# Patient Record
Sex: Female | Born: 1965 | Race: Black or African American | Hispanic: No | Marital: Married | Smoking: Never smoker
Health system: Southern US, Community
[De-identification: ages and names within clinical notes are randomized; demographics above are authoritative.]

## PROBLEM LIST (undated history)

## (undated) DIAGNOSIS — Z8711 Personal history of peptic ulcer disease: Secondary | ICD-10-CM

## (undated) DIAGNOSIS — M1990 Unspecified osteoarthritis, unspecified site: Secondary | ICD-10-CM

## (undated) DIAGNOSIS — K5900 Constipation, unspecified: Secondary | ICD-10-CM

## (undated) DIAGNOSIS — Z91018 Allergy to other foods: Secondary | ICD-10-CM

## (undated) DIAGNOSIS — Z460 Encounter for fitting and adjustment of spectacles and contact lenses: Secondary | ICD-10-CM

## (undated) DIAGNOSIS — Z9109 Other allergy status, other than to drugs and biological substances: Secondary | ICD-10-CM

## (undated) DIAGNOSIS — J45909 Unspecified asthma, uncomplicated: Secondary | ICD-10-CM

## (undated) DIAGNOSIS — I319 Disease of pericardium, unspecified: Secondary | ICD-10-CM

## (undated) DIAGNOSIS — Z8679 Personal history of other diseases of the circulatory system: Secondary | ICD-10-CM

## (undated) DIAGNOSIS — E739 Lactose intolerance, unspecified: Secondary | ICD-10-CM

## (undated) DIAGNOSIS — K829 Disease of gallbladder, unspecified: Secondary | ICD-10-CM

## (undated) DIAGNOSIS — M75100 Unspecified rotator cuff tear or rupture of unspecified shoulder, not specified as traumatic: Secondary | ICD-10-CM

## (undated) DIAGNOSIS — J302 Other seasonal allergic rhinitis: Secondary | ICD-10-CM

## (undated) DIAGNOSIS — M2550 Pain in unspecified joint: Secondary | ICD-10-CM

## (undated) DIAGNOSIS — S83209A Unspecified tear of unspecified meniscus, current injury, unspecified knee, initial encounter: Secondary | ICD-10-CM

## (undated) DIAGNOSIS — Z8719 Personal history of other diseases of the digestive system: Secondary | ICD-10-CM

## (undated) DIAGNOSIS — M1711 Unilateral primary osteoarthritis, right knee: Secondary | ICD-10-CM

## (undated) HISTORY — DX: Personal history of other diseases of the digestive system: Z87.19

## (undated) HISTORY — DX: Unspecified rotator cuff tear or rupture of unspecified shoulder, not specified as traumatic: M75.100

## (undated) HISTORY — PX: OTHER SURGICAL HISTORY: SHX169

## (undated) HISTORY — PX: BREAST BIOPSY: SHX20

## (undated) HISTORY — DX: Disease of gallbladder, unspecified: K82.9

## (undated) HISTORY — DX: Personal history of peptic ulcer disease: Z87.11

## (undated) HISTORY — DX: Lactose intolerance, unspecified: E73.9

## (undated) HISTORY — DX: Constipation, unspecified: K59.00

## (undated) HISTORY — DX: Allergy to other foods: Z91.018

## (undated) HISTORY — PX: REDUCTION MAMMAPLASTY: SUR839

## (undated) HISTORY — DX: Unspecified asthma, uncomplicated: J45.909

## (undated) HISTORY — DX: Unspecified osteoarthritis, unspecified site: M19.90

## (undated) HISTORY — DX: Pain in unspecified joint: M25.50

## (undated) HISTORY — DX: Disease of pericardium, unspecified: I31.9

---

## 1990-02-24 HISTORY — PX: CHOLECYSTECTOMY: SHX55

## 1996-02-25 HISTORY — PX: APPENDECTOMY: SHX54

## 1997-09-30 ENCOUNTER — Emergency Department (HOSPITAL_COMMUNITY): Admission: EM | Admit: 1997-09-30 | Discharge: 1997-09-30 | Payer: Self-pay | Admitting: Emergency Medicine

## 1998-03-14 ENCOUNTER — Encounter: Admission: RE | Admit: 1998-03-14 | Discharge: 1998-06-12 | Payer: Self-pay

## 1998-06-12 ENCOUNTER — Ambulatory Visit (HOSPITAL_COMMUNITY): Admission: RE | Admit: 1998-06-12 | Discharge: 1998-06-12 | Payer: Self-pay | Admitting: Internal Medicine

## 1998-06-12 ENCOUNTER — Encounter: Payer: Self-pay | Admitting: Internal Medicine

## 1998-06-26 ENCOUNTER — Encounter
Admission: RE | Admit: 1998-06-26 | Discharge: 1998-08-10 | Payer: Self-pay | Admitting: Physical Medicine & Rehabilitation

## 1999-03-13 ENCOUNTER — Other Ambulatory Visit: Admission: RE | Admit: 1999-03-13 | Discharge: 1999-03-13 | Payer: Self-pay | Admitting: Obstetrics & Gynecology

## 1999-03-18 ENCOUNTER — Inpatient Hospital Stay (HOSPITAL_COMMUNITY): Admission: RE | Admit: 1999-03-18 | Discharge: 1999-03-20 | Payer: Self-pay | Admitting: Obstetrics & Gynecology

## 1999-03-18 ENCOUNTER — Encounter (INDEPENDENT_AMBULATORY_CARE_PROVIDER_SITE_OTHER): Payer: Self-pay | Admitting: Specialist

## 1999-03-18 HISTORY — PX: VAGINAL HYSTERECTOMY: SUR661

## 2000-04-17 ENCOUNTER — Other Ambulatory Visit: Admission: RE | Admit: 2000-04-17 | Discharge: 2000-04-17 | Payer: Self-pay | Admitting: Internal Medicine

## 2000-05-19 ENCOUNTER — Other Ambulatory Visit: Admission: RE | Admit: 2000-05-19 | Discharge: 2000-05-19 | Payer: Self-pay | Admitting: Obstetrics & Gynecology

## 2000-07-15 ENCOUNTER — Encounter: Payer: Self-pay | Admitting: Occupational Medicine

## 2000-07-15 ENCOUNTER — Encounter: Admission: RE | Admit: 2000-07-15 | Discharge: 2000-07-15 | Payer: Self-pay | Admitting: Occupational Medicine

## 2001-09-20 ENCOUNTER — Encounter: Admission: RE | Admit: 2001-09-20 | Discharge: 2001-09-20 | Payer: Self-pay | Admitting: Internal Medicine

## 2001-09-20 ENCOUNTER — Encounter: Payer: Self-pay | Admitting: Internal Medicine

## 2001-09-24 ENCOUNTER — Encounter (INDEPENDENT_AMBULATORY_CARE_PROVIDER_SITE_OTHER): Payer: Self-pay | Admitting: *Deleted

## 2001-09-24 ENCOUNTER — Encounter: Payer: Self-pay | Admitting: Internal Medicine

## 2001-09-24 ENCOUNTER — Encounter: Admission: RE | Admit: 2001-09-24 | Discharge: 2001-09-24 | Payer: Self-pay | Admitting: Internal Medicine

## 2001-11-04 ENCOUNTER — Emergency Department (HOSPITAL_COMMUNITY): Admission: EM | Admit: 2001-11-04 | Discharge: 2001-11-04 | Payer: Self-pay | Admitting: Emergency Medicine

## 2001-11-05 ENCOUNTER — Other Ambulatory Visit: Admission: RE | Admit: 2001-11-05 | Discharge: 2001-11-05 | Payer: Self-pay | Admitting: Obstetrics & Gynecology

## 2002-10-11 ENCOUNTER — Encounter: Admission: RE | Admit: 2002-10-11 | Discharge: 2002-10-27 | Payer: Self-pay | Admitting: Orthopaedic Surgery

## 2003-01-23 ENCOUNTER — Other Ambulatory Visit: Admission: RE | Admit: 2003-01-23 | Discharge: 2003-01-23 | Payer: Self-pay | Admitting: Obstetrics & Gynecology

## 2003-01-27 ENCOUNTER — Encounter: Admission: RE | Admit: 2003-01-27 | Discharge: 2003-01-27 | Payer: Self-pay | Admitting: Occupational Medicine

## 2003-10-16 ENCOUNTER — Emergency Department (HOSPITAL_COMMUNITY): Admission: EM | Admit: 2003-10-16 | Discharge: 2003-10-16 | Payer: Self-pay | Admitting: Emergency Medicine

## 2004-03-21 ENCOUNTER — Encounter: Admission: RE | Admit: 2004-03-21 | Discharge: 2004-03-21 | Payer: Self-pay | Admitting: Internal Medicine

## 2004-06-14 ENCOUNTER — Emergency Department (HOSPITAL_COMMUNITY): Admission: EM | Admit: 2004-06-14 | Discharge: 2004-06-14 | Payer: Self-pay | Admitting: Emergency Medicine

## 2004-10-08 ENCOUNTER — Ambulatory Visit (HOSPITAL_COMMUNITY): Admission: RE | Admit: 2004-10-08 | Discharge: 2004-10-08 | Payer: Self-pay | Admitting: Family Medicine

## 2006-02-16 ENCOUNTER — Encounter: Admission: RE | Admit: 2006-02-16 | Discharge: 2006-02-16 | Payer: Self-pay | Admitting: Internal Medicine

## 2007-01-28 ENCOUNTER — Encounter: Admission: RE | Admit: 2007-01-28 | Discharge: 2007-01-28 | Payer: Self-pay | Admitting: Internal Medicine

## 2009-04-22 ENCOUNTER — Ambulatory Visit (HOSPITAL_COMMUNITY)
Admission: RE | Admit: 2009-04-22 | Discharge: 2009-04-22 | Payer: Self-pay | Source: Home / Self Care | Admitting: Family Medicine

## 2009-04-26 ENCOUNTER — Observation Stay (HOSPITAL_COMMUNITY): Admission: EM | Admit: 2009-04-26 | Discharge: 2009-04-27 | Payer: Self-pay | Admitting: Emergency Medicine

## 2009-04-26 ENCOUNTER — Encounter: Payer: Self-pay | Admitting: Family Medicine

## 2009-04-26 ENCOUNTER — Ambulatory Visit: Payer: Self-pay | Admitting: Family Medicine

## 2009-04-26 HISTORY — PX: TRANSTHORACIC ECHOCARDIOGRAM: SHX275

## 2010-03-17 ENCOUNTER — Encounter: Payer: Self-pay | Admitting: Orthopedic Surgery

## 2010-05-20 LAB — COMPREHENSIVE METABOLIC PANEL
ALT: 24 U/L (ref 0–35)
Alkaline Phosphatase: 66 U/L (ref 39–117)
BUN: 13 mg/dL (ref 6–23)
CO2: 28 mEq/L (ref 19–32)
Chloride: 103 mEq/L (ref 96–112)
GFR calc Af Amer: >60 mL/min (ref >60)
GFR calc non Af Amer: >60 mL/min (ref >60)
Glucose, Bld: 88 mg/dL (ref 70–99)
Potassium: 4 mEq/L (ref 3.5–5.1)
Sodium: 137 mEq/L (ref 135–145)
Total Bilirubin: 0.6 mg/dL (ref 0.3–1.2)
Total Protein: 8.1 g/dL (ref 6.0–8.3)

## 2010-05-20 LAB — DIFFERENTIAL
Eosinophils Relative: 1 % (ref 0–5)
Lymphs Abs: 2.7 10*3/uL (ref 0.7–4.0)
Monocytes Relative: 5 % (ref 3–12)
Neutro Abs: 10.1 10*3/uL — ABNORMAL HIGH (ref 1.7–7.7)

## 2010-05-20 LAB — POCT CARDIAC MARKERS: CKMB, poc: <1 ng/mL — ABNORMAL LOW (ref 1.0–8.0)

## 2010-05-20 LAB — CK TOTAL AND CKMB (NOT AT ARMC)
CK, MB: 0.8 ng/mL (ref 0.3–4.0)
Total CK: 101 U/L (ref 7–177)

## 2010-05-20 LAB — CARDIAC PANEL(CRET KIN+CKTOT+MB+TROPI)
CK, MB: 0.6 ng/mL (ref 0.3–4.0)
CK, MB: 0.9 ng/mL (ref 0.3–4.0)
Total CK: 89 U/L (ref 7–177)
Troponin I: 0.01 ng/mL (ref 0.00–0.06)

## 2010-05-20 LAB — CBC
HCT: 38 % (ref 36.0–46.0)
MCHC: 34.4 g/dL (ref 30.0–36.0)
WBC: 13.6 10*3/uL — ABNORMAL HIGH (ref 4.0–10.5)

## 2010-05-20 LAB — TROPONIN I: Troponin I: 0.01 ng/mL (ref 0.00–0.06)

## 2010-05-20 LAB — SEDIMENTATION RATE: Sed Rate: 24 mm/hr — ABNORMAL HIGH (ref 0–22)

## 2010-07-12 NOTE — Op Note (Signed)
[facility]  Patient:    Dawn [name]                     MRN: [id number] Proc. Date: 03/18/99 Adm. Date:  [id number] Attending:  Lars Pinks                           Operative Report  PREOPERATIVE DIAGNOSIS:  Menorrhagia, dysmenorrhea, myoma uteri.  POSTOPERATIVE DIAGNOSIS:  Menorrhagia, dysmenorrhea, myoma uteri.  OPERATION:  Transvaginal hysterectomy.  SURGEON:  Richard D. Arlyce Dice, M.D.  ASSISTANT:  Luvenia Redden, M.D.  ANESTHESIA:  General endotracheal.  ESTIMATED BLOOD LOSS:  100 cc.  FINDINGS:  Myoma uteri.  INDICATIONS:  The patient is a 45 year old gravida 2, para 2 who has experienced approximately a one-year history of prolonged and painful periods which failed o respond to oral contraceptives or nonsteroidal anti-inflammatory drugs.  DESCRIPTION OF PROCEDURE:  The patient was taken to the operating room and placed in the supine position and general endotracheal anesthesia was induced.  She was then placed in dorsal lithotomy position.  The vagina and perineum were prepped and draped in sterile fashion.  A weighted speculum was placed in the vagina, the cervix was grasped with a Jacobs tenaculum and 0.5% Marcaine with 1:200,000 dilution of epinephrine was then administered in the paracervical tissues.  The  cervix was circumcised.  The mucosa was dissected free.  The anterior cul-de-sac was entered sharply.  The posterior cul-de-sac was defined but was not entered.  The uterosacral ligaments and the base of the cardinal ligaments were clamped, ut and ligated.  At this point the posterior cul-de-sac was entered easily and the  remainder of the cardinal ligaments were clamped, cut and ligated.  The inferior portion of the broad ligament was clamped, cut and ligated.  At this point the uterus was delivered posteriorly.  The remainder of the broad ligament and the utero-ovarian anastomosis was then clamped and  cut and doubly ligated bilaterally. The posterior cuff was closed with a running interlocking Vicryl 1 suture.  The  cul-de-sac was closed with a chromic suture.  The small areas of bleeding along the broad ligament were closed with figure-of-eight chromic sutures.  The peritoneum was closed with a pursestring suture.  The anterior vaginal cuff was closed with interrupted sutures.  At this point the operative field was dry and the procedure was terminated.  The bladder was catheterized and clear urine was obtained. DD:  03/18/99 TD:  03/19/99 Job: [id number]

## 2011-01-29 ENCOUNTER — Ambulatory Visit: Payer: Self-pay | Admitting: Family Medicine

## 2011-02-21 ENCOUNTER — Ambulatory Visit (INDEPENDENT_AMBULATORY_CARE_PROVIDER_SITE_OTHER): Payer: BC Managed Care – PPO | Admitting: Family Medicine

## 2011-02-21 DIAGNOSIS — N760 Acute vaginitis: Secondary | ICD-10-CM

## 2011-02-28 ENCOUNTER — Encounter (INDEPENDENT_AMBULATORY_CARE_PROVIDER_SITE_OTHER): Payer: BC Managed Care – PPO | Admitting: Family Medicine

## 2011-02-28 DIAGNOSIS — E669 Obesity, unspecified: Secondary | ICD-10-CM

## 2011-02-28 DIAGNOSIS — Z0000 Encounter for general adult medical examination without abnormal findings: Secondary | ICD-10-CM

## 2011-04-25 ENCOUNTER — Other Ambulatory Visit (HOSPITAL_COMMUNITY): Payer: Self-pay | Admitting: Orthopedic Surgery

## 2011-04-25 DIAGNOSIS — M25569 Pain in unspecified knee: Secondary | ICD-10-CM

## 2011-04-28 ENCOUNTER — Ambulatory Visit (HOSPITAL_COMMUNITY)
Admission: RE | Admit: 2011-04-28 | Discharge: 2011-04-28 | Disposition: A | Discharge status: Home / Self Care | Payer: 59 | Source: Ambulatory Visit | Attending: Orthopedic Surgery | Admitting: Orthopedic Surgery

## 2011-04-28 DIAGNOSIS — X58XXXA Exposure to other specified factors, initial encounter: Secondary | ICD-10-CM | POA: Insufficient documentation

## 2011-04-28 DIAGNOSIS — M1710 Unilateral primary osteoarthritis, unspecified knee: Secondary | ICD-10-CM | POA: Insufficient documentation

## 2011-04-28 DIAGNOSIS — M7989 Other specified soft tissue disorders: Secondary | ICD-10-CM | POA: Insufficient documentation

## 2011-04-28 DIAGNOSIS — S83289A Other tear of lateral meniscus, current injury, unspecified knee, initial encounter: Secondary | ICD-10-CM | POA: Insufficient documentation

## 2011-04-28 DIAGNOSIS — M25569 Pain in unspecified knee: Secondary | ICD-10-CM

## 2011-04-30 ENCOUNTER — Other Ambulatory Visit (HOSPITAL_COMMUNITY): Payer: Self-pay

## 2011-06-17 ENCOUNTER — Encounter (HOSPITAL_BASED_OUTPATIENT_CLINIC_OR_DEPARTMENT_OTHER): Payer: Self-pay | Admitting: *Deleted

## 2011-06-17 NOTE — Progress Notes (Signed)
NPO AFTER MN WITH EXCEPTION WATER/ GATORADE UNTIL  0700. ARRIVES AT 1100. NEEDS HG . MAY TAKE TYLENOL IF NEEDED W/ SIP OF WATER.

## 2011-06-20 ENCOUNTER — Encounter (HOSPITAL_BASED_OUTPATIENT_CLINIC_OR_DEPARTMENT_OTHER): Payer: Self-pay | Admitting: Anesthesiology

## 2011-06-20 ENCOUNTER — Ambulatory Visit (HOSPITAL_BASED_OUTPATIENT_CLINIC_OR_DEPARTMENT_OTHER)
Admission: RE | Admit: 2011-06-20 | Discharge: 2011-06-20 | Disposition: A | Discharge status: Home / Self Care | Payer: 59 | Source: Ambulatory Visit | Attending: Orthopedic Surgery | Admitting: Orthopedic Surgery

## 2011-06-20 ENCOUNTER — Encounter (HOSPITAL_BASED_OUTPATIENT_CLINIC_OR_DEPARTMENT_OTHER): Payer: Self-pay

## 2011-06-20 ENCOUNTER — Ambulatory Visit (HOSPITAL_BASED_OUTPATIENT_CLINIC_OR_DEPARTMENT_OTHER): Payer: 59 | Admitting: Anesthesiology

## 2011-06-20 ENCOUNTER — Encounter (HOSPITAL_BASED_OUTPATIENT_CLINIC_OR_DEPARTMENT_OTHER)
Admission: RE | Disposition: A | Discharge status: Home / Self Care | Payer: Self-pay | Source: Ambulatory Visit | Attending: Orthopedic Surgery

## 2011-06-20 DIAGNOSIS — Z79899 Other long term (current) drug therapy: Secondary | ICD-10-CM | POA: Insufficient documentation

## 2011-06-20 DIAGNOSIS — IMO0002 Reserved for concepts with insufficient information to code with codable children: Secondary | ICD-10-CM | POA: Insufficient documentation

## 2011-06-20 DIAGNOSIS — M2240 Chondromalacia patellae, unspecified knee: Secondary | ICD-10-CM | POA: Insufficient documentation

## 2011-06-20 DIAGNOSIS — X58XXXA Exposure to other specified factors, initial encounter: Secondary | ICD-10-CM | POA: Insufficient documentation

## 2011-06-20 DIAGNOSIS — Z8711 Personal history of peptic ulcer disease: Secondary | ICD-10-CM | POA: Insufficient documentation

## 2011-06-20 DIAGNOSIS — M6580 Other synovitis and tenosynovitis, unspecified site: Secondary | ICD-10-CM | POA: Insufficient documentation

## 2011-06-20 HISTORY — DX: Other allergy status, other than to drugs and biological substances: Z91.09

## 2011-06-20 HISTORY — DX: Personal history of peptic ulcer disease: Z87.11

## 2011-06-20 HISTORY — DX: Personal history of other diseases of the circulatory system: Z86.79

## 2011-06-20 HISTORY — PX: KNEE ARTHROSCOPY: SHX127

## 2011-06-20 HISTORY — DX: Personal history of other diseases of the digestive system: Z87.19

## 2011-06-20 HISTORY — DX: Other seasonal allergic rhinitis: J30.2

## 2011-06-20 HISTORY — DX: Unspecified tear of unspecified meniscus, current injury, unspecified knee, initial encounter: S83.209A

## 2011-06-20 LAB — POCT HEMOGLOBIN-HEMACUE: Hemoglobin: 13.6 g/dL (ref 12.0–15.0)

## 2011-06-20 SURGERY — ARTHROSCOPY, KNEE
Anesthesia: General | Site: Knee | Laterality: Left | Wound class: Clean

## 2011-06-20 MED ORDER — SODIUM CHLORIDE 0.9 % IR SOLN
Status: DC | PRN
  Administered 2011-06-20: 9000 mL

## 2011-06-20 MED ORDER — FENTANYL CITRATE 0.05 MG/ML IJ SOLN
25.0000 ug | INTRAMUSCULAR | Status: DC | PRN
  Administered 2011-06-20 (×2): 25 ug via INTRAVENOUS

## 2011-06-20 MED ORDER — ONDANSETRON HCL 4 MG/2ML IJ SOLN
INTRAMUSCULAR | Status: DC | PRN
  Administered 2011-06-20: 4 mg via INTRAVENOUS

## 2011-06-20 MED ORDER — MIDAZOLAM HCL 5 MG/5ML IJ SOLN
INTRAMUSCULAR | Status: DC | PRN
  Administered 2011-06-20: 2 mg via INTRAVENOUS

## 2011-06-20 MED ORDER — BUPIVACAINE-EPINEPHRINE 0.25% -1:200000 IJ SOLN
INTRAMUSCULAR | Status: DC | PRN
  Administered 2011-06-20: 30 mL

## 2011-06-20 MED ORDER — MELOXICAM 7.5 MG PO TABS: 7.5000 mg | ORAL_TABLET | Freq: Two times a day (BID) | ORAL | Status: DC | PRN

## 2011-06-20 MED ORDER — CHLORHEXIDINE GLUCONATE 4 % EX LIQD: 60.0000 mL | Freq: Once | CUTANEOUS | Status: DC

## 2011-06-20 MED ORDER — LACTATED RINGERS IV SOLN: INTRAVENOUS | Status: DC

## 2011-06-20 MED ORDER — PROPOFOL 10 MG/ML IV EMUL
INTRAVENOUS | Status: DC | PRN
  Administered 2011-06-20: 150 mg via INTRAVENOUS

## 2011-06-20 MED ORDER — CEFAZOLIN SODIUM 1-5 GM-% IV SOLN
1.0000 g | INTRAVENOUS | Status: AC
  Administered 2011-06-20: 1 g via INTRAVENOUS

## 2011-06-20 MED ORDER — ASCRIPTIN 325 MG PO TABS: 1.0000 | ORAL_TABLET | Freq: Every day | ORAL | Status: DC

## 2011-06-20 MED ORDER — METHOCARBAMOL 500 MG PO TABS: 500.0000 mg | ORAL_TABLET | Freq: Four times a day (QID) | ORAL | Status: AC | PRN

## 2011-06-20 MED ORDER — FENTANYL CITRATE 0.05 MG/ML IJ SOLN
INTRAMUSCULAR | Status: DC | PRN
  Administered 2011-06-20 (×5): 50 ug via INTRAVENOUS

## 2011-06-20 MED ORDER — KETOROLAC TROMETHAMINE 30 MG/ML IJ SOLN
INTRAMUSCULAR | Status: DC | PRN
  Administered 2011-06-20: 30 mg via INTRAVENOUS

## 2011-06-20 MED ORDER — PROMETHAZINE HCL 25 MG/ML IJ SOLN: 6.2500 mg | INTRAMUSCULAR | Status: DC | PRN

## 2011-06-20 MED ORDER — DEXAMETHASONE SODIUM PHOSPHATE 4 MG/ML IJ SOLN
INTRAMUSCULAR | Status: DC | PRN
  Administered 2011-06-20: 10 mg via INTRAVENOUS

## 2011-06-20 MED ORDER — HYDROCODONE-ACETAMINOPHEN 5-325 MG PO TABS: 1.0000 | ORAL_TABLET | Freq: Four times a day (QID) | ORAL | Status: AC | PRN

## 2011-06-20 MED ORDER — LACTATED RINGERS IV SOLN
INTRAVENOUS | Status: DC
  Administered 2011-06-20 (×2): via INTRAVENOUS

## 2011-06-20 MED ORDER — LIDOCAINE-EPINEPHRINE (PF) 1 %-1:200000 IJ SOLN
INTRAMUSCULAR | Status: DC | PRN
  Administered 2011-06-20: 15 mL

## 2011-06-20 MED ORDER — LIDOCAINE HCL (CARDIAC) 20 MG/ML IV SOLN
INTRAVENOUS | Status: DC | PRN
  Administered 2011-06-20: 50 mg via INTRAVENOUS

## 2011-06-20 SURGICAL SUPPLY — 28 items
BANDAGE ELASTIC 6 VELCRO ST LF (GAUZE/BANDAGES/DRESSINGS) ×2 IMPLANT
BLADE 4.2CUDA (BLADE) IMPLANT
BLADE CUDA SHAVER 3.5 (BLADE) ×2 IMPLANT
CANISTER SUCT LVC 12 LTR MEDI- (MISCELLANEOUS) ×2 IMPLANT
CANISTER SUCTION 2500CC (MISCELLANEOUS) ×2 IMPLANT
CLOTH BEACON ORANGE TIMEOUT ST (SAFETY) ×2 IMPLANT
DRAPE ARTHROSCOPY W/POUCH 114 (DRAPES) ×2 IMPLANT
DRSG EMULSION OIL 3X3 NADH (GAUZE/BANDAGES/DRESSINGS) ×2 IMPLANT
DRSG PAD ABDOMINAL 8X10 ST (GAUZE/BANDAGES/DRESSINGS) ×1 IMPLANT
DURAPREP 26ML APPLICATOR (WOUND CARE) ×2 IMPLANT
ELECT MENISCUS 165MM 90D (ELECTRODE) IMPLANT
ELECT REM PT RETURN 9FT ADLT (ELECTROSURGICAL)
ELECTRODE REM PT RTRN 9FT ADLT (ELECTROSURGICAL) IMPLANT
GLOVE BIO SURGEON STRL SZ7.5 (GLOVE) ×2 IMPLANT
GOWN PREVENTION PLUS LG XLONG (DISPOSABLE) ×3 IMPLANT
IV NS IRRIG 3000ML ARTHROMATIC (IV SOLUTION) ×3 IMPLANT
KNEE WRAP E Z 3 GEL PACK (MISCELLANEOUS) ×2 IMPLANT
PACK ARTHROSCOPY DSU (CUSTOM PROCEDURE TRAY) ×2 IMPLANT
PACK BASIN DAY SURGERY FS (CUSTOM PROCEDURE TRAY) ×2 IMPLANT
PENCIL BUTTON HOLSTER BLD 10FT (ELECTRODE) IMPLANT
SET ARTHROSCOPY TUBING (MISCELLANEOUS) ×2
SET ARTHROSCOPY TUBING LN (MISCELLANEOUS) ×1 IMPLANT
SPONGE GAUZE 4X4 12PLY (GAUZE/BANDAGES/DRESSINGS) ×2 IMPLANT
SUT ETHILON 4 0 PS 2 18 (SUTURE) ×2 IMPLANT
SYR CONTROL 10ML LL (SYRINGE) ×2 IMPLANT
TOWEL OR 17X24 6PK STRL BLUE (TOWEL DISPOSABLE) ×2 IMPLANT
WAND 90 DEG TURBOVAC W/CORD (SURGICAL WAND) ×1 IMPLANT
WATER STERILE IRR 500ML POUR (IV SOLUTION) ×2 IMPLANT

## 2011-06-20 NOTE — Anesthesia Procedure Notes (Signed)
Procedure Name: LMA Insertion Date/Time: 06/20/2011 1:12 PM Performed by: Norva Pavlov Pre-anesthesia Checklist: Patient identified, Emergency Drugs available, Suction available and Patient being monitored Patient Re-evaluated:Patient Re-evaluated prior to inductionOxygen Delivery Method: Circle System Utilized Preoxygenation: Pre-oxygenation with 100% oxygen Intubation Type: IV induction Ventilation: Mask ventilation without difficulty LMA: LMA inserted LMA Size: 4.0 Number of attempts: 1 Airway Equipment and Method: bite block Placement Confirmation: positive ETCO2 Tube secured with: Tape Dental Injury: Teeth and Oropharynx as per pre-operative assessment

## 2011-06-20 NOTE — H&P (Signed)
  CC- TERILYNN BURESH is a 46 y.o. female who presents with left knee pain.  HPI- . Knee Pain: Patient presents with knee pain involving the  left knee. Onset of the symptoms was several months ago. Inciting event: twisting injury while doing normal activities. Current symptoms include giving out, locking and swelling. Pain is aggravated by going up and down stairs, lateral movements and walking.  Patient has had no prior knee problems. Evaluation to date: plain films: abnormal with moild to moderate medial and patellofemoral joint space narrowing and MRI: abnormal with medial mensical tearing and degenerative chagnes worse in themedial and patellofemoral compartments. Treatment to date: avoidance of offending activity, corticosteroid injection which was not very effective and prescription NSAIDS which are not very effective.  Past Medical History  Diagnosis Date  . Acute meniscal tear of knee LEFT  . Seasonal allergies   . Environmental allergies   . History of gastric ulcer AS TEEN  . 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 Vaginal hysterectomy 03-18-1999  . Cholecystectomy 1992  . Appendectomy 1998  . Bilateral carpal tunnel release 1980'S  . Transthoracic echocardiogram 04-26-2009    NORMAL LVSF/ EF 60-65% / LVDF NORMAL / NO PERICARDIAL EFFUSION    Prior to Admission medications   Medication Sig Start Date End Date Taking? Authorizing Provider  fexofenadine-pseudoephedrine (ALLEGRA-D 24) 180-240 MG per 24 hr tablet Take 1 tablet by mouth daily.   Yes Historical Provider, MD  meloxicam (MOBIC) 7.5 MG tablet Take 7.5 mg by mouth daily.   Yes Historical Provider, MD  acetaminophen (TYLENOL) 650 MG CR tablet Take 650 mg by mouth every 8 (eight) hours as needed.    Historical Provider, MD    soft tissue tenderness over the medial joint line worse than laterally, effusion, reduced  range of motion, collateral ligaments intact  Physical Examination: General appearance - alert, well appearing, and in no distress Chest - clear to auscultation, no wheezes, rales or rhonchi, symmetric air entry Heart - normal rate, regular rhythm, normal S1, S2, no murmurs, rubs, clicks or gallops, normal rate and regular rhythm Musculoskeletal - no joint tenderness, deformity or swelling, abnormal exam of left knee as noted from presentation, medial JLT, effusion Extremities - peripheral pulses normal, no pedal edema, no clubbing or cyanosis   Asessment/Plan--- Left knee medial meniscal tear- - Plan left knee arthroscopy with meniscal debridement. Procedure risks and potential comps discussed with patient who elects to proceed. Goals are decreased pain and increased function with a high likelihood of achieving both

## 2011-06-20 NOTE — Anesthesia Postprocedure Evaluation (Signed)
  Anesthesia Post-op Note  Patient: Dawn [name]  Procedure(s) Performed: Procedure(s) (LRB): ARTHROSCOPY KNEE (Left)  Patient Location: PACU  Anesthesia Type: General  Level of Consciousness: awake and alert   Airway and Oxygen Therapy: Patient Spontanous Breathing  Post-op Pain: mild  Post-op Assessment: Post-op Vital signs reviewed, Patient's Cardiovascular Status Stable, Respiratory Function Stable, Patent Airway and No signs of Nausea or vomiting  Post-op Vital Signs: stable  Complications: No apparent anesthesia complications

## 2011-06-20 NOTE — Anesthesia Preprocedure Evaluation (Addendum)
Anesthesia Evaluation  Patient identified by MRN, date of birth, ID band Patient awake    Reviewed: Allergy & Precautions, H&P , NPO status , Patient's Chart, lab work & pertinent test results  Airway Mallampati: II TM Distance: >3 FB Neck ROM: Full    Dental No notable dental hx. (+) Dental Advisory Given and Teeth Intact   Pulmonary neg pulmonary ROS,  breath sounds clear to auscultation  Pulmonary exam normal       Cardiovascular negative cardio ROS  Rhythm:Regular Rate:Normal     Neuro/Psych negative neurological ROS  negative psych ROS   GI/Hepatic negative GI ROS, Neg liver ROS,   Endo/Other  negative endocrine ROS  Renal/GU negative Renal ROS  negative genitourinary   Musculoskeletal negative musculoskeletal ROS (+)   Abdominal   Peds  Hematology negative hematology ROS (+)   Anesthesia Other Findings   Reproductive/Obstetrics negative OB ROS                          Anesthesia Physical Anesthesia Plan  ASA: I  Anesthesia Plan: General   Post-op Pain Management:    Induction: Intravenous  Airway Management Planned: LMA  Additional Equipment:   Intra-op Plan:   Post-operative Plan: Extubation in OR  Informed Consent: I have reviewed the patients History and Physical, chart, labs and discussed the procedure including the risks, benefits and alternatives for the proposed anesthesia with the patient or authorized representative who has indicated his/her understanding and acceptance.   Dental advisory given  Plan Discussed with: CRNA  Anesthesia Plan Comments:         Anesthesia Quick Evaluation

## 2011-06-20 NOTE — Transfer of Care (Signed)
Immediate Anesthesia Transfer of Care Note  Patient: Dawn [name]  Procedure(s) Performed: Procedure(s) (LRB): ARTHROSCOPY KNEE (Left)  Patient Location: PACU  Anesthesia Type: General  Level of Consciousness: awake, alert  and oriented  Airway & Oxygen Therapy: Patient Spontanous Breathing and Patient connected to face mask oxygen  Post-op Assessment: Report given to PACU RN and Post -op Vital signs reviewed and stable  Post vital signs: Reviewed and stable  Complications: No apparent anesthesia complications

## 2011-06-20 NOTE — Discharge Instructions (Addendum)
Maintain surgical wrap for about 2 days to help reduce swelling then may remove and use ACE wrap as needed for swelling.  Keep wounds dry until follow up  Ice for 20-30 min per hour for pain and swelling as needed particularly for the first 48-72 hours. Post Anesthesia Home Care Instructions  Activity: Get plenty of rest for the remainder of the day. A responsible adult should stay with you for 24 hours following the procedure.  For the next 24 hours, DO NOT: -Drive a car -Advertising copywriter -Drink alcoholic beverages -Take any medication unless instructed by your physician -Make any legal decisions or sign important papers.  Meals: Start with liquid foods such as gelatin or soup. Progress to regular foods as tolerated. Avoid greasy, spicy, heavy foods. If nausea and/or vomiting occur, drink only clear liquids until the nausea and/or vomiting subsides. Call your physician if vomiting continues.  Special Instructions/Symptoms: Your throat may feel dry or sore from the anesthesia or the breathing tube placed in your throat during surgery. If this causes discomfort, gargle with warm salt water. The discomfort should disappear within 24 hours.  Discharge Instructions After Orthopedic Procedures:  *You may feel tired and weak following your procedure. It is recommended that you limit physical activity for the next 24 hours and rest at home for the remainder of today and tomorrow. *No strenuous activity should be started without your doctor's permission.  Elevate the extremity that you had surgery on to a level above your heart. This should continue for 48 hours or as instructed by your doctor.  If you had hand, arm or shoulder surgery you should move your fingers frequently unless otherwise instructed by your doctor.  If you had foot, knee or leg surgery you should wiggle your toes frequently unless otherwise instructed by your doctor.  Follow your doctor's exact instructions for activity  at home. Use your home equipment as instructed. (Crutches, hard shoes, slings etc.)  Limit your activity as instructed by your doctor.  Report to your doctor should any of the following occur: 1. Extreme swelling of your fingers or toes. 2. Inability to wiggle your fingers or toes. 3. Coldness, pale or bluish color in your fingers or toes. 4. Loss of sensation, numbness or tingling of your fingers or toes. 5. Unusual smell or odor from under your dressing or cast. 6. Excessive bleeding or drainage from the surgical site. 7. Pain not relieved by medication your doctor has prescribed for you. 8. Cast or dressing too tight (do not get your dressing or cast wet or put anything under          your dressing or cast.)  *Do not change your dressing unless instructed by your doctor or discharge nurse. Then follow exact instructions.  *Follow labeled instructions for any medications that your doctor may have prescribed for you. *Should any questions or complications develop following your procedure, PLEASE CONTACT YOUR DOCTOR.

## 2011-06-20 NOTE — Brief Op Note (Signed)
06/20/2011  [facility] surgical center  2:00 PM  PATIENT:  Dawn [name]  46 y.o. female  PRE-OPERATIVE DIAGNOSIS:  left knee meniscal tear  POST-OPERATIVE DIAGNOSIS:  1. left knee medial meniscal tear 2. Grade II thinning of medial compartment cartilage, 3. Grade III-IV chondromalacia PF compartment, 4. Grade III lateral tibial, 5. synovitis  PROCEDURE:  Procedure(s) (LRB): ARTHROSCOPY KNEE (Left), medial partial meniscectomy, 2. Lateral and PF chondroplasty with abrasion lateral trochlea, 3. Synovectomy, limited, 4. Lateral release  SURGEON:  Surgeon(s) and Role:    * Shelda Pal, MD - Primary  PHYSICIAN ASSISTANT: none  ANESTHESIA:   general  EBL:  Total I/O In: 100 [I.V.:100] Out: -   BLOOD ADMINISTERED:none  DRAINS: none   LOCAL MEDICATIONS USED:  MARCAINE  25cc with epi  and XYLOCAINE 15cc portal sites  SPECIMEN:  No Specimen  DISPOSITION OF SPECIMEN:  N/A  COUNTS:  YES  TOURNIQUET:  * No tourniquets in log *  DICTATION: .Other Dictation: Dictation Number [id number]  PLAN OF CARE: Discharge to home after PACU  PATIENT DISPOSITION:  PACU - hemodynamically stable.   Delay start of Pharmacological VTE agent (>24hrs) due to surgical blood loss or risk of bleeding: not applicable

## 2011-06-21 NOTE — Op Note (Signed)
[name], [name]           ACCOUNT NO.:  [id number]  MEDICAL RECORD NO.:  [id number]  LOCATION:                                 FACILITY:  PHYSICIAN:  Dawn Frankel. Charlann [name], M.D.  DATE OF BIRTH:  29-Jul-1965  DATE OF PROCEDURE:  06/20/2011 DATE OF DISCHARGE:                              OPERATIVE REPORT   PREOPERATIVE DIAGNOSIS:  Left knee medial meniscal tear associated with chondromalacia in the patellofemoral medial compartment.  POSTOPERATIVE DIAGNOSIS/FINDINGS: 1. Complex tear into the posterior horn to midbody region of medial     meniscus. 2. Grade 2 chondral thinning in the medial compartment and     predominantly in the tibial plateau service. 3. Grade 3 chondromalacia lateral to the plateau. 4. Grade 3-4 chondromalacia on the patellofemoral compartment     particularly involving lateral patella and lateral femoral     trochlea. 5. Synovitis reactive.  PROCEDURE: 1. Left knee diagnostic and operative arthroscopy with partial medial     meniscectomy. 2. Patellofemoral and lateral compartment chondroplasty with abrasion     chondroplasty carried out in the patellofemoral compartment along     lateral trochlea. 3. Limited synovectomy. 4. Lateral release.  SURGEON:  Dawn Frankel. Charlann [name], M.D.  ASSISTANT:  Surgical team.  ANESTHESIA:  General plus preoperative portal site injection with local plus a postoperative local of Marcaine with epinephrine.  SPECIMENS:  None.  DRAINS:  None.  COMPLICATIONS:  None.  BLOOD LOSS:  None.  INDICATIONS FOR PROCEDURE:  Dawn [name] is a 46 year old female with left knee problems including medial meniscal tear by MRI.  She failed to respond to conservative measures, medications, activity modification, injection of cortisone.  Given the persistence of her current problem and given these findings predominantly of the medial meniscal issue, she wished to proceed with more definitive measures.  Risks of progression of disease,  persistent discomfort related to patellofemoral arthritis, infection, DVT, recurrence of pathology were all discussed, reviewed. Consent was obtained for benefit of pain relief.  PROCEDURE IN DETAIL:  The patient was brought to the operative theater. Once adequate anesthesia, preop antibiotics, Ancef administered, she was positioned supine with the left leg in a leg holder.  The left lower extremity was then prepped and draped in a sterile fashion.  Time-out was performed identifying the patient, planned procedure and extremity.  The standard inferior medial, superior medial, and inferior lateral portals were utilized.  Diagnostic evaluation of the knee revealed the above findings.  Attention was first directed to the medial compartment initially using the 3.5 Cuda shaver.  A large portion of the tear was removed followed by the use of straight and up-biting baskets in order to debride the superior leaf of the tears noted on the posterior horn extending to the midbody region.  The 3.5 Cuda shaver was reintroduced for further removal of meniscal fragments as well as contouring of the remaining meniscus back to a stable level in the midbody junction.  She was noted to have chondral thinning, but no chondral fragments with significant debris in the medial compartment of the knee.  Laterally, she was noted to have these grade 3 changes of the tibial plateau surface.  There was minimum meniscal pathology.  I introduced a 3.5 Cuda shaver and debrided the lateral tibial plateau back to a stable level, noted at the medial aspect of the lateral compartment to be some fairly thin cartilage area.  No evidence of eburnated bone on the weightbearing surface laterally.  Minor meniscal debridement was carried out, but nothing that would be procedurally oriented.  Anteriorly, a fairly extensive but limited synovectomy of the anterior, medial and lateral aspect of the knee was carried out.  This  identified further advanced degenerative changes than what were expected by MRI, noting grade 3 predominant changes over the lateral aspect of the patella.  The lateral trochlear area, however, was grade 3- 4 with exposed bone, which was debrided back to a stable level and abraded with a 3.5 Cuda shaver with some punctate bleeding present.  In addition of this noting the lateral position of the patella, I went ahead and performed a lateral release inserting an ArthroCare wand in order to perform this laterally to prevent bleeding.  The 3.5 Cuda was reinserted to perform final debridements throughout the knee once I was satisfied there was no remaining meniscal fragments, any cartilage fragments and everything was back to a stable level. Following this re-examination, the instrumentation was removed from the knee.  The portal sites were reapproximated using 4-0 nylon.  I injected the knee at the end of the case with 0.25% Marcaine with epinephrine. The knee was wrapped into a sterile bulky Jones dressing.  She was then brought to the recovery room, extubated in stable condition, tolerating the procedure well.  I reviewed these findings with the family.  I will see her back in the office for routine followup in 10 days.  We will try to set her up with Physical Therapy for strengthening, particularly focus on VMO quad strengthening as well as perhaps patellar taping and bracing for functional activity.     Dawn [name], M.D.    MDO/MEDQ  D:  06/20/2011  T:  06/21/2011  Job:  [id number]

## 2011-06-23 ENCOUNTER — Encounter (HOSPITAL_BASED_OUTPATIENT_CLINIC_OR_DEPARTMENT_OTHER): Payer: Self-pay | Admitting: Orthopedic Surgery

## 2011-06-26 ENCOUNTER — Encounter (HOSPITAL_BASED_OUTPATIENT_CLINIC_OR_DEPARTMENT_OTHER): Payer: Self-pay

## 2011-07-03 ENCOUNTER — Ambulatory Visit: Payer: 59 | Attending: Orthopedic Surgery | Admitting: Physical Therapy

## 2011-07-03 DIAGNOSIS — M25569 Pain in unspecified knee: Secondary | ICD-10-CM | POA: Insufficient documentation

## 2011-07-03 DIAGNOSIS — IMO0001 Reserved for inherently not codable concepts without codable children: Secondary | ICD-10-CM | POA: Insufficient documentation

## 2011-07-03 DIAGNOSIS — M25669 Stiffness of unspecified knee, not elsewhere classified: Secondary | ICD-10-CM | POA: Insufficient documentation

## 2011-07-03 DIAGNOSIS — R269 Unspecified abnormalities of gait and mobility: Secondary | ICD-10-CM | POA: Insufficient documentation

## 2011-07-04 ENCOUNTER — Ambulatory Visit: Payer: 59 | Admitting: Physical Therapy

## 2011-07-07 ENCOUNTER — Ambulatory Visit: Payer: 59

## 2011-07-08 ENCOUNTER — Ambulatory Visit: Payer: 59 | Admitting: Physical Therapy

## 2011-07-09 ENCOUNTER — Ambulatory Visit: Payer: 59

## 2011-07-10 ENCOUNTER — Ambulatory Visit: Payer: 59 | Admitting: Physical Therapy

## 2011-07-14 ENCOUNTER — Encounter: Payer: 59 | Admitting: Physical Therapy

## 2011-07-14 ENCOUNTER — Ambulatory Visit: Payer: 59

## 2011-07-16 ENCOUNTER — Ambulatory Visit: Payer: 59

## 2011-07-16 ENCOUNTER — Encounter: Payer: 59 | Admitting: Physical Therapy

## 2011-07-18 ENCOUNTER — Ambulatory Visit: Payer: 59 | Admitting: Physical Therapy

## 2011-07-22 ENCOUNTER — Ambulatory Visit: Payer: 59 | Admitting: Physical Therapy

## 2011-07-24 ENCOUNTER — Encounter: Payer: 59 | Admitting: Physical Therapy

## 2011-07-28 ENCOUNTER — Ambulatory Visit: Payer: 59 | Attending: Orthopedic Surgery | Admitting: Physical Therapy

## 2011-07-28 DIAGNOSIS — IMO0001 Reserved for inherently not codable concepts without codable children: Secondary | ICD-10-CM | POA: Insufficient documentation

## 2011-07-28 DIAGNOSIS — R269 Unspecified abnormalities of gait and mobility: Secondary | ICD-10-CM | POA: Insufficient documentation

## 2011-07-28 DIAGNOSIS — M25569 Pain in unspecified knee: Secondary | ICD-10-CM | POA: Insufficient documentation

## 2011-07-28 DIAGNOSIS — M25669 Stiffness of unspecified knee, not elsewhere classified: Secondary | ICD-10-CM | POA: Insufficient documentation

## 2011-07-30 ENCOUNTER — Ambulatory Visit: Payer: 59 | Admitting: Physical Therapy

## 2011-07-31 ENCOUNTER — Ambulatory Visit: Payer: 59 | Admitting: Physical Therapy

## 2011-08-04 ENCOUNTER — Ambulatory Visit: Payer: 59 | Admitting: Physical Therapy

## 2011-08-06 ENCOUNTER — Ambulatory Visit: Payer: 59 | Admitting: Physical Therapy

## 2011-08-07 ENCOUNTER — Encounter: Payer: 59 | Admitting: Physical Therapy

## 2011-08-11 ENCOUNTER — Encounter: Payer: 59 | Admitting: Physical Therapy

## 2011-08-13 ENCOUNTER — Encounter: Payer: 59 | Admitting: Physical Therapy

## 2011-08-14 ENCOUNTER — Encounter: Payer: 59 | Admitting: Physical Therapy

## 2011-08-18 ENCOUNTER — Ambulatory Visit: Payer: 59

## 2011-08-20 ENCOUNTER — Ambulatory Visit: Payer: 59 | Admitting: Physical Therapy

## 2011-08-21 ENCOUNTER — Ambulatory Visit: Payer: 59 | Admitting: Physical Therapy

## 2011-11-14 ENCOUNTER — Ambulatory Visit: Payer: BC Managed Care – PPO | Admitting: Family Medicine

## 2011-11-19 ENCOUNTER — Ambulatory Visit (INDEPENDENT_AMBULATORY_CARE_PROVIDER_SITE_OTHER): Payer: 59 | Admitting: Family Medicine

## 2011-11-19 ENCOUNTER — Encounter: Payer: Self-pay | Admitting: Family Medicine

## 2011-11-19 VITALS — BP 125/75 | HR 77 | Temp 97.8°F | Resp 16 | Ht 63.5 in | Wt 196.0 lb

## 2011-11-19 DIAGNOSIS — E669 Obesity, unspecified: Secondary | ICD-10-CM

## 2011-11-19 NOTE — Patient Instructions (Addendum)
[facility]-   [address]    [phone]. [facility]-   [id number]  [phone]  I would suggest you contact these centers to see if they may serve your needs re: weight loss.

## 2011-11-25 ENCOUNTER — Encounter: Payer: Self-pay | Admitting: Family Medicine

## 2011-11-25 DIAGNOSIS — M1711 Unilateral primary osteoarthritis, right knee: Secondary | ICD-10-CM

## 2011-11-25 DIAGNOSIS — J309 Allergic rhinitis, unspecified: Secondary | ICD-10-CM | POA: Insufficient documentation

## 2011-11-25 DIAGNOSIS — E669 Obesity, unspecified: Secondary | ICD-10-CM | POA: Insufficient documentation

## 2011-11-25 DIAGNOSIS — Z8679 Personal history of other diseases of the circulatory system: Secondary | ICD-10-CM | POA: Insufficient documentation

## 2011-11-25 HISTORY — DX: Unilateral primary osteoarthritis, right knee: M17.11

## 2011-11-25 NOTE — Progress Notes (Signed)
S: This 46 y.o. AA female is here to discuss weight loss options; she would like to be prescribed weight loss medication "to kick-start" her weight loss effort. She has lost some weight but feels frustrated by lack of progress. She just recently joined a weight loss program and plans to get started with regular exercise. Current activity is limited by joint problems (knees). Otherwise, she denies CP or tightness, cough or SOB, fatigue, palpitations, HA, dizziness or lightheadedness, weakness or syncope.  ROS: As per HPI.  O: Filed Vitals:   11/19/11 1121  BP: 125/75                                         BMI=34.1  Pulse: 77  Temp: 97.8 F (36.6 C)  Resp: 16   GEN: In NAD: WN,WD. HENT: [place]/AT; EOMI with clear conj/sclerae. COR: RRR; no edema. LUNGS: Normal resp rate and effort. NEURO: A&O x 3; CNs intact; otherwise, nonfocal.  A/P: 1. Obesity (BMI 30.0-34.9)    Advised pt that this provider does not prescribe weight loss medications and referred her to [facility] if she is determined to use these products but wants an M.D.'s supervision. I encouraged her to commit to the weight loss program that she will be pursuing and to commit to an exercise program (consider aquatic exercise given her joint discomfort). She understands and will contact clinic for more information.

## 2011-12-22 ENCOUNTER — Ambulatory Visit (INDEPENDENT_AMBULATORY_CARE_PROVIDER_SITE_OTHER): Payer: 59 | Admitting: Family Medicine

## 2011-12-22 ENCOUNTER — Ambulatory Visit: Payer: 59

## 2011-12-22 VITALS — BP 158/80 | HR 104 | Resp 28 | Ht 64.0 in | Wt 185.0 lb

## 2011-12-22 DIAGNOSIS — J45909 Unspecified asthma, uncomplicated: Secondary | ICD-10-CM

## 2011-12-22 DIAGNOSIS — R062 Wheezing: Secondary | ICD-10-CM

## 2011-12-22 DIAGNOSIS — R0602 Shortness of breath: Secondary | ICD-10-CM

## 2011-12-22 LAB — POCT CBC
Granulocyte percent: 70.8 %G (ref 37–80)
HCT, POC: 44.3 % (ref 37.7–47.9)
Hemoglobin: 13.6 g/dL (ref 12.2–16.2)
Lymph, poc: 2.4 (ref 0.6–3.4)
MCH, POC: 29.4 pg (ref 27–31.2)
MCHC: 30.7 g/dL — AB (ref 31.8–35.4)
MCV: 95.9 fL (ref 80–97)
MID (cbc): 0.6 (ref 0–0.9)
MPV: 9.7 fL (ref 0–99.8)
POC Granulocyte: 7.4 — AB (ref 2–6.9)
POC LYMPH PERCENT: 23.1 %L (ref 10–50)
POC MID %: 6.1 % (ref 0–12)
Platelet Count, POC: 366 10*3/uL (ref 142–424)
RBC: 4.62 M/uL (ref 4.04–5.48)
RDW, POC: 12.9 %
WBC: 10.5 10*3/uL — AB (ref 4.6–10.2)

## 2011-12-22 MED ORDER — ALBUTEROL SULFATE HFA 108 (90 BASE) MCG/ACT IN AERS: 2.0000 | INHALATION_SPRAY | RESPIRATORY_TRACT | Status: DC | PRN

## 2011-12-22 MED ORDER — IPRATROPIUM BROMIDE 0.02 % IN SOLN
0.5000 mg | Freq: Once | RESPIRATORY_TRACT | Status: AC
  Administered 2011-12-22: 0.5 mg via RESPIRATORY_TRACT

## 2011-12-22 MED ORDER — IPRATROPIUM BROMIDE 0.03 % NA SOLN: 2.0000 | Freq: Two times a day (BID) | NASAL | Status: DC

## 2011-12-22 MED ORDER — SODIUM CHLORIDE 0.9 % IV SOLN: 125.0000 mg | Freq: Once | INTRAVENOUS | Status: DC

## 2011-12-22 MED ORDER — ALBUTEROL SULFATE (2.5 MG/3ML) 0.083% IN NEBU
2.5000 mg | INHALATION_SOLUTION | Freq: Once | RESPIRATORY_TRACT | Status: AC
  Administered 2011-12-22: 2.5 mg via RESPIRATORY_TRACT

## 2011-12-22 MED ORDER — IPRATROPIUM-ALBUTEROL 18-103 MCG/ACT IN AERO: 2.0000 | INHALATION_SPRAY | Freq: Four times a day (QID) | RESPIRATORY_TRACT | Status: DC | PRN

## 2011-12-22 MED ORDER — METHYLPREDNISOLONE 4 MG PO KIT: PACK | ORAL | Status: DC

## 2011-12-22 MED ORDER — METHYLPREDNISOLONE SODIUM SUCC 125 MG IJ SOLR
125.0000 mg | Freq: Once | INTRAMUSCULAR | Status: AC
  Administered 2011-12-22: 125 mg via INTRAMUSCULAR

## 2011-12-22 MED ORDER — IPRATROPIUM-ALBUTEROL 20-100 MCG/ACT IN AERS: 1.0000 | INHALATION_SPRAY | Freq: Four times a day (QID) | RESPIRATORY_TRACT | Status: DC

## 2011-12-22 NOTE — Progress Notes (Signed)
 [facility]:  Office Visit  Chief Complaint:  Chief Complaint  Patient presents with  . Shortness of Breath    today/    HPI: Dawn [name] is a 46 y.o. female who complains of acute onset of SOB this AM while at work, she has a h/o asthma but has not been needing albuterol INH so does not carry it. Her asthma is usually chemically induced , this AM she smelled chlorine but that has not been a problem before. She noticed wheezing last night. She denies any CP just tightness in her chest. Has not tried anything for it here. Deneis numbness, tingling, diaphoresis or MI sxs. No URI sxs.  Past Medical History  Diagnosis Date  . Acute meniscal tear of knee LEFT  . Seasonal allergies   . Environmental allergies   . History of gastric ulcer AS TEEN  . 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 Vaginal hysterectomy 03-18-1999  . Cholecystectomy 1992  . Appendectomy 1998  . Bilateral carpal tunnel release 1980'S  . Transthoracic echocardiogram 04-26-2009    NORMAL LVSF/ EF 60-65% / LVDF NORMAL / NO PERICARDIAL EFFUSION  . Knee arthroscopy 06/20/2011    Procedure: ARTHROSCOPY KNEE;  Surgeon: Shelda Pal, MD;  Location: [facility];  Service: Orthopedics;  Laterality: ft;  left knee scope   latex allergy patient blisters and swells   History   Social History  . Marital Status: Married    Spouse Name: N/A    Number of Children: N/A  . Years of Education: N/A   Social History Main Topics  . Smoking status: Never Smoker   . Smokeless tobacco: Never Used  . Alcohol Use: No  . Drug Use: No  . Sexually Active:    Other Topics Concern  . None   Social History Narrative  . None   No family history on file. Allergies  Allergen Reactions  . Latex Swelling    AND BLISTERS  . Sulfa Antibiotics Anaphylaxis   Prior to Admission  medications   Medication Sig Start Date End Date Taking? Authorizing Provider  acetaminophen (TYLENOL) 650 MG CR tablet Take 650 mg by mouth every 8 (eight) hours as needed.   Yes Historical Provider, MD  albuterol (PROVENTIL HFA;VENTOLIN HFA) 108 (90 BASE) MCG/ACT inhaler Inhale 2 puffs into the lungs every 4 (four) hours as needed for wheezing (cough, shortness of breath or wheezing.). 12/22/11  Yes  P , DO  Aspirin Buf,AlHyd-MgHyd-CaCar, (ASCRIPTIN) 325 MG TABS Take 1 tablet by mouth daily. 06/20/11  Yes Shelda Pal, MD  fexofenadine-pseudoephedrine (ALLEGRA-D 24) 180-240 MG per 24 hr tablet Take 1 tablet by mouth daily.   Yes Historical Provider, MD  glucosamine-chondroitin 500-400 MG tablet Take 1 tablet by mouth 3 (three) times daily.   Yes Historical Provider, MD  meloxicam (MOBIC) 7.5 MG tablet Take 1 tablet (7.5 mg total) by mouth 2 (two) times daily as needed for pain. 06/20/11   Shelda Pal, MD     ROS: The patient denies fevers, chills, night sweats, unintentional weight loss, chest pain, palpitationn, nausea, vomiting, abdominal pain, dysuria, hematuria, melena, numbness, weakness, or tingling. + wheeze, + SOb, DOE  All other systems have been reviewed and were otherwise negative with the exception of those mentioned in the HPI and as above.    PHYSICAL EXAM: Filed Vitals:  12/22/11 1209  BP: 158/80  Pulse: 104  Resp: 28   Filed Vitals:   12/22/11 1209  Height: 5\' 4"  (1.626 m)  Weight: 185 lb (83.915 kg)   Body mass index is 31.76 kg/(m^2).  General: Alert,  acute distress HEENT:  Normocephalic, atraumatic, oropharynx patent.  Cardiovascular:  Regular rate and rhythm, no rubs murmurs or gallops.  No Carotid bruits, radial pulse intact. No pedal edema.  Respiratory: Clear to auscultation bilaterally.  No wheezes, rales, or rhonchi.  No cyanosis, + use of accessory musculature, increase WOB, + tight , distant BS GI: No organomegaly, abdomen is soft and  non-tender, positive bowel sounds.  No masses. Skin: No rashes. Neurologic: Facial musculature symmetric. Psychiatric: Patient is appropriate throughout our interaction. Lymphatic: No cervical lymphadenopathy Musculoskeletal: Gait intact.   LABS: Results for orders placed in visit on 12/22/11  POCT CBC      Component Value Range   WBC 10.5 (*) 4.6 - 10.2 K/uL   Lymph, poc 2.4  0.6 - 3.4   POC LYMPH PERCENT 23.1  10 - 50 %L   MID (cbc) 0.6  0 - 0.9   POC MID % 6.1  0 - 12 %M   POC Granulocyte 7.4 (*) 2 - 6.9   Granulocyte percent 70.8  37 - 80 %G   RBC 4.62  4.04 - 5.48 M/uL   Hemoglobin 13.6  12.2 - 16.2 g/dL   HCT, POC 96.0  45.4 - 47.9 %   MCV 95.9  80 - 97 fL   MCH, POC 29.4  27 - 31.2 pg   MCHC 30.7 (*) 31.8 - 35.4 g/dL   RDW, POC 09.8     Platelet Count, POC 366  142 - 424 K/uL   MPV 9.7  0 - 99.8 fL     EKG/XRAY:   Primary read interpreted by Dr. Conley Rolls at [facility]. No acute cardiopulmonary process   ASSESSMENT/PLAN: Encounter Diagnoses  Name Primary?  . Shortness of breath Yes  . Wheeze   . Asthma    Recheck after nebs and Solumedrol injection much improved, HR 71, Spo2 99% Asthma attack ? 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  Most likely related to weather changes.  Will rx Combivent and Medrol dose pack. If have CP, SOB or MI like sxs then go to ER prn F/u prn if sxs do not improve in Am. Currently she feels back to normal    ,  PHUONG, DO 12/22/2011 12:44 PM

## 2011-12-22 NOTE — Patient Instructions (Signed)
Asthma, Acute Bronchospasm  Your exam shows you have asthma, or acute bronchospasm that acts like asthma. Bronchospasm means your air passages become narrowed. These conditions are due to inflammation and airway spasm that cause narrowing of the bronchial tubes in the lungs. This causes you to have wheezing and shortness of breath.  CAUSES   Respiratory infections and allergies most often bring on these attacks. Smoking, air pollution, cold air, emotional upsets, and vigorous exercise can also bring them on.   TREATMENT    Treatment is aimed at making the narrowed airways larger. Mild asthma/bronchospasm is usually controlled with inhaled medicines. Albuterol is a common medicine that you breathe in to open spastic or narrowed airways. Some trade names for albuterol are Ventolin or Proventil. Steroid medicine is also used to reduce the inflammation when an attack is moderate or severe. Antibiotics (medications used to kill germs) are only used if a bacterial infection is present.   If you are pregnant and need to use Albuterol (Ventolin or Proventil), you can expect the baby to move more than usual shortly after the medicine is used.  HOME CARE INSTRUCTIONS    Rest.   Drink plenty of liquids. This helps the mucus to remain thin and easily coughed up. Do not use caffeine or alcohol.   Do not smoke. Avoid being exposed to second-hand smoke.   You play a critical role in keeping yourself in good health. Avoid exposure to things that cause you to wheeze. Avoid exposure to things that cause you to have breathing problems. Keep your medications up-to-date and available. Carefully follow your doctor's treatment plan.   When pollen or pollution is bad, keep windows closed and use an air conditioner go to places with air conditioning. If you are allergic to furry pets or birds, find new homes for them or keep them outside.   Take your medicine exactly as prescribed.   Asthma requires careful medical attention. See  your caregiver for follow-up as advised. If you are more than [redacted] weeks pregnant and you were prescribed any new medications, let your Obstetrician know about the visit and how you are doing. Arrange a recheck.  SEEK IMMEDIATE MEDICAL CARE IF:    You are getting worse.   You have trouble breathing. If severe, call 911.   You develop chest pain or discomfort.   You are throwing up or not drinking fluids.   You are not getting better within 24 hours.   You are coughing up yellow, green, brown, or bloody sputum.   You develop a fever over 102 F (38.9 C).   You have trouble swallowing.  MAKE SURE YOU:    Understand these instructions.   Will watch your condition.   Will get help right away if you are not doing well or get worse.  Document Released: 05/28/2006 Document Revised: 05/05/2011 Document Reviewed: 01/25/2007  [employer] Patient Information 2013 [employer].

## 2011-12-26 ENCOUNTER — Emergency Department (HOSPITAL_BASED_OUTPATIENT_CLINIC_OR_DEPARTMENT_OTHER)
Admission: EM | Admit: 2011-12-26 | Discharge: 2011-12-26 | Disposition: A | Discharge status: Home / Self Care | Payer: 59 | Attending: Emergency Medicine | Admitting: Emergency Medicine

## 2011-12-26 ENCOUNTER — Encounter (HOSPITAL_BASED_OUTPATIENT_CLINIC_OR_DEPARTMENT_OTHER): Payer: Self-pay

## 2011-12-26 DIAGNOSIS — Z8679 Personal history of other diseases of the circulatory system: Secondary | ICD-10-CM | POA: Insufficient documentation

## 2011-12-26 DIAGNOSIS — J309 Allergic rhinitis, unspecified: Secondary | ICD-10-CM | POA: Insufficient documentation

## 2011-12-26 DIAGNOSIS — Z79899 Other long term (current) drug therapy: Secondary | ICD-10-CM | POA: Insufficient documentation

## 2011-12-26 DIAGNOSIS — J45909 Unspecified asthma, uncomplicated: Secondary | ICD-10-CM

## 2011-12-26 DIAGNOSIS — Z8711 Personal history of peptic ulcer disease: Secondary | ICD-10-CM | POA: Insufficient documentation

## 2011-12-26 DIAGNOSIS — J45901 Unspecified asthma with (acute) exacerbation: Secondary | ICD-10-CM | POA: Insufficient documentation

## 2011-12-26 MED ORDER — ALBUTEROL SULFATE HFA 108 (90 BASE) MCG/ACT IN AERS: 1.0000 | INHALATION_SPRAY | Freq: Four times a day (QID) | RESPIRATORY_TRACT | Status: DC | PRN

## 2011-12-26 NOTE — ED Notes (Signed)
i spoke with our outpt pharmacy-states the system was having problems 10/28 with rxs

## 2011-12-26 NOTE — ED Provider Notes (Signed)
History     CSN: [id number]  Arrival date & time 12/26/11  1102   First MD Initiated Contact with Patient 12/26/11 1119      Chief Complaint  Patient presents with  . Shortness of Breath    (Consider location/radiation/quality/duration/timing/severity/associated sxs/prior treatment) Patient is a 46 y.o. female presenting with shortness of breath. The history is provided by the patient.  Shortness of Breath  Associated symptoms include shortness of breath and wheezing. Pertinent negatives include no chest pain and no fever.   patient the with a recent asthma attack does not have a distinct history of asthma but has had reactive airway disease in the past. Patient was seen at [facility] urgent care given nebulizer treatments however the albuterol was never electronically transferred. So she is currently only taking prednisone and using a Combivent inhaler. Patient continues to feel short of breath. When she first went to [facility] urgent care she was a little bit of respiratory distress and had to treat her with albuterol nebulizers that did help her and she did get better. Patient has not experienced similar distress to that degree since then.  Past Medical History  Diagnosis Date  . Acute meniscal tear of knee LEFT  . Seasonal allergies   . Environmental allergies   . History of gastric ulcer AS TEEN  . 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 Vaginal hysterectomy 03-18-1999  . Cholecystectomy 1992  . Appendectomy 1998  . Bilateral carpal tunnel release 1980'S  . Transthoracic echocardiogram 04-26-2009    NORMAL LVSF/ EF 60-65% / LVDF NORMAL / NO PERICARDIAL EFFUSION  . Knee arthroscopy 06/20/2011    Procedure: ARTHROSCOPY KNEE;  Surgeon: Shelda Pal, MD;  Location: [facility];  Service: Orthopedics;  Laterality: Left;  left knee scope   latex allergy patient  blisters and swells    No family history on file.  History  Substance Use Topics  . Smoking status: Never Smoker   . Smokeless tobacco: Never Used  . Alcohol Use: No    OB History    Grav Para Term Preterm Abortions TAB SAB Ect Mult Living                  Review of Systems  Constitutional: Negative for fever.  Eyes: Negative for redness.  Respiratory: Positive for shortness of breath and wheezing.   Cardiovascular: Negative for chest pain.  Gastrointestinal: Negative for abdominal pain.  Genitourinary: Negative for dysuria.  Musculoskeletal: Negative for back pain.  Skin: Negative for rash.  Neurological: Negative for headaches.  Hematological: Does not bruise/bleed easily.    Allergies  Latex and Sulfa antibiotics  Home Medications   Current Outpatient Rx  Name Route Sig Dispense Refill  . ACETAMINOPHEN ER 650 MG PO TBCR Oral Take 650 mg by mouth every 8 (eight) hours as needed.    . ALBUTEROL SULFATE HFA 108 (90 BASE) MCG/ACT IN AERS Inhalation Inhale 2 puffs into the lungs every 4 (four) hours as needed for wheezing (cough, shortness of breath or wheezing.). 1 Inhaler 1  . ALBUTEROL SULFATE HFA 108 (90 BASE) MCG/ACT IN AERS Inhalation Inhale 1-2 puffs into the lungs every 6 (six) hours as needed for wheezing. 1 Inhaler 2  . ASCRIPTIN 325 MG PO TABS Oral Take 1 tablet by mouth daily. 30 each 0    For 30 days  .  FEXOFENADINE-PSEUDOEPHED ER 180-240 MG PO TB24 Oral Take 1 tablet by mouth daily.    Marland Kitchen GLUCOSAMINE-CHONDROITIN 500-400 MG PO TABS Oral Take 1 tablet by mouth 3 (three) times daily.    . IPRATROPIUM-ALBUTEROL 20-100 MCG/ACT IN AERS Inhalation Inhale 1 puff into the lungs every 6 (six) hours. 1 Inhaler 1  . MELOXICAM 7.5 MG PO TABS Oral Take 1 tablet (7.5 mg total) by mouth 2 (two) times daily as needed for pain. 90 tablet 1  . METHYLPREDNISOLONE 4 MG PO KIT  follow package directions 21 tablet 0    BP 135/117  Pulse 72  Temp 98 F (36.7 C) (Oral)  Resp  18  SpO2 100%  Physical Exam  Nursing note and vitals reviewed. Constitutional: She is oriented to person, place, and time. She appears well-developed and well-nourished. No distress.  HENT:  Head: Normocephalic and atraumatic.  Mouth/Throat: Oropharynx is clear and moist.  Eyes: Conjunctivae normal and EOM are normal. Pupils are equal, round, and reactive to light.  Neck: Normal range of motion.  Cardiovascular: Normal rate, regular rhythm and normal heart sounds.   Pulmonary/Chest: Effort normal and breath sounds normal. No respiratory distress. She has no wheezes. She has no rales.       Bilateral rhonchi.  Musculoskeletal: Normal range of motion.  Neurological: She is alert and oriented to person, place, and time. No cranial nerve deficit. She exhibits normal muscle tone. Coordination normal.    ED Course  Procedures (including critical care time)  Labs Reviewed - No data to display No results found.   1. Reactive airway disease       MDM   Patient seen at [facility] urgent care there was a mixup in her albuterol prescription was not transferred to the pharmacy she is taking prednisone and is on a tapered dose pack. She is also using the Combivent inhaler. For which she most likely needs his albuterol inhaler. No wheezing here oxygen saturations are 100%. Symptoms are consistent with reactive airway disease.        Shelda Jakes, MD 12/26/11 1135

## 2011-12-26 NOTE — ED Notes (Signed)
When reviewed meds, pt states she was not given albuterol inhaler as prescribed in EPIC-pt does have medrol dose pack and combivent inhaler

## 2011-12-26 NOTE — ED Notes (Signed)
Pt reports she had "asthma attack" 5 days ago-was seen at [facility]-given neb tx-started on inhaler and prednisone-today pt was at work-started coughing then feeling sob-NAD at present

## 2012-01-02 ENCOUNTER — Ambulatory Visit (INDEPENDENT_AMBULATORY_CARE_PROVIDER_SITE_OTHER): Payer: 59 | Admitting: Family Medicine

## 2012-01-02 ENCOUNTER — Encounter: Payer: Self-pay | Admitting: Family Medicine

## 2012-01-02 VITALS — BP 128/74 | HR 85 | Temp 98.9°F | Resp 16 | Ht 64.0 in | Wt 184.0 lb

## 2012-01-02 DIAGNOSIS — J309 Allergic rhinitis, unspecified: Secondary | ICD-10-CM

## 2012-01-02 DIAGNOSIS — J9801 Acute bronchospasm: Secondary | ICD-10-CM

## 2012-01-02 MED ORDER — FLUTICASONE PROPIONATE 50 MCG/ACT NA SUSP: 2.0000 | Freq: Every day | NASAL | Status: DC

## 2012-01-02 NOTE — Patient Instructions (Signed)
Allergic Rhinitis Allergic rhinitis is when the mucous membranes in the nose respond to allergens. Allergens are particles in the air that cause your body to have an allergic reaction. This causes you to release allergic antibodies. Through a chain of events, these eventually cause you to release histamine into the blood stream (hence the use of antihistamines). Although meant to be protective to the body, it is this release that causes your discomfort, such as frequent sneezing, congestion and an itchy runny nose.  CAUSES  The pollen allergens may come from grasses, trees, and weeds. This is seasonal allergic rhinitis, or "hay fever." Other allergens cause year-round allergic rhinitis (perennial allergic rhinitis) such as house dust mite allergen, pet dander and mold spores.  SYMPTOMS   Nasal stuffiness (congestion).  Runny, itchy nose with sneezing and tearing of the eyes.  There is often an itching of the mouth, eyes and ears. It cannot be cured, but it can be controlled with medications. DIAGNOSIS  If you are unable to determine the offending allergen, skin or blood testing may find it. TREATMENT   Avoid the allergen.  Medications and allergy shots (immunotherapy) can help.  Hay fever may often be treated with antihistamines in pill or nasal spray forms. Antihistamines block the effects of histamine. There are over-the-counter medicines that may help with nasal congestion and swelling around the eyes. Check with your caregiver before taking or giving this medicine. If the treatment above does not work, there are many new medications your caregiver can prescribe. Stronger medications may be used if initial measures are ineffective. Desensitizing injections can be used if medications and avoidance fails. Desensitization is when a patient is given ongoing shots until the body becomes less sensitive to the allergen. Make sure you follow up with your caregiver if problems continue. SEEK MEDICAL  CARE IF:   You develop fever (more than 100.5 F (38.1 C).  You develop a cough that does not stop easily (persistent).  You have shortness of breath.  You start wheezing.  Symptoms interfere with normal daily activities. Document Released: 11/05/2000 Document Revised: 05/05/2011 Document Reviewed: 05/17/2008 [facility] Patient Information 2013 [place], [place].    Cough, Adult  A cough is a reflex that helps clear your throat and airways. It can help heal the body or may be a reaction to an irritated airway. A cough may only last 2 or 3 weeks (acute) or may last more than 8 weeks (chronic).  CAUSES Acute cough:  Viral or bacterial infections. Chronic cough:  Infections.  Allergies.  Asthma.  Post-nasal drip.  Smoking.  Heartburn or acid reflux.  Some medicines.  Chronic lung problems (COPD).  Cancer. SYMPTOMS   Cough.  Fever.  Chest pain.  Increased breathing rate.  High-pitched whistling sound when breathing (wheezing).  Colored mucus that you cough up (sputum). TREATMENT   A bacterial cough may be treated with antibiotic medicine.  A viral cough must run its course and will not respond to antibiotics.  Your caregiver may recommend other treatments if you have a chronic cough. HOME CARE INSTRUCTIONS   Only take over-the-counter or prescription medicines for pain, discomfort, or fever as directed by your caregiver. Use cough suppressants only as directed by your caregiver.  Use a cold steam vaporizer or humidifier in your bedroom or home to help loosen secretions.  Sleep in a semi-upright position if your cough is worse at night.  Rest as needed.  Stop smoking if you smoke. SEEK IMMEDIATE MEDICAL CARE IF:  You have pus in your sputum.  Your cough starts to worsen.  You cannot control your cough with suppressants and are losing sleep.  You begin coughing up blood.  You have difficulty breathing.  You develop pain which is getting  worse or is uncontrolled with medicine.  You have a fever. MAKE SURE YOU:   Understand these instructions.  Will watch your condition.  Will get help right away if you are not doing well or get worse. Document Released: 08/09/2010 Document Revised: 05/05/2011 Document Reviewed: 08/09/2010 [facility] Patient Information 2013 [place], [place].   Continue to use the saline nasal product every morning and use the steroid nasal spray ay bedtime. Take your OTC allergy medication every day.

## 2012-01-05 ENCOUNTER — Encounter: Payer: Self-pay | Admitting: Family Medicine

## 2012-01-05 NOTE — Progress Notes (Signed)
S:  Pt here for follow-up from 12/22/11 visit to [facility] for acute bronchospasms. Pt better for a few days then had recurrence of SOB and cough; thinks symptoms provoked by office cleaning and dust exposure. [facility]. She comes in to w/ chest tightness and cough. She denies nocturnal symptoms; seems to have most problems in afternoon. AT home, she has an air purifier and is using Albuterol MDI every 3-4 hours.    ROS: Negative for fever/ chills, n/v/d, wheezing, HA or sinus pain, dizziness or syncope. Positive for rhinorrhea and PND.  O:  Filed Vitals:   01/02/12 0913  BP: 128/74  Pulse: 85  Temp: 98.9 F (37.2 C)  Resp: 16   GEN: In NAD; WN,WD. HEENT: [place]/AT; EOMI w/ clear conj/scl. EACs and TMs normal. Post ph with erythema and mild cobblestoning. NECK: supple w/o LAN or TMG. LUNGS: CTA w/o rhonchi or wheezes. COR: RRR. NEURO: A&O x 3; CNs intact.   A/P: 1. Allergic rhinitis               Continue OTC Fexofenadine product; RX: Fluticasone  NS - Use hs  2. Cough due to bronchospasm    Continue to minimize exposure to riggers; Use Albuterol  MDI prn.

## 2012-02-06 ENCOUNTER — Ambulatory Visit: Payer: 59 | Admitting: Family Medicine

## 2012-02-10 ENCOUNTER — Other Ambulatory Visit (HOSPITAL_COMMUNITY): Payer: Self-pay | Admitting: Orthopedic Surgery

## 2012-02-10 DIAGNOSIS — M25569 Pain in unspecified knee: Secondary | ICD-10-CM

## 2012-02-10 DIAGNOSIS — M25561 Pain in right knee: Secondary | ICD-10-CM

## 2012-02-10 DIAGNOSIS — M25469 Effusion, unspecified knee: Secondary | ICD-10-CM

## 2012-02-10 DIAGNOSIS — M2390 Unspecified internal derangement of unspecified knee: Secondary | ICD-10-CM

## 2012-02-12 ENCOUNTER — Ambulatory Visit (HOSPITAL_COMMUNITY)
Admission: RE | Admit: 2012-02-12 | Discharge: 2012-02-12 | Disposition: A | Discharge status: Home / Self Care | Payer: 59 | Source: Ambulatory Visit | Attending: Orthopedic Surgery | Admitting: Orthopedic Surgery

## 2012-02-12 DIAGNOSIS — M2390 Unspecified internal derangement of unspecified knee: Secondary | ICD-10-CM

## 2012-02-12 DIAGNOSIS — M25569 Pain in unspecified knee: Secondary | ICD-10-CM | POA: Insufficient documentation

## 2012-02-12 DIAGNOSIS — M25561 Pain in right knee: Secondary | ICD-10-CM

## 2012-02-12 DIAGNOSIS — M25469 Effusion, unspecified knee: Secondary | ICD-10-CM

## 2012-02-12 DIAGNOSIS — M6740 Ganglion, unspecified site: Secondary | ICD-10-CM | POA: Insufficient documentation

## 2012-02-12 DIAGNOSIS — M2240 Chondromalacia patellae, unspecified knee: Secondary | ICD-10-CM | POA: Insufficient documentation

## 2012-02-12 DIAGNOSIS — M23302 Other meniscus derangements, unspecified lateral meniscus, unspecified knee: Secondary | ICD-10-CM | POA: Insufficient documentation

## 2012-02-13 ENCOUNTER — Other Ambulatory Visit (HOSPITAL_COMMUNITY): Payer: 59

## 2012-03-09 ENCOUNTER — Emergency Department (HOSPITAL_COMMUNITY): Payer: 59

## 2012-03-09 ENCOUNTER — Emergency Department (HOSPITAL_COMMUNITY)
Admission: EM | Admit: 2012-03-09 | Discharge: 2012-03-09 | Disposition: A | Discharge status: Home / Self Care | Payer: 59 | Attending: Emergency Medicine | Admitting: Emergency Medicine

## 2012-03-09 ENCOUNTER — Encounter (HOSPITAL_COMMUNITY): Payer: Self-pay | Admitting: *Deleted

## 2012-03-09 DIAGNOSIS — H539 Unspecified visual disturbance: Secondary | ICD-10-CM | POA: Insufficient documentation

## 2012-03-09 DIAGNOSIS — G43909 Migraine, unspecified, not intractable, without status migrainosus: Secondary | ICD-10-CM | POA: Insufficient documentation

## 2012-03-09 DIAGNOSIS — Z8719 Personal history of other diseases of the digestive system: Secondary | ICD-10-CM | POA: Insufficient documentation

## 2012-03-09 DIAGNOSIS — Z79899 Other long term (current) drug therapy: Secondary | ICD-10-CM | POA: Insufficient documentation

## 2012-03-09 DIAGNOSIS — Z8739 Personal history of other diseases of the musculoskeletal system and connective tissue: Secondary | ICD-10-CM | POA: Insufficient documentation

## 2012-03-09 LAB — BASIC METABOLIC PANEL
BUN: 20 mg/dL (ref 6–23)
Chloride: 102 mEq/L (ref 96–112)
Creatinine, Ser: 0.67 mg/dL (ref 0.50–1.10)
GFR calc Af Amer: >90 mL/min (ref >90)

## 2012-03-09 LAB — CBC WITH DIFFERENTIAL/PLATELET
Basophils Relative: 0 % (ref 0–1)
HCT: 39.7 % (ref 36.0–46.0)
Hemoglobin: 13.5 g/dL (ref 12.0–15.0)
MCH: 31.3 pg (ref 26.0–34.0)
MCHC: 34 g/dL (ref 30.0–36.0)
MCV: 91.9 fL (ref 78.0–100.0)
Monocytes Absolute: 0.6 10*3/uL (ref 0.1–1.0)
Monocytes Relative: 5 % (ref 3–12)
Neutro Abs: 8.6 10*3/uL — ABNORMAL HIGH (ref 1.7–7.7)

## 2012-03-09 MED ORDER — DIPHENHYDRAMINE HCL 50 MG/ML IJ SOLN
25.0000 mg | Freq: Once | INTRAMUSCULAR | Status: AC
  Administered 2012-03-09: 25 mg via INTRAVENOUS
  Filled 2012-03-09: qty 1

## 2012-03-09 MED ORDER — METOCLOPRAMIDE HCL 5 MG/ML IJ SOLN
10.0000 mg | Freq: Once | INTRAMUSCULAR | Status: AC
  Administered 2012-03-09: 10 mg via INTRAVENOUS
  Filled 2012-03-09: qty 2

## 2012-03-09 MED ORDER — KETOROLAC TROMETHAMINE 30 MG/ML IJ SOLN
30.0000 mg | Freq: Once | INTRAMUSCULAR | Status: AC
  Administered 2012-03-09: 30 mg via INTRAVENOUS
  Filled 2012-03-09: qty 1

## 2012-03-09 MED ORDER — SODIUM CHLORIDE 0.9 % IV SOLN
INTRAVENOUS | Status: DC
  Administered 2012-03-09: 19:00:00 via INTRAVENOUS

## 2012-03-09 NOTE — ED Notes (Signed)
Pt states about 3:45 pm today got a sudden onset of a headache, states R side of headache is worse than L side, when asked about blurred vision states "I don't have my contacts in so I don't know". Pt states she does have sensitivity to light and sound, nausea, denies vomiting, denies hx of cardiac problems, denies hx of htn. Pt states she does feel weak.

## 2012-03-09 NOTE — ED Provider Notes (Signed)
History     CSN: [id number]  Arrival date & time 03/09/12  1708   First MD Initiated Contact with Patient 03/09/12 1846      No chief complaint on file.   (Consider location/radiation/quality/duration/timing/severity/associated sxs/prior treatment) HPI Comments: Patient comes to the ER for evaluation of headache. Patient reports that she had onset of right sided headache at around 3:45 PM today. Pain is across the frontal part of her head, but is much worse around the right eye. She reports a sharp, stabbing and throbbing pain. Pain is worsened by exposure to bright lights. She has nausea but no vomiting. She says she had migraines years ago, but has not had a headache in quite some time. She has not had any fever, neck pain or stiffness.   Past Medical History  Diagnosis Date  . Acute meniscal tear of knee LEFT  . Seasonal allergies   . Environmental allergies   . History of gastric ulcer AS TEEN  . 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 Vaginal hysterectomy 03-18-1999  . Cholecystectomy 1992  . Appendectomy 1998  . Bilateral carpal tunnel release 1980'S  . Transthoracic echocardiogram 04-26-2009    NORMAL LVSF/ EF 60-65% / LVDF NORMAL / NO PERICARDIAL EFFUSION  . Knee arthroscopy 06/20/2011    Procedure: ARTHROSCOPY KNEE;  Surgeon: Shelda Pal, MD;  Location: [facility];  Service: Orthopedics;  Laterality: Left;  left knee scope   latex allergy patient blisters and swells    History reviewed. No pertinent family history.  History  Substance Use Topics  . Smoking status: Never Smoker   . Smokeless tobacco: Never Used  . Alcohol Use: No    OB History    Grav Para Term Preterm Abortions TAB SAB Ect Mult Living                  Review of Systems  Constitutional: Negative for fever.  HENT: Negative for neck pain and neck stiffness.   Eyes:  Positive for photophobia.  Neurological: Positive for headaches. Negative for dizziness, seizures, syncope and numbness.  All other systems reviewed and are negative.    Allergies  Latex and Sulfa antibiotics  Home Medications   Current Outpatient Rx  Name  Route  Sig  Dispense  Refill  . ALBUTEROL SULFATE HFA 108 (90 BASE) MCG/ACT IN AERS   Inhalation   Inhale 1-2 puffs into the lungs every 6 (six) hours as needed for wheezing.   1 Inhaler   2   . DIPHENHYDRAMINE HCL 25 MG PO TABS   Oral   Take 25 mg by mouth every 6 (six) hours as needed. For headache.         . IPRATROPIUM-ALBUTEROL 20-100 MCG/ACT IN AERS   Inhalation   Inhale 1 puff into the lungs every 6 (six) hours as needed. For shortness of breath.           BP 142/88  Pulse 70  Temp 98.4 F (36.9 C) (Oral)  Resp 18  SpO2 100%  Physical Exam  Constitutional: She is oriented to person, place, and time. She appears well-developed and well-nourished. No distress.  HENT:  Head: Normocephalic and atraumatic.  Right Ear: Hearing normal.  Nose: Nose normal.  Mouth/Throat: Oropharynx is clear and moist and mucous membranes are normal.  Eyes: Conjunctivae normal and EOM are normal. Pupils are  equal, round, and reactive to light.  Neck: Normal range of motion. Neck supple.  Cardiovascular: Normal rate, regular rhythm, S1 normal and S2 normal.  Exam reveals no gallop and no friction rub.   No murmur heard. Pulmonary/Chest: Effort normal and breath sounds normal. No respiratory distress. She exhibits no tenderness.  Abdominal: Soft. Normal appearance and bowel sounds are normal. There is no hepatosplenomegaly. There is no tenderness. There is no rebound, no guarding, no tenderness at McBurney's point and negative Murphy's sign. No hernia.  Musculoskeletal: Normal range of motion.  Neurological: She is alert and oriented to person, place, and time. She has normal strength. No cranial nerve deficit or sensory deficit.  Coordination normal. GCS eye subscore is 4. GCS verbal subscore is 5. GCS motor subscore is 6.  Skin: Skin is warm, dry and intact. No rash noted. No cyanosis.  Psychiatric: She has a normal mood and affect. Her speech is normal and behavior is normal. Thought content normal.    ED Course  Procedures (including critical care time)  Labs Reviewed - No data to display Ct Head Wo Contrast  03/09/2012  *RADIOLOGY REPORT*  Clinical Data: Headache  CT HEAD WITHOUT CONTRAST  Technique:  Contiguous axial images were obtained from the base of the skull through the vertex without contrast.  Comparison: None.  Findings: 3 mm   focal parenchymal calcification in the posterior right frontal lobe. There is no evidence of acute intracranial hemorrhage, brain edema, mass lesion, acute infarction,   mass effect, or midline shift. Acute infarct may be inapparent on noncontrast CT.  No other intra- axial abnormalities are seen, and the ventricles and sulci are within normal limits in size and symmetry.   No abnormal extra- axial fluid collections or masses are identified.  No significant calvarial abnormality.  IMPRESSION: 1. Negative for bleed or other acute intracranial process.   Original Report Authenticated By: D. Andria Rhein, MD      Diagnosis: Migraine headache    MDM  Presents with headache which was acute in onset today. Pain did seem consistent with a migraine headache as she had a throbbing pain behind and in her right eye. Symptoms worsened by bright light. She does have a history of migraines but not for many years. A CAT scan was performed and was normal. Because this is an unusual headache for the patient, did a conversation with her and her family about possible lumbar puncture to rule out subarachnoid hemorrhage. She understands the concern of necessity for this, but as she improved Reglan and Benadryl and was almost pain-free now, she is not anxious to proceed with this procedure. I do agree that  clinically the most likely diagnosis is migraine headache, but it did reiterate to them that I could not completely rule out subarachnoid hemorrhage. She'll return to the ER she has worsening symptoms.        Gilda Crease, MD 03/09/12 2206

## 2012-03-11 ENCOUNTER — Ambulatory Visit (INDEPENDENT_AMBULATORY_CARE_PROVIDER_SITE_OTHER): Payer: 59 | Admitting: Family Medicine

## 2012-03-11 VITALS — BP 115/78 | HR 78 | Temp 98.6°F | Resp 18 | Wt 173.0 lb

## 2012-03-11 DIAGNOSIS — G43909 Migraine, unspecified, not intractable, without status migrainosus: Secondary | ICD-10-CM

## 2012-03-11 DIAGNOSIS — R51 Headache: Secondary | ICD-10-CM

## 2012-03-11 DIAGNOSIS — R110 Nausea: Secondary | ICD-10-CM

## 2012-03-11 MED ORDER — KETOROLAC TROMETHAMINE 60 MG/2ML IM SOLN
60.0000 mg | Freq: Once | INTRAMUSCULAR | Status: AC
  Administered 2012-03-11: 60 mg via INTRAMUSCULAR

## 2012-03-11 MED ORDER — TRAMADOL HCL 50 MG PO TABS: 50.0000 mg | ORAL_TABLET | Freq: Four times a day (QID) | ORAL | Status: DC | PRN

## 2012-03-11 MED ORDER — TIZANIDINE HCL 2 MG PO TABS: 2.0000 mg | ORAL_TABLET | Freq: Four times a day (QID) | ORAL | Status: DC | PRN

## 2012-03-11 NOTE — Progress Notes (Signed)
Subjective: To see the patient developed a severe headache, frontal and centered on the right temple area. It was quite severe. A coworker saw her getting in her car at work a lot and wouldn't let her drive. She went to [facility] long emergency room and was evaluated. CT scan of the head was normal. They thought she was having a migraine he gave her a shot of Toradol and let her go home. She did better at night and yesterday morning. This evening she started getting a headache again and is pretty severe again this morning. She's had nausea but no vomiting. She does not feel he needs any antiemetic at this time. She has not really taken anything much for the headaches.  She does have a remote history of migraines, last one about 10 years ago.  Objective: Has significant photophobia. Her TMs are normal. Eyes PERRLA. Pupils little bit constricted. Fundi were little difficult to see, but I got a good view of the left fundus which was normal. Her throat was clear. Neck supple without nodes. Chest clear. Heart regular without murmurs. Cranial nerves 2-12 grossly intact. Motor function normal. Tandem walk was a little bit clumsy. Finger to nose was adequate a little slow. Romberg was probably negative, though DOS there is not quite as good his some. Patient's husband brought her to the office today.  Assessment: Migraine headache Headache pain Nausea  Plan: Give an injection of pain medication, as well as tramadol and been at home. Return if in all worse. Stay off work today and probably tomorrow. Get plenty of rest.

## 2012-03-11 NOTE — Addendum Note (Signed)
Addended by: Johnnette Litter on: 03/11/2012 01:04 PM   Modules accepted: Orders

## 2012-03-11 NOTE — Patient Instructions (Addendum)
If headaches gets abruptly worse go to emergency room, or return here if it continues to persist.  Take the tramadol and tizanidine today, then starting tomorrow just use it on an as needed basis.

## 2012-03-16 ENCOUNTER — Ambulatory Visit (INDEPENDENT_AMBULATORY_CARE_PROVIDER_SITE_OTHER): Payer: 59 | Admitting: Family Medicine

## 2012-03-16 VITALS — BP 133/79 | HR 86 | Temp 98.0°F | Resp 18 | Ht 64.0 in | Wt 178.0 lb

## 2012-03-16 DIAGNOSIS — G43909 Migraine, unspecified, not intractable, without status migrainosus: Secondary | ICD-10-CM

## 2012-03-16 MED ORDER — HYDROCODONE-ACETAMINOPHEN 5-500 MG PO TABS: 1.0000 | ORAL_TABLET | ORAL | Status: DC | PRN

## 2012-03-16 MED ORDER — KETOROLAC TROMETHAMINE 60 MG/2ML IM SOLN
60.0000 mg | Freq: Once | INTRAMUSCULAR | Status: AC
  Administered 2012-03-16: 60 mg via INTRAMUSCULAR

## 2012-03-16 MED ORDER — RIZATRIPTAN BENZOATE 5 MG PO TABS: ORAL_TABLET | ORAL | Status: DC

## 2012-03-16 NOTE — Patient Instructions (Addendum)
Take Maxalt one if needed for migraine. May repeat in 2 hours if needed. Do not exceed 3 in 24 hours.  If pain continues severely, try one of the hydrocodone pills  If headaches continue to persist over the next couple of day we will consider ordering further workup and referral.  If you keep on having headaches, we will try the Topamax for long-term prevention. Typically it takes building up the dose of a little bit before it helps the migraines, and in my experience is not very helpful for the acute phase of migraines.

## 2012-03-16 NOTE — Progress Notes (Signed)
Subjective: Patient was seen 5 days ago with a migraine. She was better Sunday morning and was able to go to church, but then went home and lay down the rest of the day. She felt much better yesterday and got up and went to work. However last night the headache came back with a vengeance. The pain is in the right side of her hand in the temple area. The tramadol makes her nauseated.  Objective: Pleasant lady in obvious distress. She is very photophobic. TMs normal. Eyes PERRLA. Fundi benign. Throat clear. Neck supple without nodes thyromegaly. No carotid bruits. Chest clear. Heart regular without murmurs.  Her brother who is a physician suggested we consider trying some Topamax on her.  Assessment: Migraine  Plan: Hydrocodone 5 mg/APAP #20 1Q4H when necessary headache. Meds ordered this encounter  Medications  . rizatriptan (MAXALT) 5 MG tablet    Sig: Take one for migraine. May repeat in 2 hours if necessary. Do not exceed 3 in 24 hours    Dispense:  10 tablet    Refill:  0  . HYDROcodone-acetaminophen (VICODIN) 5-500 MG per tablet    Sig: Take 1 tablet by mouth every 4 (four) hours as needed for pain.    Dispense:  20 tablet    Refill:  0  . ketorolac (TORADOL) injection 60 mg    Sig:    See discharge instructions

## 2012-03-18 ENCOUNTER — Telehealth: Payer: Self-pay

## 2012-03-18 DIAGNOSIS — G43909 Migraine, unspecified, not intractable, without status migrainosus: Secondary | ICD-10-CM

## 2012-03-18 NOTE — Telephone Encounter (Signed)
PT WOULD LIKE REFERRAL TO NEUROLOGIST FOR MIGRAINES, STATES DR HOPPER PUT IN NOTES IF MIGRAINES GOT WORSE WE COULD REFER HER OUT     PLEASE ADVISE  PT PH [phone]

## 2012-03-18 NOTE — Telephone Encounter (Signed)
Can we refer, in your notes it says its ok but do you want pt to wait a couple more days? Its only been two days or proceed with referral.

## 2012-03-23 ENCOUNTER — Ambulatory Visit: Payer: 59 | Admitting: Family Medicine

## 2012-03-23 NOTE — Telephone Encounter (Signed)
Procedure making the referral if she still wishes for it.

## 2012-03-24 NOTE — Telephone Encounter (Signed)
Notified pt that referral has been ordered and she will be contacted when appt is set up. Pt agreed.

## 2012-03-26 ENCOUNTER — Other Ambulatory Visit (HOSPITAL_COMMUNITY): Payer: Self-pay | Admitting: Diagnostic Neuroimaging

## 2012-03-26 DIAGNOSIS — R51 Headache: Secondary | ICD-10-CM

## 2012-03-29 ENCOUNTER — Ambulatory Visit (HOSPITAL_COMMUNITY)
Admission: RE | Admit: 2012-03-29 | Discharge: 2012-03-29 | Disposition: A | Discharge status: Home / Self Care | Payer: 59 | Source: Ambulatory Visit | Attending: Diagnostic Neuroimaging | Admitting: Diagnostic Neuroimaging

## 2012-03-29 ENCOUNTER — Ambulatory Visit (HOSPITAL_COMMUNITY): Admission: RE | Admit: 2012-03-29 | Payer: 59 | Source: Ambulatory Visit

## 2012-03-29 DIAGNOSIS — R110 Nausea: Secondary | ICD-10-CM | POA: Insufficient documentation

## 2012-03-29 DIAGNOSIS — R51 Headache: Secondary | ICD-10-CM

## 2012-03-29 DIAGNOSIS — G43909 Migraine, unspecified, not intractable, without status migrainosus: Secondary | ICD-10-CM | POA: Insufficient documentation

## 2012-03-30 ENCOUNTER — Ambulatory Visit (HOSPITAL_COMMUNITY): Payer: 59

## 2012-03-30 ENCOUNTER — Ambulatory Visit (HOSPITAL_COMMUNITY)
Admission: RE | Admit: 2012-03-30 | Discharge: 2012-03-30 | Disposition: A | Discharge status: Home / Self Care | Payer: 59 | Source: Ambulatory Visit | Attending: Diagnostic Neuroimaging | Admitting: Diagnostic Neuroimaging

## 2012-04-10 ENCOUNTER — Other Ambulatory Visit: Payer: Self-pay

## 2012-04-19 ENCOUNTER — Other Ambulatory Visit: Payer: Self-pay | Admitting: Family Medicine

## 2012-07-30 ENCOUNTER — Telehealth: Payer: Self-pay

## 2012-07-30 NOTE — Telephone Encounter (Signed)
Ok per [employer] to change directions on Combivent. Spoke with pharmacist at [facility] and will be changed.

## 2012-07-30 NOTE — Telephone Encounter (Signed)
[name] is calling to get patient's inhaler prescription changed. The original prescription has been discontinued. Instead of 2 puffs every 6 hours the new requires 1 puff every 6 hours. Please call pharmacy [phone]

## 2012-08-24 ENCOUNTER — Ambulatory Visit (INDEPENDENT_AMBULATORY_CARE_PROVIDER_SITE_OTHER): Payer: 59 | Admitting: Family Medicine

## 2012-08-24 VITALS — BP 119/73 | HR 83 | Temp 98.2°F | Resp 16 | Ht 64.0 in | Wt 186.6 lb

## 2012-08-24 DIAGNOSIS — R059 Cough, unspecified: Secondary | ICD-10-CM

## 2012-08-24 DIAGNOSIS — J309 Allergic rhinitis, unspecified: Secondary | ICD-10-CM

## 2012-08-24 DIAGNOSIS — J302 Other seasonal allergic rhinitis: Secondary | ICD-10-CM

## 2012-08-24 DIAGNOSIS — J019 Acute sinusitis, unspecified: Secondary | ICD-10-CM

## 2012-08-24 DIAGNOSIS — R05 Cough: Secondary | ICD-10-CM

## 2012-08-24 MED ORDER — AZITHROMYCIN 250 MG PO TABS: ORAL_TABLET | ORAL | Status: DC

## 2012-08-24 MED ORDER — METHYLPREDNISOLONE 4 MG PO KIT: PACK | ORAL | Status: DC

## 2012-08-24 MED ORDER — FLUTICASONE PROPIONATE 50 MCG/ACT NA SUSP: 2.0000 | Freq: Every day | NASAL | Status: DC

## 2012-08-24 MED ORDER — FLUCONAZOLE 150 MG PO TABS: 150.0000 mg | ORAL_TABLET | Freq: Once | ORAL | Status: DC

## 2012-08-24 NOTE — Progress Notes (Signed)
[facility]:  Office Visit  Chief Complaint:  Chief Complaint  Patient presents with  . Allergic Rhinitis     x 2 weeks  . Otalgia    cough, congestion  . Headache    HPI: [name] is a 47 y.o. female who complains of  2 week history of allergies and feels like she has fluid inher ear and now she has stabbing pain and ringing in her left ear. Coughing up yellow mucus. Worse in the AM and midmorning. She has temporal HA and ear pain and goes down her neck. No wheezing, has had to use inhaler. She has used it 1x day every couple of days. Coughing make sher SOB worse. No fevers, + chills. Has allegra D and also mucinex D and simply saline with minimal relief. 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 Acute meniscal tear of knee LEFT  . Seasonal allergies   . Environmental allergies   . History of gastric ulcer AS TEEN  . 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 Arthritis   . Asthma    Past Surgical History  Procedure Laterality Date  . Vaginal hysterectomy  03-18-1999  . Cholecystectomy  1992  . Appendectomy  1998  . Bilateral carpal tunnel release  1980'S  . Transthoracic echocardiogram  04-26-2009    NORMAL LVSF/ EF 60-65% / LVDF NORMAL / NO PERICARDIAL EFFUSION  . Knee arthroscopy  06/20/2011    Procedure: ARTHROSCOPY KNEE;  Surgeon: Shelda Pal, MD;  Location: [facility];  Service: Orthopedics;  Laterality: Left;  left knee scope   latex allergy patient blisters and swells   History   Social History  . Marital Status: Married    Spouse Name: N/A    Number of Children: N/A  . Years of Education: N/A   Social History Main Topics  . Smoking status: Never Smoker   . Smokeless tobacco: Never Used  . Alcohol Use: No  . Drug Use: No  . Sexually Active: Yes    Birth  Control/ Protection: Surgical   Other Topics Concern  . None   Social History Narrative  . None   Family History  Problem Relation Age of Onset  . Cancer Brother   . Asthma Son    Allergies  Allergen Reactions  . Latex Swelling    AND BLISTERS  . Sulfa Antibiotics Anaphylaxis   Prior to Admission medications   Medication Sig Start Date End Date Taking? Authorizing Provider  albuterol-ipratropium (COMBIVENT) 18-103 MCG/ACT inhaler Inhale 2 puffs into the lungs every 6 (six) hours as needed for wheezing.   Yes Historical Provider, MD  dextromethorphan-guaiFENesin (MUCINEX DM) 30-600 MG per 12 hr tablet Take 1 tablet by mouth every 12 (twelve) hours.   Yes Historical Provider, MD  diphenhydrAMINE (BENADRYL) 50 MG tablet Take 50 mg by mouth at bedtime as needed.   Yes Historical Provider, MD  fexofenadine-pseudoephedrine (ALLEGRA-D 24) 180-240 MG per 24 hr tablet Take 1 tablet by mouth daily.   Yes Historical Provider, MD  HYDROcodone-acetaminophen (VICODIN) 5-500 MG per tablet Take 1 tablet by mouth every 4 (four) hours as needed for pain. 03/16/12   Peyton Najjar, MD  rizatriptan (MAXALT) 5 MG tablet TAKE 1 TABLET BY MOUTH FOR MIGRAINE. MAY REPEAT IN 2 HOURS IF NECESSARY. DO  NOT EXCEED 3 IN 24 HOURS. 04/19/12   Nelva Nay, PA-C  tiZANidine (ZANAFLEX) 2 MG tablet Take 1 tablet (2 mg total) by mouth every 6 (six) hours as needed. 03/11/12   Peyton Najjar, MD  traMADol (ULTRAM) 50 MG tablet Take 1 tablet (50 mg total) by mouth every 6 (six) hours as needed for pain. 03/11/12   Peyton Najjar, MD     ROS: The patient denies fevers, chills, night sweats, unintentional weight loss, chest pain, palpitations, wheezing, dyspnea on exertion, nausea, vomiting, abdominal pain, dysuria, hematuria, melena, numbness, weakness, or tingling.   All other systems have been reviewed and were otherwise negative with the exception of those mentioned in the HPI and as above.    PHYSICAL EXAM: Filed  Vitals:   08/24/12 1532  BP: 119/73  Pulse: 83  Temp: 98.2 F (36.8 C)  Resp: 16  Spo2  100% Filed Vitals:   08/24/12 1532  Height: 5\' 4"  (1.626 m)  Weight: 186 lb 9.6 oz (84.641 kg)   Body mass index is 32.01 kg/(m^2).  General: Alert, no acute distress HEENT:  Normocephalic, atraumatic, oropharynx patent. + boggy nares, + right sided sinus tenderness, no exudates. EOMI Cardiovascular:  Regular rate and rhythm, no rubs murmurs or gallops.  No Carotid bruits, radial pulse intact. No pedal edema.  Respiratory: Clear to auscultation bilaterally.  No wheezes, rales, or rhonchi.  No cyanosis, no use of accessory musculature GI: No organomegaly, abdomen is soft and non-tender, positive bowel sounds.  No masses. Skin: No rashes. Neurologic: Facial musculature symmetric. Psychiatric: Patient is appropriate throughout our interaction. Lymphatic: No cervical lymphadenopathy Musculoskeletal: Gait intact.   LABS: Results for orders placed during the hospital encounter of 03/09/12  CBC WITH DIFFERENTIAL      Result Value Range   WBC 11.5 (*) 4.0 - 10.5 K/uL   RBC 4.32  3.87 - 5.11 MIL/uL   Hemoglobin 13.5  12.0 - 15.0 g/dL   HCT 16.1  09.6 - 04.5 %   MCV 91.9  78.0 - 100.0 fL   MCH 31.3  26.0 - 34.0 pg   MCHC 34.0  30.0 - 36.0 g/dL   RDW 40.9  81.1 - 91.4 %   Platelets 259  150 - 400 K/uL   Neutrophils Relative % 75  43 - 77 %   Neutro Abs 8.6 (*) 1.7 - 7.7 K/uL   Lymphocytes Relative 19  12 - 46 %   Lymphs Abs 2.2  0.7 - 4.0 K/uL   Monocytes Relative 5  3 - 12 %   Monocytes Absolute 0.6  0.1 - 1.0 K/uL   Eosinophils Relative 1  0 - 5 %   Eosinophils Absolute 0.1  0.0 - 0.7 K/uL   Basophils Relative 0  0 - 1 %   Basophils Absolute 0.0  0.0 - 0.1 K/uL  BASIC METABOLIC PANEL      Result Value Range   Sodium 139  135 - 145 mEq/L   Potassium 3.7  3.5 - 5.1 mEq/L   Chloride 102  96 - 112 mEq/L   CO2 28  19 - 32 mEq/L   Glucose, Bld 89  70 - 99 mg/dL   BUN 20  6 - 23 mg/dL    Creatinine, Ser 7.82  0.50 - 1.10 mg/dL   Calcium 9.8  8.4 - 95.6 mg/dL   GFR calc non Af Amer >90  >90 mL/min   GFR calc Af Amer >90  >90 mL/min  EKG/XRAY:   Primary read interpreted by Dr. Conley Rolls at [facility].   ASSESSMENT/PLAN: Encounter Diagnoses  Name Primary?  . Seasonal allergies Yes  . Acute sinusitis   . Allergic rhinitis   . Cough    Rx Flonase Rx Azithromycin Rx Medrol Dose Pak C/w otc antihistamine, mucinex d, albuterol prn F/u prn    Remi Lopata PHUONG, DO 08/24/2012 4:17 PM

## 2012-08-24 NOTE — Patient Instructions (Signed)

## 2012-10-14 ENCOUNTER — Other Ambulatory Visit (HOSPITAL_COMMUNITY): Payer: Self-pay | Admitting: Sports Medicine

## 2012-10-14 ENCOUNTER — Ambulatory Visit (HOSPITAL_COMMUNITY)
Admission: RE | Admit: 2012-10-14 | Discharge: 2012-10-14 | Disposition: A | Discharge status: Home / Self Care | Payer: 59 | Source: Ambulatory Visit | Attending: Sports Medicine | Admitting: Sports Medicine

## 2012-10-14 DIAGNOSIS — M898X9 Other specified disorders of bone, unspecified site: Secondary | ICD-10-CM | POA: Insufficient documentation

## 2012-10-14 DIAGNOSIS — IMO0002 Reserved for concepts with insufficient information to code with codable children: Secondary | ICD-10-CM | POA: Insufficient documentation

## 2012-10-14 DIAGNOSIS — M75100 Unspecified rotator cuff tear or rupture of unspecified shoulder, not specified as traumatic: Secondary | ICD-10-CM | POA: Insufficient documentation

## 2012-10-14 DIAGNOSIS — M25511 Pain in right shoulder: Secondary | ICD-10-CM

## 2012-10-20 ENCOUNTER — Other Ambulatory Visit (HOSPITAL_COMMUNITY): Payer: 59

## 2012-10-27 ENCOUNTER — Ambulatory Visit: Payer: 59 | Attending: Sports Medicine | Admitting: Physical Therapy

## 2012-10-27 ENCOUNTER — Ambulatory Visit: Payer: 59 | Admitting: Physical Therapy

## 2012-10-27 DIAGNOSIS — M25619 Stiffness of unspecified shoulder, not elsewhere classified: Secondary | ICD-10-CM | POA: Insufficient documentation

## 2012-10-27 DIAGNOSIS — M25519 Pain in unspecified shoulder: Secondary | ICD-10-CM | POA: Insufficient documentation

## 2012-10-27 DIAGNOSIS — IMO0001 Reserved for inherently not codable concepts without codable children: Secondary | ICD-10-CM | POA: Insufficient documentation

## 2012-11-01 ENCOUNTER — Ambulatory Visit: Payer: 59 | Admitting: Rehabilitation

## 2012-11-03 ENCOUNTER — Ambulatory Visit: Payer: 59 | Admitting: Physical Therapy

## 2012-11-10 ENCOUNTER — Ambulatory Visit: Payer: 59 | Admitting: Rehabilitation

## 2012-11-11 ENCOUNTER — Ambulatory Visit: Payer: 59 | Admitting: Rehabilitation

## 2012-11-17 ENCOUNTER — Ambulatory Visit: Payer: 59 | Admitting: Rehabilitation

## 2012-11-25 ENCOUNTER — Ambulatory Visit: Payer: 59 | Attending: Sports Medicine | Admitting: Rehabilitation

## 2012-11-25 DIAGNOSIS — M25619 Stiffness of unspecified shoulder, not elsewhere classified: Secondary | ICD-10-CM | POA: Insufficient documentation

## 2012-11-25 DIAGNOSIS — M25519 Pain in unspecified shoulder: Secondary | ICD-10-CM | POA: Insufficient documentation

## 2012-11-25 DIAGNOSIS — IMO0001 Reserved for inherently not codable concepts without codable children: Secondary | ICD-10-CM | POA: Insufficient documentation

## 2012-11-26 ENCOUNTER — Ambulatory Visit: Payer: 59 | Admitting: Physical Therapy

## 2012-11-29 ENCOUNTER — Other Ambulatory Visit: Payer: Self-pay | Admitting: Plastic Surgery

## 2012-12-10 ENCOUNTER — Encounter (HOSPITAL_BASED_OUTPATIENT_CLINIC_OR_DEPARTMENT_OTHER): Payer: Self-pay | Admitting: *Deleted

## 2012-12-10 NOTE — Progress Notes (Signed)
No labs needed-bring all meds and inhalers-overnight bag

## 2012-12-14 ENCOUNTER — Ambulatory Visit (HOSPITAL_BASED_OUTPATIENT_CLINIC_OR_DEPARTMENT_OTHER)
Admission: RE | Admit: 2012-12-14 | Payer: PRIVATE HEALTH INSURANCE | Source: Ambulatory Visit | Admitting: Plastic Surgery

## 2012-12-14 ENCOUNTER — Encounter (HOSPITAL_BASED_OUTPATIENT_CLINIC_OR_DEPARTMENT_OTHER): Payer: Self-pay | Admitting: Anesthesiology

## 2012-12-14 HISTORY — DX: Encounter for fitting and adjustment of spectacles and contact lenses: Z46.0

## 2012-12-14 SURGERY — MAMMOPLASTY, REDUCTION
Anesthesia: General | Site: Breast | Laterality: Bilateral

## 2012-12-14 MED ORDER — MORPHINE SULFATE 10 MG/ML IJ SOLN
INTRAMUSCULAR | Status: AC
  Filled 2012-12-14: qty 1

## 2012-12-14 MED ORDER — FENTANYL CITRATE 0.05 MG/ML IJ SOLN
INTRAMUSCULAR | Status: AC
  Filled 2012-12-14: qty 2

## 2012-12-14 MED ORDER — PROPOFOL 10 MG/ML IV BOLUS
INTRAVENOUS | Status: AC
  Filled 2012-12-14: qty 20

## 2012-12-14 MED ORDER — PROPOFOL 10 MG/ML IV EMUL
INTRAVENOUS | Status: AC
  Filled 2012-12-14: qty 50

## 2012-12-14 MED ORDER — BACITRACIN ZINC 500 UNIT/GM EX OINT
TOPICAL_OINTMENT | CUTANEOUS | Status: AC
  Filled 2012-12-14: qty 28.35

## 2012-12-14 MED ORDER — MIDAZOLAM HCL 2 MG/2ML IJ SOLN
INTRAMUSCULAR | Status: AC
  Filled 2012-12-14: qty 2

## 2012-12-14 MED ORDER — SUCCINYLCHOLINE CHLORIDE 20 MG/ML IJ SOLN
INTRAMUSCULAR | Status: AC
  Filled 2012-12-14: qty 1

## 2012-12-14 MED ORDER — FENTANYL CITRATE 0.05 MG/ML IJ SOLN
INTRAMUSCULAR | Status: AC
  Filled 2012-12-14: qty 6

## 2012-12-15 ENCOUNTER — Encounter (HOSPITAL_BASED_OUTPATIENT_CLINIC_OR_DEPARTMENT_OTHER): Payer: Self-pay | Admitting: *Deleted

## 2012-12-21 ENCOUNTER — Ambulatory Visit (HOSPITAL_BASED_OUTPATIENT_CLINIC_OR_DEPARTMENT_OTHER)
Admission: RE | Admit: 2012-12-21 | Discharge: 2012-12-22 | Disposition: A | Discharge status: Home / Self Care | Payer: 59 | Source: Ambulatory Visit | Attending: Plastic Surgery | Admitting: Plastic Surgery

## 2012-12-21 ENCOUNTER — Encounter (HOSPITAL_BASED_OUTPATIENT_CLINIC_OR_DEPARTMENT_OTHER): Payer: 59 | Admitting: Anesthesiology

## 2012-12-21 ENCOUNTER — Encounter (HOSPITAL_BASED_OUTPATIENT_CLINIC_OR_DEPARTMENT_OTHER)
Admission: RE | Disposition: A | Discharge status: Home / Self Care | Payer: Self-pay | Source: Ambulatory Visit | Attending: Plastic Surgery

## 2012-12-21 ENCOUNTER — Encounter (HOSPITAL_BASED_OUTPATIENT_CLINIC_OR_DEPARTMENT_OTHER): Payer: Self-pay | Admitting: Anesthesiology

## 2012-12-21 ENCOUNTER — Ambulatory Visit (HOSPITAL_BASED_OUTPATIENT_CLINIC_OR_DEPARTMENT_OTHER): Payer: 59 | Admitting: Anesthesiology

## 2012-12-21 DIAGNOSIS — M549 Dorsalgia, unspecified: Secondary | ICD-10-CM | POA: Insufficient documentation

## 2012-12-21 DIAGNOSIS — N62 Hypertrophy of breast: Secondary | ICD-10-CM | POA: Insufficient documentation

## 2012-12-21 DIAGNOSIS — Z91040 Latex allergy status: Secondary | ICD-10-CM | POA: Insufficient documentation

## 2012-12-21 DIAGNOSIS — Z79899 Other long term (current) drug therapy: Secondary | ICD-10-CM | POA: Insufficient documentation

## 2012-12-21 DIAGNOSIS — M542 Cervicalgia: Secondary | ICD-10-CM | POA: Insufficient documentation

## 2012-12-21 DIAGNOSIS — N6481 Ptosis of breast: Secondary | ICD-10-CM | POA: Insufficient documentation

## 2012-12-21 DIAGNOSIS — M25519 Pain in unspecified shoulder: Secondary | ICD-10-CM | POA: Insufficient documentation

## 2012-12-21 DIAGNOSIS — J45909 Unspecified asthma, uncomplicated: Secondary | ICD-10-CM | POA: Insufficient documentation

## 2012-12-21 HISTORY — PX: BREAST REDUCTION SURGERY: SHX8

## 2012-12-21 LAB — POCT HEMOGLOBIN-HEMACUE: Hemoglobin: 12.8 g/dL (ref 12.0–15.0)

## 2012-12-21 SURGERY — MAMMOPLASTY, REDUCTION
Anesthesia: General | Site: Breast | Laterality: Bilateral | Wound class: Clean

## 2012-12-21 MED ORDER — HYDROMORPHONE HCL 2 MG PO TABS
ORAL_TABLET | ORAL | Status: AC
  Filled 2012-12-21: qty 2

## 2012-12-21 MED ORDER — DEXAMETHASONE SODIUM PHOSPHATE 4 MG/ML IJ SOLN
INTRAMUSCULAR | Status: DC | PRN
  Administered 2012-12-21: 10 mg via INTRAVENOUS

## 2012-12-21 MED ORDER — ONDANSETRON HCL 4 MG/2ML IJ SOLN: 4.0000 mg | Freq: Once | INTRAMUSCULAR | Status: AC | PRN

## 2012-12-21 MED ORDER — NEOSTIGMINE METHYLSULFATE 1 MG/ML IJ SOLN
INTRAMUSCULAR | Status: DC | PRN
  Administered 2012-12-21: 5 mg via INTRAVENOUS

## 2012-12-21 MED ORDER — LIDOCAINE HCL (CARDIAC) 20 MG/ML IV SOLN
INTRAVENOUS | Status: DC | PRN
  Administered 2012-12-21: 100 mg via INTRAVENOUS

## 2012-12-21 MED ORDER — PROMETHAZINE HCL 25 MG/ML IJ SOLN
INTRAMUSCULAR | Status: AC
  Filled 2012-12-21: qty 1

## 2012-12-21 MED ORDER — FENTANYL CITRATE 0.05 MG/ML IJ SOLN: 50.0000 ug | INTRAMUSCULAR | Status: DC | PRN

## 2012-12-21 MED ORDER — DOCUSATE SODIUM 100 MG PO CAPS
100.0000 mg | ORAL_CAPSULE | Freq: Every day | ORAL | Status: DC
  Administered 2012-12-21: 100 mg via ORAL

## 2012-12-21 MED ORDER — BACITRACIN ZINC 500 UNIT/GM EX OINT
TOPICAL_OINTMENT | CUTANEOUS | Status: AC
  Filled 2012-12-21: qty 28.35

## 2012-12-21 MED ORDER — HYDROMORPHONE HCL PF 1 MG/ML IJ SOLN
INTRAMUSCULAR | Status: AC
  Filled 2012-12-21: qty 1

## 2012-12-21 MED ORDER — HEPARIN SODIUM (PORCINE) 5000 UNIT/ML IJ SOLN
5000.0000 [IU] | Freq: Once | INTRAMUSCULAR | Status: AC
  Administered 2012-12-21: 5000 [IU] via SUBCUTANEOUS

## 2012-12-21 MED ORDER — OXYCODONE HCL 5 MG/5ML PO SOLN: 5.0000 mg | Freq: Once | ORAL | Status: AC | PRN

## 2012-12-21 MED ORDER — MIDAZOLAM HCL 5 MG/5ML IJ SOLN
INTRAMUSCULAR | Status: DC | PRN
  Administered 2012-12-21: 2 mg via INTRAVENOUS

## 2012-12-21 MED ORDER — MIDAZOLAM HCL 2 MG/2ML IJ SOLN: 1.0000 mg | INTRAMUSCULAR | Status: DC | PRN

## 2012-12-21 MED ORDER — DOCUSATE SODIUM 100 MG PO CAPS
ORAL_CAPSULE | ORAL | Status: AC
  Filled 2012-12-21: qty 1

## 2012-12-21 MED ORDER — FENTANYL CITRATE 0.05 MG/ML IJ SOLN
INTRAMUSCULAR | Status: DC | PRN
  Administered 2012-12-21 (×2): 100 ug via INTRAVENOUS

## 2012-12-21 MED ORDER — LACTATED RINGERS IV SOLN
INTRAVENOUS | Status: DC
  Administered 2012-12-21 (×3): via INTRAVENOUS

## 2012-12-21 MED ORDER — CEFAZOLIN SODIUM-DEXTROSE 2-3 GM-% IV SOLR
2.0000 g | INTRAVENOUS | Status: AC
  Administered 2012-12-21: 2 g via INTRAVENOUS

## 2012-12-21 MED ORDER — PROPOFOL 10 MG/ML IV BOLUS
INTRAVENOUS | Status: AC
  Filled 2012-12-21: qty 40

## 2012-12-21 MED ORDER — MORPHINE SULFATE 10 MG/ML IJ SOLN
INTRAMUSCULAR | Status: DC | PRN
  Administered 2012-12-21 (×2): 5 mg via INTRAVENOUS

## 2012-12-21 MED ORDER — ROCURONIUM BROMIDE 100 MG/10ML IV SOLN
INTRAVENOUS | Status: DC | PRN
  Administered 2012-12-21: 50 mg via INTRAVENOUS

## 2012-12-21 MED ORDER — METHOCARBAMOL 500 MG PO TABS
ORAL_TABLET | ORAL | Status: AC
  Filled 2012-12-21: qty 1

## 2012-12-21 MED ORDER — HEPARIN SODIUM (PORCINE) 5000 UNIT/ML IJ SOLN
INTRAMUSCULAR | Status: AC
  Filled 2012-12-21: qty 1

## 2012-12-21 MED ORDER — METHOCARBAMOL 500 MG PO TABS
500.0000 mg | ORAL_TABLET | Freq: Four times a day (QID) | ORAL | Status: DC
  Administered 2012-12-21 – 2012-12-22 (×3): 500 mg via ORAL

## 2012-12-21 MED ORDER — 0.9 % SODIUM CHLORIDE (POUR BTL) OPTIME
TOPICAL | Status: DC | PRN
  Administered 2012-12-21: 16000 mL

## 2012-12-21 MED ORDER — PROPOFOL 10 MG/ML IV BOLUS
INTRAVENOUS | Status: DC | PRN
  Administered 2012-12-21: 200 mg via INTRAVENOUS

## 2012-12-21 MED ORDER — MIDAZOLAM HCL 2 MG/2ML IJ SOLN
INTRAMUSCULAR | Status: AC
  Filled 2012-12-21: qty 2

## 2012-12-21 MED ORDER — OXYCODONE HCL 5 MG PO TABS
5.0000 mg | ORAL_TABLET | Freq: Once | ORAL | Status: AC | PRN
Start: 2012-12-21 — End: 2012-12-21

## 2012-12-21 MED ORDER — ACETAMINOPHEN 325 MG PO TABS: 650.0000 mg | ORAL_TABLET | Freq: Four times a day (QID) | ORAL | Status: DC | PRN

## 2012-12-21 MED ORDER — FLUCONAZOLE 150 MG PO TABS
150.0000 mg | ORAL_TABLET | Freq: Once | ORAL | Status: AC
  Administered 2012-12-21: 150 mg via ORAL
  Filled 2012-12-21: qty 1

## 2012-12-21 MED ORDER — ALBUTEROL SULFATE HFA 108 (90 BASE) MCG/ACT IN AERS: 2.0000 | INHALATION_SPRAY | Freq: Four times a day (QID) | RESPIRATORY_TRACT | Status: DC | PRN

## 2012-12-21 MED ORDER — IPRATROPIUM-ALBUTEROL 18-103 MCG/ACT IN AERO: 2.0000 | INHALATION_SPRAY | Freq: Four times a day (QID) | RESPIRATORY_TRACT | Status: DC | PRN

## 2012-12-21 MED ORDER — DEXTROSE-NACL 5-0.45 % IV SOLN
INTRAVENOUS | Status: DC
  Administered 2012-12-21: 16:00:00 via INTRAVENOUS

## 2012-12-21 MED ORDER — MORPHINE SULFATE 10 MG/ML IJ SOLN
INTRAMUSCULAR | Status: AC
  Filled 2012-12-21: qty 1

## 2012-12-21 MED ORDER — HYDROMORPHONE HCL 2 MG PO TABS
2.0000 mg | ORAL_TABLET | ORAL | Status: DC | PRN
  Administered 2012-12-21 (×2): 4 mg via ORAL
  Administered 2012-12-22: 2 mg via ORAL

## 2012-12-21 MED ORDER — GLYCOPYRROLATE 0.2 MG/ML IJ SOLN
INTRAMUSCULAR | Status: DC | PRN
  Administered 2012-12-21: 0.4 mg via INTRAVENOUS

## 2012-12-21 MED ORDER — FLUTICASONE PROPIONATE 50 MCG/ACT NA SUSP
2.0000 | Freq: Every day | NASAL | Status: DC
  Administered 2012-12-21: 2 via NASAL
  Filled 2012-12-21: qty 16

## 2012-12-21 MED ORDER — FENTANYL CITRATE 0.05 MG/ML IJ SOLN
INTRAMUSCULAR | Status: AC
  Filled 2012-12-21: qty 8

## 2012-12-21 MED ORDER — HYDROMORPHONE HCL PF 1 MG/ML IJ SOLN
0.2500 mg | INTRAMUSCULAR | Status: DC | PRN
  Administered 2012-12-21 (×4): 0.5 mg via INTRAVENOUS

## 2012-12-21 MED ORDER — PROMETHAZINE HCL 25 MG/ML IJ SOLN
6.2500 mg | INTRAMUSCULAR | Status: DC | PRN
  Administered 2012-12-21: 6.25 mg via INTRAVENOUS

## 2012-12-21 SURGICAL SUPPLY — 63 items
ADH SKN CLS APL DERMABOND .7 (GAUZE/BANDAGES/DRESSINGS) ×2
BAG URINE DRAINAGE (UROLOGICAL SUPPLIES) ×1 IMPLANT
BALL CTTN LRG ABS STRL LF (GAUZE/BANDAGES/DRESSINGS)
BANDAGE ELASTIC 6 VELCRO ST LF (GAUZE/BANDAGES/DRESSINGS) ×4 IMPLANT
BLADE HEX COATED 2.75 (ELECTRODE) ×2 IMPLANT
BLADE SURG 10 STRL SS (BLADE) ×4 IMPLANT
BLADE SURG 15 STRL LF DISP TIS (BLADE) ×1 IMPLANT
BLADE SURG 15 STRL SS (BLADE) ×2
CANISTER SUCT 1200ML W/VALVE (MISCELLANEOUS) ×2 IMPLANT
CATH FOLEY LATEX FREE 16FR (CATHETERS) ×2
CATH FOLEY LF 16FR (CATHETERS) IMPLANT
CHLORAPREP W/TINT 26ML (MISCELLANEOUS) ×3 IMPLANT
COTTONBALL LRG STERILE PKG (GAUZE/BANDAGES/DRESSINGS) IMPLANT
COVER MAYO STAND STRL (DRAPES) ×2 IMPLANT
COVER TABLE BACK 60X90 (DRAPES) ×2 IMPLANT
DERMABOND ADVANCED (GAUZE/BANDAGES/DRESSINGS) ×2
DERMABOND ADVANCED .7 DNX12 (GAUZE/BANDAGES/DRESSINGS) ×2 IMPLANT
DRAIN CHANNEL 19F RND (DRAIN) IMPLANT
DRAPE LAPAROSCOPIC ABDOMINAL (DRAPES) ×2 IMPLANT
DRAPE UTILITY XL STRL (DRAPES) ×2 IMPLANT
DRSG PAD ABDOMINAL 8X10 ST (GAUZE/BANDAGES/DRESSINGS) ×8 IMPLANT
ELECT REM PT RETURN 9FT ADLT (ELECTROSURGICAL) ×2
ELECTRODE REM PT RTRN 9FT ADLT (ELECTROSURGICAL) ×1 IMPLANT
EVACUATOR SILICONE 100CC (DRAIN) IMPLANT
FILTER 7/8 IN (FILTER) ×2 IMPLANT
GAUZE SPONGE 4X4 12PLY STRL LF (GAUZE/BANDAGES/DRESSINGS) IMPLANT
GLOVE BIO SURGEON STRL SZ7.5 (GLOVE) ×2 IMPLANT
GLOVE BIO SURGEON STRL SZ8 (GLOVE) IMPLANT
GLOVE BIOGEL PI IND STRL 7.0 (GLOVE) IMPLANT
GLOVE BIOGEL PI IND STRL 8 (GLOVE) ×1 IMPLANT
GLOVE BIOGEL PI INDICATOR 7.0 (GLOVE) ×1
GLOVE BIOGEL PI INDICATOR 8 (GLOVE) ×1
GLOVE EXAM NITRILE MD LF STRL (GLOVE) ×1 IMPLANT
GLOVE SURG SS PI 6.5 STRL IVOR (GLOVE) ×2 IMPLANT
GLOVE SURG SS PI 7.0 STRL IVOR (GLOVE) ×2 IMPLANT
GLOVE SURG SS PI 7.5 STRL IVOR (GLOVE) ×1 IMPLANT
GOWN PREVENTION PLUS XLARGE (GOWN DISPOSABLE) ×4 IMPLANT
GOWN PREVENTION PLUS XXLARGE (GOWN DISPOSABLE) ×2 IMPLANT
NS IRRIG 1000ML POUR BTL (IV SOLUTION) ×3 IMPLANT
PACK BASIN DAY SURGERY FS (CUSTOM PROCEDURE TRAY) ×2 IMPLANT
PAD EYE OVAL STERILE LF (GAUZE/BANDAGES/DRESSINGS) IMPLANT
PIN SAFETY STERILE (MISCELLANEOUS) IMPLANT
SLEEVE SCD COMPRESS KNEE MED (MISCELLANEOUS) ×1 IMPLANT
SPECIMEN JAR X LARGE (MISCELLANEOUS) ×4 IMPLANT
SPONGE GAUZE 4X4 12PLY (GAUZE/BANDAGES/DRESSINGS) ×4 IMPLANT
SPONGE LAP 18X18 X RAY DECT (DISPOSABLE) ×7 IMPLANT
SUT MNCRL AB 3-0 PS2 18 (SUTURE) ×17 IMPLANT
SUT MNCRL AB 4-0 PS2 18 (SUTURE) ×8 IMPLANT
SUT MON AB 2-0 CT1 36 (SUTURE) ×1 IMPLANT
SUT PROLENE 4 0 PS 2 18 (SUTURE) IMPLANT
SUT PROLENE 5 0 PS 2 (SUTURE) IMPLANT
SUT SILK 4 0 PS 2 (SUTURE) IMPLANT
SUT VIC AB 2-0 PS2 27 (SUTURE) ×1 IMPLANT
SUT VICRYL 3-0 CR8 SH (SUTURE) IMPLANT
SYR 5ML LL (SYRINGE) ×1 IMPLANT
SYR BULB IRRIGATION 50ML (SYRINGE) ×4 IMPLANT
TOWEL OR 17X24 6PK STRL BLUE (TOWEL DISPOSABLE) ×5 IMPLANT
TOWEL OR NON WOVEN STRL DISP B (DISPOSABLE) ×1 IMPLANT
TRAY FOLEY CATH 14FR (SET/KITS/TRAYS/PACK) IMPLANT
TUBE CONNECTING 20X1/4 (TUBING) ×2 IMPLANT
UNDERPAD 30X30 INCONTINENT (UNDERPADS AND DIAPERS) ×4 IMPLANT
VAC PENCILS W/TUBING CLEAR (MISCELLANEOUS) ×2 IMPLANT
YANKAUER SUCT BULB TIP NO VENT (SUCTIONS) ×2 IMPLANT

## 2012-12-21 NOTE — Transfer of Care (Signed)
Immediate Anesthesia Transfer of Care Note  Patient: [name]  Procedure(s) Performed: Procedure(s): BILATERAL MAMMARY REDUCTION  (BREAST) (Bilateral)  Patient Location: PACU  Anesthesia Type:General  Level of Consciousness: awake and alert   Airway & Oxygen Therapy: Patient Spontanous Breathing and Patient connected to face mask oxygen  Post-op Assessment: Report given to PACU RN and Post -op Vital signs reviewed and stable  Post vital signs: Reviewed and stable  Complications: No apparent anesthesia complications

## 2012-12-21 NOTE — Anesthesia Postprocedure Evaluation (Signed)
  Anesthesia Post-op Note  Patient: [name]  Procedure(s) Performed: Procedure(s): BILATERAL MAMMARY REDUCTION  (BREAST) (Bilateral)  Patient Location: PACU  Anesthesia Type:General  Level of Consciousness: awake, alert  and oriented  Airway and Oxygen Therapy: Patient Spontanous Breathing and Patient connected to face mask oxygen  Post-op Pain: moderate  Post-op Assessment: Post-op Vital signs reviewed  Post-op Vital Signs: Reviewed  Complications: No apparent anesthesia complications

## 2012-12-21 NOTE — H&P (Signed)
PE: Heart RRR without murmur. S1S2 WNL        Lungs clear         Breasts hypertrophic and ptotic without mass        No axillary adenopathy  I have re-examined and re-evaluated the patient and there are no changes. Please see office notes in paper chart.  Planned procedure: Breast reduction bilateral

## 2012-12-21 NOTE — Anesthesia Procedure Notes (Signed)
Procedure Name: Intubation Date/Time: 12/21/2012 10:50 AM Performed by: Caren Macadam Pre-anesthesia Checklist: Patient identified, Emergency Drugs available, Suction available and Patient being monitored Patient Re-evaluated:Patient Re-evaluated prior to inductionOxygen Delivery Method: Circle System Utilized Preoxygenation: Pre-oxygenation with 100% oxygen Intubation Type: IV induction Ventilation: Mask ventilation without difficulty Laryngoscope Size: 2 and Miller Grade View: Grade I Tube type: Oral Number of attempts: 1 Airway Equipment and Method: stylet and oral airway Placement Confirmation: ETT inserted through vocal cords under direct vision,  positive ETCO2 and breath sounds checked- equal and bilateral Secured at: 22 cm Tube secured with: Tape Dental Injury: Teeth and Oropharynx as per pre-operative assessment

## 2012-12-21 NOTE — Brief Op Note (Signed)
12/21/2012  2:21 PM  PATIENT:  [name]  47 y.o. female  PRE-OPERATIVE DIAGNOSIS:  BREAST HYPERTROPHY  POST-OPERATIVE DIAGNOSIS:  BREAST HYPERTROPHY  PROCEDURE:  Procedure(s): BILATERAL MAMMARY REDUCTION  (BREAST) (Bilateral)  SURGEON:  Surgeon(s) and Role:    * Etter Sjogren, MD - Primary  PHYSICIAN ASSISTANT:   ASSISTANTS: none   ANESTHESIA:   general  EBL:  Total I/O In: 2300 [I.V.:2300] Out: 500 [Urine:300; Blood:200]  BLOOD ADMINISTERED:none  DRAINS: none   LOCAL MEDICATIONS USED:  NONE  SPECIMEN:  Source of Specimen:  Bilateral breast  DISPOSITION OF SPECIMEN:  PATHOLOGY  COUNTS:  YES  TOURNIQUET:  * No tourniquets in log *  DICTATION: .Other Dictation: Dictation Number [id number]  PLAN OF CARE: Admit for overnight observation  PATIENT DISPOSITION:  PACU - hemodynamically stable.   Delay start of Pharmacological VTE agent (>24hrs) due to surgical blood loss or risk of bleeding: no (she received subcu heparin pre-op)

## 2012-12-21 NOTE — Anesthesia Preprocedure Evaluation (Signed)
Anesthesia Evaluation  Patient identified by MRN, date of birth, ID band Patient awake    Reviewed: Allergy & Precautions, H&P , NPO status , Patient's Chart, lab work & pertinent test results  Airway Mallampati: I TM Distance: >3 FB Neck ROM: Full    Dental  (+) Teeth Intact and Dental Advisory Given   Pulmonary  breath sounds clear to auscultation        Cardiovascular Rhythm:Regular Rate:Normal     Neuro/Psych    GI/Hepatic   Endo/Other    Renal/GU      Musculoskeletal   Abdominal   Peds  Hematology   Anesthesia Other Findings   Reproductive/Obstetrics                           Anesthesia Physical Anesthesia Plan  ASA: I  Anesthesia Plan: General   Post-op Pain Management:    Induction: Intravenous  Airway Management Planned: Oral ETT  Additional Equipment:   Intra-op Plan:   Post-operative Plan: Extubation in OR  Informed Consent: I have reviewed the patients History and Physical, chart, labs and discussed the procedure including the risks, benefits and alternatives for the proposed anesthesia with the patient or authorized representative who has indicated his/her understanding and acceptance.   Dental advisory given  Plan Discussed with: CRNA, Anesthesiologist and Surgeon  Anesthesia Plan Comments:         Anesthesia Quick Evaluation  

## 2012-12-22 MED ORDER — HYDROMORPHONE HCL 2 MG PO TABS
ORAL_TABLET | ORAL | Status: AC
  Filled 2012-12-22: qty 1

## 2012-12-22 MED ORDER — METHOCARBAMOL 500 MG PO TABS
ORAL_TABLET | ORAL | Status: AC
  Filled 2012-12-22: qty 1

## 2012-12-22 MED ORDER — DSS 100 MG PO CAPS: 100.0000 mg | ORAL_CAPSULE | Freq: Every day | ORAL | Status: DC

## 2012-12-22 MED ORDER — FLUCONAZOLE 150 MG PO TABS: 150.0000 mg | ORAL_TABLET | Freq: Once | ORAL | Status: DC

## 2012-12-22 NOTE — Op Note (Signed)
Dawn, [name] NO.:  [id number]  MEDICAL RECORD NO.:  [id number]  LOCATION:                               FACILITY:  [facility]  PHYSICIAN:  Etter Sjogren, M.D.     DATE OF BIRTH:  [birth year]  DATE OF PROCEDURE:  12/21/2012 DATE OF DISCHARGE:  12/22/2012                              OPERATIVE REPORT   PREOPERATIVE DIAGNOSIS:  Bilateral macromastia.  POSTOPERATIVE DIAGNOSIS:  Bilateral macromastia.  PROCEDURE PERFORMED:  Bilateral breast reduction.  SURGEON:  Etter Sjogren, M.D.  ANESTHESIA:  General.  ESTIMATED BLOOD LOSS:  250 mL.  CLINICAL NOTE:  A 47 year old woman complains of very large breasts with upper back pain, neck pain, shoulder pain, and bra strap shoulder grooving.  She desired breast reduction.  She desired a reduction approximately half of her breast tissue.  This was estimated to be somewhere between 600 and 750 g with a little bit more from the larger right side.  She did agree that the right side was worse than the left. The nature of procedure and risks plus complications were discussed with her in detail.  These risks include, but not limited to, bleeding, infection, healing problems, scarring, loss of sensation, fluid accumulations, anesthesia-related complications, loss of sensation, loss of sensation in the nipple, loss of tissue, loss of nipple, loss of fatty tissue, loss of skin asymmetry, chronic pain, failure to relieve symptoms, distorted body image, and disappointment.  She understood all this and wished to proceed.  DESCRIPTION OF PROCEDURE:  The patient was marked in the holding area in a full standing position, and taken to the operating room and placed supine.  After successful induction of general anesthesia, she was prepped with ChloraPrep.  After waiting a full 3 minutes for drying, she was draped with sterile drapes; 9 cm wide inferior nipple-areolar pedicles were designed, and the 45 mm marker marked in the  nipple- areolar complex.  Incisions were made, inferior nipple-areolar pedicle was de-epithelialized.  The inferior nipple-areolar pedicle was then isolated from the surrounding tissues using electrocautery, beveling in an outward direction, medial and lateral in order to ensure broader attachment at the level of the chest wall then was present at the level of the skin.  At the conclusion of this dissection, there was bright red bleeding around the nipple-areolar complexes consistent with viability. Resections were performed medial, central, and lateral.  A total of 622 g was resected from the smaller left side and 718 g from the larger right side.  This seemed to give her fairly good symmetry in terms of volume.  Thorough irrigation with saline and meticulous hemostasis was achieved using electrocautery.  Excellent hemostasis having been ensured that closure with a 2-0 Vicryl interrupted inverted deep dermal suture at the inverted T position followed by 2-0 Monocryl and 3-0 Monocryl interrupted inverted deep dermal sutures and running 3-0 Monocryl subcuticular suture.  A measurement was then taken 5 cm up the inframammary crease in the 45 mm marker marked the new site for the nipple-areolar complex.  This tissue was resected, and meticulous hemostasis again with electrocautery.  Nipple-areolar complex was brought through this opening and were again inspected and found to have good color and  bright red bleeding around the periphery of both complexes consistent with viability.  Nipple-areolar insetting 3-0 Monocryl inverted deep dermal sutures, running 4-0 Monocryl subcuticular suture.  Dermabond was then applied, dry sterile dressing, circumferential Ace wrap.  She was transferred to the recovery room stable having tolerated the procedure well.  DISPOSITION:  She will be observed in the RCC overnight.     Etter Sjogren, M.D.     DB/MEDQ  D:  12/21/2012  T:  12/22/2012  Job:   [id number]

## 2012-12-22 NOTE — Op Note (Deleted)
NAME:  Dawn [name], Dawn [name]           ACCOUNT NO.:  [id number]  MEDICAL RECORD NO.:  [id number]  LOCATION:                               FACILITY:  [facility]  PHYSICIAN:  Jessicaann Overbaugh, M.D.     DATE OF BIRTH:  03/05/1965  DATE OF PROCEDURE:  12/21/2012 DATE OF DISCHARGE:  12/22/2012                              OPERATIVE REPORT   PREOPERATIVE DIAGNOSIS:  Bilateral macromastia.  POSTOPERATIVE DIAGNOSIS:  Bilateral macromastia.  PROCEDURE PERFORMED:  Bilateral breast reduction.  SURGEON:  Niyam Bisping, M.D.  ANESTHESIA:  General.  ESTIMATED BLOOD LOSS:  250 mL.  CLINICAL NOTE:  A 47-year-old woman complains of very large breasts with upper back pain, neck pain, shoulder pain, and bra strap shoulder grooving.  She desired breast reduction.  She desired a reduction approximately half of her breast tissue.  This was estimated to be somewhere between 600 and 750 g with a little bit more from the larger right side.  She did agree that the right side was worse than the left. The nature of procedure and risks plus complications were discussed with her in detail.  These risks include, but not limited to, bleeding, infection, healing problems, scarring, loss of sensation, fluid accumulations, anesthesia-related complications, loss of sensation, loss of sensation in the nipple, loss of tissue, loss of nipple, loss of fatty tissue, loss of skin asymmetry, chronic pain, failure to relieve symptoms, distorted body image, and disappointment.  She understood all this and wished to proceed.  DESCRIPTION OF PROCEDURE:  The patient was marked in the holding area in a full standing position, and taken to the operating room and placed supine.  After successful induction of general anesthesia, she was prepped with ChloraPrep.  After waiting a full 3 minutes for drying, she was draped with sterile drapes; 9 cm wide inferior nipple-areolar pedicles were designed, and the 45 mm marker marked in the  nipple- areolar complex.  Incisions were made, inferior nipple-areolar pedicle was de-epithelialized.  The inferior nipple-areolar pedicle was then isolated from the surrounding tissues using electrocautery, beveling in an outward direction, medial and lateral in order to ensure broader attachment at the level of the chest wall then was present at the level of the skin.  At the conclusion of this dissection, there was bright red bleeding around the nipple-areolar complexes consistent with viability. Resections were performed medial, central, and lateral.  A total of 622 g was resected from the smaller left side and 718 g from the larger right side.  This seemed to give her fairly good symmetry in terms of volume.  Thorough irrigation with saline and meticulous hemostasis was achieved using electrocautery.  Excellent hemostasis having been ensured that closure with a 2-0 Vicryl interrupted inverted deep dermal suture at the inverted T position followed by 2-0 Monocryl and 3-0 Monocryl interrupted inverted deep dermal sutures and running 3-0 Monocryl subcuticular suture.  A measurement was then taken 5 cm up the inframammary crease in the 45 mm marker marked the new site for the nipple-areolar complex.  This tissue was resected, and meticulous hemostasis again with electrocautery.  Nipple-areolar complex was brought through this opening and were again inspected and found to have good color and   bright red bleeding around the periphery of both complexes consistent with viability.  Nipple-areolar insetting 3-0 Monocryl inverted deep dermal sutures, running 4-0 Monocryl subcuticular suture.  Dermabond was then applied, dry sterile dressing, circumferential Ace wrap.  She was transferred to the recovery room stable having tolerated the procedure well.  DISPOSITION:  She will be observed in the RCC overnight.     Divya Munshi, M.D.     DB/MEDQ  D:  12/21/2012  T:  12/22/2012  Job:   [id number] 

## 2012-12-22 NOTE — Discharge Summary (Signed)
Physician Discharge Summary  Patient ID: Dawn [name] MRN: [id number] DOB/AGE: 08-02-1965 47 y.o.  Admit date: 12/21/2012 Discharge date: 12/22/2012  Admission Diagnoses:Macromastia  Discharge Diagnoses: Same Active Problems:   * No active hospital problems. *   Discharged Condition: good  Hospital Course: On the day of admission the patient was taken to surgery and had bilateral breast reduction. The patient tolerated the procedures well. Postoperatively, the nipple complexes maintained excellent color and capillary refill. The patient was ambulatory and tolerating diet on the first postoperative day. She is ready for discharge.   Treatments: antibiotics: Ancef, anticoagulation: heparin (pre-op) and surgery: bilateral breast reduction  Discharge Exam: Blood pressure 127/70, pulse 66, temperature 97.9 F (36.6 C), temperature source Oral, resp. rate 18, height 5\' 4"  (1.626 m), weight 187 lb 6 oz (84.993 kg), SpO2 100.00%.  Operative sites: Both breasts are soft. Nipple complexes sensate, viable. No evidence of bleeding or infection.  Disposition: 01-Home or Self Care     Medication List    STOP taking these medications       diphenhydrAMINE 50 MG tablet  Commonly known as:  BENADRYL     fexofenadine-pseudoephedrine 180-240 MG per 24 hr tablet  Commonly known as:  ALLEGRA-D 24      TAKE these medications       albuterol 108 (90 BASE) MCG/ACT inhaler  Commonly known as:  PROVENTIL HFA;VENTOLIN HFA  Inhale 2 puffs into the lungs every 6 (six) hours as needed for wheezing.     albuterol-ipratropium 18-103 MCG/ACT inhaler  Commonly known as:  COMBIVENT  Inhale 2 puffs into the lungs every 6 (six) hours as needed for wheezing.     b complex vitamins tablet  Take 1 tablet by mouth daily.     cholecalciferol 1000 UNITS tablet  Commonly known as:  VITAMIN D  Take 1,000 Units by mouth daily.     dextromethorphan-guaiFENesin 30-600 MG per 12 hr tablet  Commonly  known as:  MUCINEX DM  Take 1 tablet by mouth every 12 (twelve) hours.     DSS 100 MG Caps  Take 100 mg by mouth daily.     fluconazole 150 MG tablet  Commonly known as:  DIFLUCAN  Take 1 tablet (150 mg total) by mouth once. May repeat x 1     fluconazole 150 MG tablet  Commonly known as:  DIFLUCAN  Take 1 tablet (150 mg total) by mouth once.     fluticasone 50 MCG/ACT nasal spray  Commonly known as:  FLONASE  Place 2 sprays into the nose daily.     multivitamin with minerals tablet  Take 1 tablet by mouth daily.     vitamin C 500 MG tablet  Commonly known as:  ASCORBIC ACID  Take 500 mg by mouth daily.         SignedOdis Luster, Kye Hedden M 12/22/2012, 7:36 AM

## 2012-12-23 ENCOUNTER — Encounter (HOSPITAL_BASED_OUTPATIENT_CLINIC_OR_DEPARTMENT_OTHER): Payer: Self-pay | Admitting: Plastic Surgery

## 2012-12-30 ENCOUNTER — Other Ambulatory Visit: Payer: Self-pay

## 2013-02-21 ENCOUNTER — Ambulatory Visit: Payer: 59 | Admitting: Family Medicine

## 2013-02-21 ENCOUNTER — Encounter: Payer: Self-pay | Admitting: Family Medicine

## 2013-02-21 NOTE — Patient Instructions (Signed)
-   Eat at least 3 meals and 1-2 snacks per day.  Aim for no more than 5 hours between eating.  - This will mean more advance planning, for lunch especially.  - Obtain twice as many veg's as protein or carbohydrate foods for both lunch and dinner.  - Think about the protein foods to have on hand:  Eggs, meats, fish, dairy, beans, poultry, nut butters, soy products.  - PLAN YOUR WEEK'S MEALS, AND WHAT YOU NEED TO BUY.  - Complete weekly goals sheet provided today.   - ALSO:  Continue your current exercise routine, logging your activity on [employer].

## 2013-02-21 NOTE — Progress Notes (Signed)
Medical Nutrition Therapy:  Appt start time: 1430 end time:  1530.  Assessment:  Primary concerns today: Weight management.   Dawn [name] recognizes her need to take better care of herself and to lose weight, and is interested in learning to eat better.  She also knows she is helped by accountability.  Plans to log her exercise in the [employer]   Usual eating pattern includes 2-3 meals and ~2 snacks per day. Usual physical activity includes 20 min 2 x wk stationary bike, ~10 min of walking and stairs 5 X wk.  Frequent foods include 8-oz smoothie (kale or other green leafy, apple or orange, pineapple, blueberries or strawberries, chia-based pro powder, psyllum husks, 1/2 tsp coconut oil, almond milk or coconut water), walnuts or almonds, 95 oz water, 6" subway sub, baked chips 1-2 X wk.  Avoided foods include dairy foods (lactose intol) and some vegs (beets).    24-hr recall: (Up at 9:30 AM) B (10 AM)-   2 scrambled eggs (coconut oil), 1 small Dawn [name] sausage link, 1 slc toast, 1/2 c grits, water Snk ( AM)-   water L (2 PM)-  3/4 c Dawn [name], 1/2 c dressing, 1 c collards Snk ( PM)-  water D ( PM)-  [employer] chsburger, small ff's, 2 c half-sweet tea Snk ( PM)-  1 bag Pop Secret popcorn Yesterday was atypical b/c she almost always has a home-made smoothie, and she rarely eats fast food.  She sometimes brings lunch, i.e., Dawn [name] sandw or another smoothie.   Progress Towards Goal(s):  In progress.   Nutritional Diagnosis:  [place]-1.5 Excessive energy intake As related to expenditure.  As evidenced by BMI >33.    Intervention:  Nutrition education.  Monitoring/Evaluation:  Dietary intake, exercise, and body weight in 6 week(s).

## 2013-03-28 ENCOUNTER — Ambulatory Visit: Payer: 59 | Admitting: Family Medicine

## 2013-03-31 ENCOUNTER — Encounter: Payer: 59 | Attending: Internal Medicine | Admitting: Dietician

## 2013-03-31 ENCOUNTER — Encounter: Payer: Self-pay | Admitting: Dietician

## 2013-03-31 VITALS — Ht 64.0 in | Wt 187.4 lb

## 2013-03-31 DIAGNOSIS — Z6830 Body mass index (BMI) 30.0-30.9, adult: Secondary | ICD-10-CM | POA: Insufficient documentation

## 2013-03-31 DIAGNOSIS — Z713 Dietary counseling and surveillance: Secondary | ICD-10-CM | POA: Insufficient documentation

## 2013-03-31 DIAGNOSIS — E669 Obesity, unspecified: Secondary | ICD-10-CM | POA: Insufficient documentation

## 2013-03-31 DIAGNOSIS — M1710 Unilateral primary osteoarthritis, unspecified knee: Secondary | ICD-10-CM | POA: Insufficient documentation

## 2013-03-31 NOTE — Progress Notes (Signed)
Medical Nutrition Therapy:  Appt start time: 1630 end time:  1730.  Assessment:  Primary concerns today: Weight loss concerns. Pt comes in today after having already made a number of positive changes to her diet and exercise habits for the past 3 weeks.   Preferred Learning Style:  No preference indicated   Learning Readiness:   Change in progress   MEDICATIONS:  See list.    DIETARY INTAKE:  Usual eating pattern includes 3 meals and 2 snacks per day. Everyday foods include smoothie.  Avoided foods include certain breads, most sweets.    24-hr recall:  B ( AM): blended smoothie of (kale, spinach, ginger, variety of fruits, protein powder, flax seed with water or almond milk). 24-32 oz water Snk ( AM): mixed nuts or fruit or string cheese L ( PM): salad with grilled chix or no meat (spinach, cucumber, onions, bell peppers, tomatoes, olive oil, pepper, oregano). water Snk ( PM): same as AM D ( PM): turnip and collard greens, fish (sometimes fried). Sometimes corn bread or sweet potato.  Snk ( PM): sometimes a dessert item. Mostly cut out that.  Beverages: water, smoothie, once per week half sweet tea, hot herbal or green tea  Usual physical activity: for past 3 weeks, at gym 3 days per week (1 personal water aerobics class, 1 group water aerobics class, 1 do own routine and swimming). Owns a stationary bike, but only once per week at most 30 minutes. Also owns resistance bands and will do small amounts of that. Has added more daily life activity like stairs, walking in the mall.   Progress Towards Goal(s):  In progress.   Nutritional Diagnosis:  [place]-3.3 Overweight/obesity As related to history of sedentary lifestyle and high kcal choices.  As evidenced by BMI>30.    Intervention:  Nutrition counseling provided. Primary focus was on consistency of diet. RD discussed a variety of lean protein options to continue to choose from, the Plate Method for controlling carbohydrate intake. Pt  had some concerns with meeting protein needs, so RD explained the minimum protein needs for a pt her age and how to achieve it (which she already is doing). Increase in exercise to daily or near daily was main point of emphasis for continued improvement. She has a degenerative knee issue, so swimming is preferred, but RD also discussed yoga, walking, and cycling as possibilities.   Teaching Method Utilized: Visual Auditory  Handouts given during visit include:  Best Pro, Fat, and CHO rich foods for general health  Barriers to learning/adherence to lifestyle change: none noted.  Demonstrated degree of understanding via:  Teach Back   Monitoring/Evaluation:  Dietary intake, exercise, portion control, and body weight prn.

## 2013-05-31 ENCOUNTER — Encounter: Payer: Self-pay | Admitting: Family Medicine

## 2013-05-31 ENCOUNTER — Ambulatory Visit (INDEPENDENT_AMBULATORY_CARE_PROVIDER_SITE_OTHER): Payer: 59 | Admitting: Family Medicine

## 2013-05-31 VITALS — BP 117/75 | HR 80 | Ht 64.0 in | Wt 177.0 lb

## 2013-05-31 DIAGNOSIS — M25569 Pain in unspecified knee: Secondary | ICD-10-CM

## 2013-05-31 DIAGNOSIS — M25562 Pain in left knee: Secondary | ICD-10-CM

## 2013-05-31 NOTE — Patient Instructions (Signed)
Your pain is consistent with arthritis flare, less likely a new meniscus tear. Both treated similarly initially. Take tylenol 500mg  1-2 tabs three times a day for pain. Aleve 1-2 tabs twice a day with food Glucosamine sulfate 750mg  twice a day is a supplement that may help. Capsaicin topically up to four times a day may also help with pain. Cortisone injections are an option - you were given one today. If cortisone injections do not help, there are different types of shots that may help but they take longer to take effect. It's important that you continue to stay active. Straight leg raises, knee extensions 3 sets of 10 once a day (add ankle weight if these become too easy). Consider physical therapy to strengthen muscles around the joint that hurts to take pressure off of the joint itself. Shoe inserts with good arch support may be helpful. Walker or cane if needed. Heat or ice 15 minutes at a time 3-4 times a day as needed to help with pain. Water aerobics and cycling with low resistance are the best two types of exercise for arthritis. Follow up with me in 1 month or as needed.

## 2013-06-02 ENCOUNTER — Encounter: Payer: Self-pay | Admitting: Family Medicine

## 2013-06-02 DIAGNOSIS — M25562 Pain in left knee: Secondary | ICD-10-CM | POA: Insufficient documentation

## 2013-06-02 NOTE — Assessment & Plan Note (Signed)
consistent with flare of DJD, less likely new meniscus tear given she had this knee scoped 2 years ago and has not had a new injury.  Start with tylenol, nsaids, glucosamine, capsaicin.  Injection given today.  Quad strengthening exercises.  Heat or ice.  Continue with water aerobics.  F/u in 1 month or prn.  After informed written consent, patient was lying supine on exam table. Left knee was prepped with alcohol swab and utilizing superolateral approach, patient's left knee was injected intraarticularly with 3:1 marcaine: depomedrol. Patient tolerated the procedure well without immediate complications.

## 2013-06-02 NOTE — Progress Notes (Signed)
Patient ID: Dawn [name], female   DOB: [birth year], 48 y.o.   MRN: [id number]  PCP: Katy Apo, MD  Subjective:   HPI: Patient is a 48 y.o. female here for left knee pain.  Patient denies known new injury. She had this knee scoped in 2013 - uncertain if repaired or only debrided. Past month has had medial pulling pain and swelling. Has tried tylenol arthritis, ibuprofen, icing, a topical ointment. Knee pops and feels locked at times. Does water aerobics and walks for exercise. Takes glucosamine also.  Past Medical History  Diagnosis Date  . Acute meniscal tear of knee LEFT  . Seasonal allergies   . Environmental allergies   . History of gastric ulcer AS TEEN  . 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 Arthritis   . Asthma   . Contact lens/glasses fitting     wears contacts or glasses    Current Outpatient Prescriptions on File Prior to Visit  Medication Sig Dispense Refill  . albuterol (PROVENTIL HFA;VENTOLIN HFA) 108 (90 BASE) MCG/ACT inhaler Inhale 2 puffs into the lungs every 6 (six) hours as needed for wheezing.      Marland Kitchen b complex vitamins tablet Take 1 tablet by mouth daily.      . cholecalciferol (VITAMIN D) 1000 UNITS tablet Take 1,000 Units by mouth daily.      . Multiple Vitamins-Minerals (MULTIVITAMIN WITH MINERALS) tablet Take 1 tablet by mouth daily.      . peppermint oil liquid 1 capsule by Does not apply route as needed.      . vitamin C (ASCORBIC ACID) 500 MG tablet Take 500 mg by mouth daily.       No current facility-administered medications on file prior to visit.    Past Surgical History  Procedure Laterality Date  . Vaginal hysterectomy  03-18-1999  . Cholecystectomy  1992  . Appendectomy  1998  . Bilateral carpal tunnel release  1980'S  . Transthoracic echocardiogram  04-26-2009    NORMAL LVSF/ EF 60-65% / LVDF NORMAL / NO PERICARDIAL EFFUSION  . Knee arthroscopy  06/20/2011    Procedure: ARTHROSCOPY KNEE;  Surgeon: Shelda Pal, MD;  Location: [facility];  Service: Orthopedics;  Laterality: Left;  left knee scope   latex allergy patient blisters and swells  . Breast reduction surgery Bilateral 12/21/2012    Procedure: BILATERAL MAMMARY REDUCTION  (BREAST);  Surgeon: Etter Sjogren, MD;  Location: [facility];  Service: Plastics;  Laterality: Bilateral;    Allergies  Allergen Reactions  . Latex Swelling    AND BLISTERS  . Sulfa Antibiotics Anaphylaxis    History   Social History  . Marital Status: Married    Spouse Name: N/A    Number of Children: N/A  . Years of Education: N/A   Occupational History  . Not on file.   Social History Main Topics  . Smoking status: Never Smoker   . Smokeless tobacco: Never Used  . Alcohol Use: No  . Drug Use: No  . Sexual Activity: Yes    Birth Control/ Protection: Surgical   Other Topics Concern  . Not on file   Social History Narrative  . No narrative on file    Family History  Problem Relation Age of Onset  . Cancer Brother   . Asthma Son     BP 117/75  Pulse 80  Ht 5\' 4"  (1.626 m)  Wt 177 lb (80.287 kg)  BMI 30.37 kg/m2  Review of Systems: See HPI above.    Objective:  Physical Exam:  Gen: NAD  Left knee: Mild effusion.  No bruising, other deformity. Mild medial joint line TTP.  No other TTP about knee. FROM. Negative ant/post drawers. Negative valgus/varus testing. Negative lachmanns. Negative mcmurrays, apleys, patellar apprehension. NV intact distally.    Assessment & Plan:  1. Left knee pain - consistent with flare of DJD, less likely new meniscus tear given she had this knee scoped 2 years ago and has not had a new injury.  Start with tylenol, nsaids, glucosamine, capsaicin.  Injection given today.  Quad strengthening exercises.  Heat or ice.  Continue with water aerobics.  F/u in 1 month or prn.  After informed written consent, patient was lying  supine on exam table. Left knee was prepped with alcohol swab and utilizing superolateral approach, patient's left knee was injected intraarticularly with 3:1 marcaine: depomedrol. Patient tolerated the procedure well without immediate complications.

## 2013-08-19 ENCOUNTER — Encounter: Payer: Self-pay | Admitting: Family Medicine

## 2013-08-19 ENCOUNTER — Ambulatory Visit (INDEPENDENT_AMBULATORY_CARE_PROVIDER_SITE_OTHER): Payer: 59 | Admitting: Family Medicine

## 2013-08-19 VITALS — BP 108/74 | HR 72 | Ht 64.0 in | Wt 182.0 lb

## 2013-08-19 DIAGNOSIS — M25569 Pain in unspecified knee: Secondary | ICD-10-CM

## 2013-08-19 DIAGNOSIS — M25561 Pain in right knee: Secondary | ICD-10-CM

## 2013-08-19 MED ORDER — METHYLPREDNISOLONE ACETATE 40 MG/ML IJ SUSP
40.0000 mg | Freq: Once | INTRAMUSCULAR | Status: AC
  Administered 2013-08-19: 40 mg via INTRA_ARTICULAR

## 2013-08-19 NOTE — Patient Instructions (Signed)
Your exam and history are concerning for a medial meniscus tear. Take tylenol 500mg  1-2 tabs three times a day for pain. Aleve 1-2 tabs twice a day with food Salonpas or capsaicin topically up to 4 times a day for pain. Cortisone injections are an option - you were given one today. It's important that you continue to stay active. Straight leg raises, knee extensions 3 sets of 10 once a day (add ankle weight if these become too easy). Avoid deep squats, lunges, leg press, twisting activities. Start at least 2-3 visits of physical therapy to strengthen muscles around the joint that hurts to take pressure off of the joint itself. Shoe inserts with good arch support may be helpful. Walker or cane if needed. Heat or ice 15 minutes at a time 3-4 times a day as needed to help with pain. Follow up with me in 1 month or as needed. Call if you're struggling after a week or two - would consider an MRI if that is the case.

## 2013-08-23 ENCOUNTER — Encounter: Payer: Self-pay | Admitting: Family Medicine

## 2013-08-23 DIAGNOSIS — M25561 Pain in right knee: Secondary | ICD-10-CM | POA: Insufficient documentation

## 2013-08-23 NOTE — Progress Notes (Signed)
Patient ID: Barron SchmidConstance M Goh, female   DOB: 05/13/1965, 48 y.o.   MRN: [id number]  PCP: Katy ApoPOLITE,RONALD D, MD  Subjective:   HPI: Patient is a 48 y.o. female here for right knee pain.  Patient [facility] she was stepping down on a stair when she felt a jolt medial right knee. Felt uncomfortable, associated with swelling. Had more trouble driving especially past 2 weeks. Pain is still medial. Has tried icing, salon pas. Radiates down her leg. Also tried aleve and ibuprofen. No prior issues with this knee.  Past Medical History  Diagnosis Date  . Acute meniscal tear of knee LEFT  . Seasonal allergies   . Environmental allergies   . History of gastric ulcer AS TEEN  . 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 Arthritis   . Asthma   . Contact lens/glasses fitting     wears contacts or glasses    Current Outpatient Prescriptions on File Prior to Visit  Medication Sig Dispense Refill  . albuterol (PROVENTIL HFA;VENTOLIN HFA) 108 (90 BASE) MCG/ACT inhaler Inhale 2 puffs into the lungs every 6 (six) hours as needed for wheezing.      Marland Kitchen. b complex vitamins tablet Take 1 tablet by mouth daily.      . cholecalciferol (VITAMIN D) 1000 UNITS tablet Take 1,000 Units by mouth daily.      . Multiple Vitamins-Minerals (MULTIVITAMIN WITH MINERALS) tablet Take 1 tablet by mouth daily.      . peppermint oil liquid 1 capsule by Does not apply route as needed.      . vitamin C (ASCORBIC ACID) 500 MG tablet Take 500 mg by mouth daily.       No current facility-administered medications on file prior to visit.    Past Surgical History  Procedure Laterality Date  . Vaginal hysterectomy  03-18-1999  . Cholecystectomy  1992  . Appendectomy  1998  . Bilateral carpal tunnel release  1980'S  . Transthoracic echocardiogram  04-26-2009    NORMAL LVSF/ EF 60-65% / LVDF NORMAL / NO  PERICARDIAL EFFUSION  . Knee arthroscopy  06/20/2011    Procedure: ARTHROSCOPY KNEE;  Surgeon: Shelda PalMatthew D Olin, MD;  [facility];  Service: Orthopedics;  Laterality: Left;  left knee scope   latex allergy patient blisters and swells  . Breast reduction surgery Bilateral 12/21/2012    Procedure: BILATERAL MAMMARY REDUCTION  (BREAST);  Surgeon: Etter Sjogrenavid Bowers, MD;  Location: [facility];  Service: Plastics;  Laterality: Bilateral;    Allergies  Allergen Reactions  . Latex Swelling    AND BLISTERS  . Sulfa Antibiotics Anaphylaxis    History   Social History  . Marital Status: Married    Spouse Name: N/A    Number of Children: N/A  . Years of Education: N/A   Occupational History  . Not on file.   Social History Main Topics  . Smoking status: Never Smoker   . Smokeless tobacco: Never Used  . Alcohol Use: No  . Drug Use: No  . Sexual Activity: Yes    Birth Control/ Protection: Surgical   Other Topics Concern  . Not on file   Social History Narrative  . No narrative on file    Family History  Problem Relation Age of Onset  . Cancer Brother   . Asthma Son  BP 108/74  Pulse 72  Ht 5\' 4"  (1.626 m)  Wt 182 lb (82.555 kg)  BMI 31.22 kg/m2  Review of Systems: See HPI above.    Objective:  Physical Exam:  Gen: NAD  Right knee: Mild effusion.  No bruising, other deformity. TTP medial joint line.  No other tenderness. FROM with pain on full flexion. Negative ant/post drawers. Negative valgus/varus testing. Negative lachmanns. Positive mcmurrays, apleys.  Negative patellar apprehension. NV intact distally.    Assessment & Plan:  1. Right knee pain - concerning for medial meniscal tear.  Will try to treat conservatively - tylenol, nsaids, cortisone injection (given today), quad strengthening, PT.  Heat or ice as needed.  F/u in 1 month - can call sooner if not improving and would consider MRI.  After informed written  consent, patient was lying supine on exam table. Right knee was prepped with alcohol swab and utilizing superolateral approach with ultrasound guidance, patient's right knee was injected intraarticularly with 3:1 marcaine: depomedrol. Patient tolerated the procedure well without immediate complications.

## 2013-08-23 NOTE — Assessment & Plan Note (Signed)
concerning for medial meniscal tear.  Will try to treat conservatively - tylenol, nsaids, cortisone injection (given today), quad strengthening, PT.  Heat or ice as needed.  F/u in 1 month - can call sooner if not improving and would consider MRI.  After informed written consent, patient was lying supine on exam table. Right knee was prepped with alcohol swab and utilizing superolateral approach with ultrasound guidance, patient's right knee was injected intraarticularly with 3:1 marcaine: depomedrol. Patient tolerated the procedure well without immediate complications.

## 2013-09-05 ENCOUNTER — Ambulatory Visit: Payer: PRIVATE HEALTH INSURANCE | Attending: Family Medicine

## 2013-09-05 DIAGNOSIS — IMO0001 Reserved for inherently not codable concepts without codable children: Secondary | ICD-10-CM | POA: Diagnosis present

## 2013-09-05 DIAGNOSIS — M25569 Pain in unspecified knee: Secondary | ICD-10-CM | POA: Diagnosis not present

## 2013-09-16 ENCOUNTER — Ambulatory Visit: Payer: PRIVATE HEALTH INSURANCE | Admitting: Physical Therapy

## 2013-09-16 DIAGNOSIS — IMO0001 Reserved for inherently not codable concepts without codable children: Secondary | ICD-10-CM | POA: Diagnosis not present

## 2013-09-27 ENCOUNTER — Ambulatory Visit: Payer: PRIVATE HEALTH INSURANCE | Attending: Family Medicine | Admitting: Physical Therapy

## 2013-09-27 DIAGNOSIS — M25569 Pain in unspecified knee: Secondary | ICD-10-CM | POA: Insufficient documentation

## 2013-09-27 DIAGNOSIS — IMO0001 Reserved for inherently not codable concepts without codable children: Secondary | ICD-10-CM | POA: Insufficient documentation

## 2013-09-29 ENCOUNTER — Ambulatory Visit: Payer: PRIVATE HEALTH INSURANCE

## 2013-09-29 DIAGNOSIS — IMO0001 Reserved for inherently not codable concepts without codable children: Secondary | ICD-10-CM | POA: Diagnosis not present

## 2013-10-04 ENCOUNTER — Ambulatory Visit: Payer: PRIVATE HEALTH INSURANCE | Admitting: Physical Therapy

## 2013-10-06 ENCOUNTER — Ambulatory Visit: Payer: PRIVATE HEALTH INSURANCE | Admitting: Physical Therapy

## 2013-10-06 DIAGNOSIS — IMO0001 Reserved for inherently not codable concepts without codable children: Secondary | ICD-10-CM | POA: Diagnosis not present

## 2013-10-12 ENCOUNTER — Ambulatory Visit: Payer: PRIVATE HEALTH INSURANCE | Admitting: Physical Therapy

## 2013-10-12 DIAGNOSIS — IMO0001 Reserved for inherently not codable concepts without codable children: Secondary | ICD-10-CM | POA: Diagnosis not present

## 2013-10-18 ENCOUNTER — Ambulatory Visit: Payer: 59 | Admitting: Family Medicine

## 2013-10-19 ENCOUNTER — Other Ambulatory Visit: Payer: Self-pay | Admitting: Occupational Medicine

## 2013-10-19 ENCOUNTER — Ambulatory Visit: Payer: Self-pay

## 2013-10-19 DIAGNOSIS — M25561 Pain in right knee: Secondary | ICD-10-CM

## 2013-10-20 ENCOUNTER — Ambulatory Visit: Payer: PRIVATE HEALTH INSURANCE | Admitting: Physical Therapy

## 2013-10-21 ENCOUNTER — Encounter: Payer: 59 | Admitting: Physical Therapy

## 2013-11-08 ENCOUNTER — Ambulatory Visit (INDEPENDENT_AMBULATORY_CARE_PROVIDER_SITE_OTHER): Payer: 59 | Admitting: Family Medicine

## 2013-11-08 ENCOUNTER — Encounter: Payer: Self-pay | Admitting: Family Medicine

## 2013-11-08 VITALS — BP 128/81 | HR 75 | Ht 64.0 in

## 2013-11-08 DIAGNOSIS — M25569 Pain in unspecified knee: Secondary | ICD-10-CM

## 2013-11-08 DIAGNOSIS — M25561 Pain in right knee: Secondary | ICD-10-CM

## 2013-11-08 MED ORDER — METHYLPREDNISOLONE ACETATE 40 MG/ML IJ SUSP
40.0000 mg | Freq: Once | INTRAMUSCULAR | Status: AC
  Administered 2013-11-08: 40 mg via INTRA_ARTICULAR

## 2013-11-08 NOTE — Patient Instructions (Addendum)
This is likely due to knee contusion, possible flare of arthritis or meniscus tear. All are treated conservatively at first. Your knee was aspirated and injected today. Icing 15 minutes at a time 3-4 times a day. Ibuprofen  three times a day with food for pain and inflammation. Elevate above the level of your heart as needed for swelling. Knee brace or sleeve for compression and support. Straight leg raises, knee extensions 3 sets of 10 once a day. Follow up with me in 4 weeks for reevaluation.

## 2013-11-09 ENCOUNTER — Encounter: Payer: Self-pay | Admitting: Family Medicine

## 2013-11-09 NOTE — Progress Notes (Signed)
Patient ID: Dawn [name], female   DOB: 02/09/1966, 49 y.o.   MRN: [id number]  PCP: Katy Apo, MD  Subjective:   HPI: Patient is a 48 y.o. female here for right knee pain.  6/26: Patient reports about a month ago at the [facility] event at the [facility] she was stepping down on a stair when she felt a jolt medial right knee. Felt uncomfortable, associated with swelling. Had more trouble driving especially past 2 weeks. Pain is still medial. Has tried icing, salon pas. Radiates down her leg. Also tried aleve and ibuprofen. No prior issues with this knee.  9/15: Patient reports she had been doing well following injection last visit. Then on 8/17 she accidentally hit medial right knee on edge of a desk. Worsened over a week. Associated with swelling. Tried nsaids, tramadol. Difficulty bending. Radiographs negative. No catching, locking, giving out.  Past Medical History  Diagnosis Date  . Acute meniscal tear of knee LEFT  . Seasonal allergies   . Environmental allergies   . History of gastric ulcer AS TEEN  . 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 Arthritis   . Asthma   . Contact lens/glasses fitting     wears contacts or glasses    Current Outpatient Prescriptions on File Prior to Visit  Medication Sig Dispense Refill  . albuterol (PROVENTIL HFA;VENTOLIN HFA) 108 (90 BASE) MCG/ACT inhaler Inhale 2 puffs into the lungs every 6 (six) hours as needed for wheezing.      Marland Kitchen b complex vitamins tablet Take 1 tablet by mouth daily.      . cholecalciferol (VITAMIN D) 1000 UNITS tablet Take 1,000 Units by mouth daily.      . Multiple Vitamins-Minerals (MULTIVITAMIN WITH MINERALS) tablet Take 1 tablet by mouth daily.      . peppermint oil liquid 1 capsule by Does not apply route as needed.      . vitamin C (ASCORBIC ACID) 500 MG tablet Take 500 mg by mouth daily.       No current facility-administered  medications on file prior to visit.    Past Surgical History  Procedure Laterality Date  . Vaginal hysterectomy  03-18-1999  . Cholecystectomy  1992  . Appendectomy  1998  . Bilateral carpal tunnel release  1980'S  . Transthoracic echocardiogram  04-26-2009    NORMAL LVSF/ EF 60-65% / LVDF NORMAL / NO PERICARDIAL EFFUSION  . Knee arthroscopy  06/20/2011    Procedure: ARTHROSCOPY KNEE;  Surgeon: Shelda Pal, MD;  Location: [facility];  Service: Orthopedics;  Laterality: Left;  left knee scope   latex allergy patient blisters and swells  . Breast reduction surgery Bilateral 12/21/2012    Procedure: BILATERAL MAMMARY REDUCTION  (BREAST);  Surgeon: Etter Sjogren, MD;  Location: [facility];  Service: Plastics;  Laterality: Bilateral;    Allergies  Allergen Reactions  . Latex Swelling    AND BLISTERS  . Sulfa Antibiotics Anaphylaxis    History   Social History  . Marital Status: Married    Spouse Name: N/A    Number of Children: N/A  . Years of Education: N/A   Occupational History  . Not on file.   Social History Main Topics  . Smoking status: Never Smoker   . Smokeless tobacco: Never Used  . Alcohol Use: No  . Drug Use: No  . Sexual Activity: Yes    Birth  Control/ Protection: Surgical   Other Topics Concern  . Not on file   Social History Narrative  . No narrative on file    Family History  Problem Relation Age of Onset  . Cancer Brother   . Asthma Son     BP 128/81  Pulse 75  Ht  (1.626 m)  Review of Systems: See HPI above.    Objective:  Physical Exam:  Gen: NAD  Right knee: Mod effusion.  No bruising, other deformity. TTP medial joint line.  No other tenderness. FROM with pain on full flexion. Negative ant/post drawers. Negative valgus/varus testing. Negative lachmanns. Positive mcmurrays, apleys.  Negative patellar apprehension. NV intact distally.    Assessment & Plan:  1. Right knee pain - concerning  for contusion or flare of prior medial meniscal tear.  Continue with tylenol, nsaids, icing, quad strengthening.  Knee also aspirated/injected today.  F/u in 4 weeks.  After informed written consent, patient was lying supine on exam table. Right knee was prepped with alcohol swab and utilizing superolateral approach with ultrasound guidance, 20mL clear yellow fluid aspirated from right knee then knee injected intraarticularly with 3:1 marcaine: depomedrol. Patient tolerated the procedure well without immediate complications.

## 2013-11-09 NOTE — Assessment & Plan Note (Signed)
concerning for contusion or flare of prior medial meniscal tear.  Continue with tylenol, nsaids, icing, quad strengthening.  Knee also aspirated/injected today.  F/u in 4 weeks.  After informed written consent, patient was lying supine on exam table. Right knee was prepped with alcohol swab and utilizing superolateral approach with ultrasound guidance, 20mL clear yellow fluid aspirated from right knee then knee injected intraarticularly with 3:1 marcaine: depomedrol. Patient tolerated the procedure well without immediate complications.

## 2013-11-22 ENCOUNTER — Other Ambulatory Visit: Payer: Self-pay | Admitting: Occupational Medicine

## 2013-11-22 DIAGNOSIS — M25561 Pain in right knee: Secondary | ICD-10-CM

## 2013-11-23 ENCOUNTER — Encounter: Payer: Self-pay | Admitting: *Deleted

## 2013-11-28 ENCOUNTER — Ambulatory Visit (HOSPITAL_COMMUNITY)
Admission: RE | Admit: 2013-11-28 | Discharge: 2013-11-28 | Disposition: A | Discharge status: Home / Self Care | Payer: PRIVATE HEALTH INSURANCE | Source: Ambulatory Visit | Attending: Occupational Medicine | Admitting: Occupational Medicine

## 2013-11-28 DIAGNOSIS — M25561 Pain in right knee: Secondary | ICD-10-CM | POA: Diagnosis present

## 2013-11-28 DIAGNOSIS — M7989 Other specified soft tissue disorders: Secondary | ICD-10-CM | POA: Diagnosis not present

## 2013-12-09 ENCOUNTER — Other Ambulatory Visit: Payer: Self-pay

## 2013-12-13 ENCOUNTER — Ambulatory Visit: Payer: 59 | Admitting: Family Medicine

## 2014-02-21 ENCOUNTER — Encounter (HOSPITAL_BASED_OUTPATIENT_CLINIC_OR_DEPARTMENT_OTHER)
Admission: RE | Admit: 2014-02-21 | Discharge: 2014-02-21 | Disposition: A | Discharge status: Home / Self Care | Payer: 59 | Source: Ambulatory Visit | Attending: Orthopedic Surgery | Admitting: Orthopedic Surgery

## 2014-02-21 ENCOUNTER — Encounter (HOSPITAL_BASED_OUTPATIENT_CLINIC_OR_DEPARTMENT_OTHER): Payer: Self-pay | Admitting: *Deleted

## 2014-02-21 DIAGNOSIS — Z91040 Latex allergy status: Secondary | ICD-10-CM | POA: Diagnosis not present

## 2014-02-21 DIAGNOSIS — J45909 Unspecified asthma, uncomplicated: Secondary | ICD-10-CM | POA: Diagnosis not present

## 2014-02-21 DIAGNOSIS — Z90710 Acquired absence of both cervix and uterus: Secondary | ICD-10-CM | POA: Diagnosis not present

## 2014-02-21 DIAGNOSIS — Z882 Allergy status to sulfonamides status: Secondary | ICD-10-CM | POA: Diagnosis not present

## 2014-02-21 DIAGNOSIS — M1711 Unilateral primary osteoarthritis, right knee: Secondary | ICD-10-CM | POA: Diagnosis not present

## 2014-02-21 LAB — SURGICAL PCR SCREEN
MRSA, PCR: NEGATIVE
STAPHYLOCOCCUS AUREUS: NEGATIVE

## 2014-03-03 ENCOUNTER — Ambulatory Visit (HOSPITAL_BASED_OUTPATIENT_CLINIC_OR_DEPARTMENT_OTHER): Payer: PRIVATE HEALTH INSURANCE | Admitting: Anesthesiology

## 2014-03-03 ENCOUNTER — Ambulatory Visit (HOSPITAL_COMMUNITY): Payer: PRIVATE HEALTH INSURANCE

## 2014-03-03 ENCOUNTER — Ambulatory Visit (HOSPITAL_BASED_OUTPATIENT_CLINIC_OR_DEPARTMENT_OTHER)
Admission: RE | Admit: 2014-03-03 | Discharge: 2014-03-04 | Disposition: A | Discharge status: Home / Self Care | Payer: PRIVATE HEALTH INSURANCE | Source: Ambulatory Visit | Attending: Orthopedic Surgery | Admitting: Orthopedic Surgery

## 2014-03-03 ENCOUNTER — Encounter (HOSPITAL_BASED_OUTPATIENT_CLINIC_OR_DEPARTMENT_OTHER): Payer: Self-pay | Admitting: Anesthesiology

## 2014-03-03 ENCOUNTER — Encounter (HOSPITAL_BASED_OUTPATIENT_CLINIC_OR_DEPARTMENT_OTHER)
Admission: RE | Disposition: A | Discharge status: Home / Self Care | Payer: Self-pay | Source: Ambulatory Visit | Attending: Orthopedic Surgery

## 2014-03-03 DIAGNOSIS — Z91040 Latex allergy status: Secondary | ICD-10-CM | POA: Insufficient documentation

## 2014-03-03 DIAGNOSIS — M1711 Unilateral primary osteoarthritis, right knee: Secondary | ICD-10-CM | POA: Insufficient documentation

## 2014-03-03 DIAGNOSIS — Z90710 Acquired absence of both cervix and uterus: Secondary | ICD-10-CM | POA: Insufficient documentation

## 2014-03-03 DIAGNOSIS — Z96651 Presence of right artificial knee joint: Secondary | ICD-10-CM

## 2014-03-03 DIAGNOSIS — J45909 Unspecified asthma, uncomplicated: Secondary | ICD-10-CM | POA: Insufficient documentation

## 2014-03-03 DIAGNOSIS — Z882 Allergy status to sulfonamides status: Secondary | ICD-10-CM | POA: Insufficient documentation

## 2014-03-03 HISTORY — DX: Unilateral primary osteoarthritis, right knee: M17.11

## 2014-03-03 HISTORY — PX: PARTIAL KNEE ARTHROPLASTY: SHX2174

## 2014-03-03 LAB — POCT HEMOGLOBIN-HEMACUE: HEMOGLOBIN: 12.8 g/dL (ref 12.0–15.0)

## 2014-03-03 SURGERY — ARTHROPLASTY, KNEE, UNICOMPARTMENTAL
Anesthesia: General | Site: Knee | Laterality: Right

## 2014-03-03 MED ORDER — OXYCODONE HCL 5 MG PO TABS
5.0000 mg | ORAL_TABLET | ORAL | Status: DC | PRN
  Administered 2014-03-04: 10 mg via ORAL
  Filled 2014-03-03: qty 2

## 2014-03-03 MED ORDER — OXYCODONE HCL 5 MG/5ML PO SOLN: 5.0000 mg | Freq: Once | ORAL | Status: AC | PRN

## 2014-03-03 MED ORDER — ALBUTEROL SULFATE HFA 108 (90 BASE) MCG/ACT IN AERS: 2.0000 | INHALATION_SPRAY | Freq: Four times a day (QID) | RESPIRATORY_TRACT | Status: DC | PRN

## 2014-03-03 MED ORDER — SENNA 8.6 MG PO TABS: 1.0000 | ORAL_TABLET | Freq: Two times a day (BID) | ORAL | Status: DC

## 2014-03-03 MED ORDER — METHOCARBAMOL 1000 MG/10ML IJ SOLN: 500.0000 mg | Freq: Four times a day (QID) | INTRAVENOUS | Status: DC | PRN

## 2014-03-03 MED ORDER — PROPOFOL 10 MG/ML IV BOLUS
INTRAVENOUS | Status: DC | PRN
  Administered 2014-03-03: 280 mg via INTRAVENOUS

## 2014-03-03 MED ORDER — MIDAZOLAM HCL 2 MG/2ML IJ SOLN
INTRAMUSCULAR | Status: AC
  Filled 2014-03-03: qty 2

## 2014-03-03 MED ORDER — LACTATED RINGERS IV SOLN
INTRAVENOUS | Status: DC
  Administered 2014-03-03: 13:00:00 via INTRAVENOUS

## 2014-03-03 MED ORDER — VITAMIN C 500 MG PO TABS: 500.0000 mg | ORAL_TABLET | Freq: Every day | ORAL | Status: DC

## 2014-03-03 MED ORDER — LIDOCAINE HCL (CARDIAC) 20 MG/ML IV SOLN
INTRAVENOUS | Status: DC | PRN
  Administered 2014-03-03: 50 mg via INTRAVENOUS

## 2014-03-03 MED ORDER — RIVAROXABAN 10 MG PO TABS: 10.0000 mg | ORAL_TABLET | Freq: Every day | ORAL | Status: DC

## 2014-03-03 MED ORDER — CEFAZOLIN SODIUM-DEXTROSE 2-3 GM-% IV SOLR
INTRAVENOUS | Status: AC
  Filled 2014-03-03: qty 50

## 2014-03-03 MED ORDER — ONDANSETRON HCL 4 MG PO TABS: 4.0000 mg | ORAL_TABLET | Freq: Four times a day (QID) | ORAL | Status: DC | PRN

## 2014-03-03 MED ORDER — OXYCODONE HCL 5 MG PO TABS: 5.0000 mg | ORAL_TABLET | Freq: Once | ORAL | Status: AC | PRN

## 2014-03-03 MED ORDER — FENTANYL CITRATE 0.05 MG/ML IJ SOLN
50.0000 ug | INTRAMUSCULAR | Status: DC | PRN
  Administered 2014-03-03: 100 ug via INTRAVENOUS

## 2014-03-03 MED ORDER — BACLOFEN 10 MG PO TABS
10.0000 mg | ORAL_TABLET | Freq: Three times a day (TID) | ORAL | Status: DC
Start: 2014-03-03 — End: 2015-03-21

## 2014-03-03 MED ORDER — MAGNESIUM CITRATE PO SOLN: 1.0000 | Freq: Once | ORAL | Status: AC | PRN

## 2014-03-03 MED ORDER — BISACODYL 10 MG RE SUPP: 10.0000 mg | Freq: Every day | RECTAL | Status: DC | PRN

## 2014-03-03 MED ORDER — VITAMIN D3 25 MCG (1000 UNIT) PO TABS: 1000.0000 [IU] | ORAL_TABLET | Freq: Every day | ORAL | Status: DC

## 2014-03-03 MED ORDER — OXYCODONE-ACETAMINOPHEN 5-325 MG PO TABS
1.0000 | ORAL_TABLET | ORAL | Status: DC | PRN
  Administered 2014-03-03 (×2): 1 via ORAL
  Administered 2014-03-04: 2 via ORAL
  Filled 2014-03-03 (×2): qty 2

## 2014-03-03 MED ORDER — METOCLOPRAMIDE HCL 5 MG PO TABS: 5.0000 mg | ORAL_TABLET | Freq: Three times a day (TID) | ORAL | Status: DC | PRN

## 2014-03-03 MED ORDER — CEFAZOLIN SODIUM 1-5 GM-% IV SOLN
1.0000 g | Freq: Four times a day (QID) | INTRAVENOUS | Status: DC
  Administered 2014-03-03 – 2014-03-04 (×2): 1 g via INTRAVENOUS

## 2014-03-03 MED ORDER — SENNA-DOCUSATE SODIUM 8.6-50 MG PO TABS: 2.0000 | ORAL_TABLET | Freq: Every day | ORAL | Status: DC

## 2014-03-03 MED ORDER — ZOLPIDEM TARTRATE 5 MG PO TABS: 5.0000 mg | ORAL_TABLET | Freq: Every evening | ORAL | Status: DC | PRN

## 2014-03-03 MED ORDER — ONDANSETRON HCL 4 MG/2ML IJ SOLN
4.0000 mg | Freq: Four times a day (QID) | INTRAMUSCULAR | Status: DC | PRN
  Administered 2014-03-03: 4 mg via INTRAVENOUS

## 2014-03-03 MED ORDER — HYDROMORPHONE HCL 1 MG/ML IJ SOLN
0.2500 mg | INTRAMUSCULAR | Status: DC | PRN
  Administered 2014-03-03 – 2014-03-04 (×7): 0.5 mg via INTRAVENOUS
  Filled 2014-03-03 (×2): qty 1

## 2014-03-03 MED ORDER — FENTANYL CITRATE 0.05 MG/ML IJ SOLN
INTRAMUSCULAR | Status: AC
  Filled 2014-03-03: qty 2

## 2014-03-03 MED ORDER — HYDROMORPHONE HCL 1 MG/ML IJ SOLN
INTRAMUSCULAR | Status: AC
  Filled 2014-03-03: qty 1

## 2014-03-03 MED ORDER — CEFAZOLIN SODIUM 1-5 GM-% IV SOLN
INTRAVENOUS | Status: AC
  Filled 2014-03-03: qty 100

## 2014-03-03 MED ORDER — MIDAZOLAM HCL 2 MG/2ML IJ SOLN
1.0000 mg | INTRAMUSCULAR | Status: DC | PRN
  Administered 2014-03-03: 2 mg via INTRAVENOUS

## 2014-03-03 MED ORDER — OXYCODONE-ACETAMINOPHEN 10-325 MG PO TABS: 1.0000 | ORAL_TABLET | Freq: Four times a day (QID) | ORAL | Status: DC | PRN

## 2014-03-03 MED ORDER — ONDANSETRON HCL 4 MG/2ML IJ SOLN
INTRAMUSCULAR | Status: DC | PRN
  Administered 2014-03-03: 4 mg via INTRAVENOUS

## 2014-03-03 MED ORDER — SODIUM CHLORIDE 0.9 % IV SOLN
INTRAVENOUS | Status: DC
  Administered 2014-03-03: 19:00:00 via INTRAVENOUS

## 2014-03-03 MED ORDER — FENTANYL CITRATE 0.05 MG/ML IJ SOLN
INTRAMUSCULAR | Status: DC | PRN
  Administered 2014-03-03: 100 ug via INTRAVENOUS

## 2014-03-03 MED ORDER — DEXAMETHASONE SODIUM PHOSPHATE 4 MG/ML IJ SOLN
INTRAMUSCULAR | Status: DC | PRN
  Administered 2014-03-03: 10 mg via INTRAVENOUS

## 2014-03-03 MED ORDER — POLYETHYLENE GLYCOL 3350 17 G PO PACK: 17.0000 g | PACK | Freq: Every day | ORAL | Status: DC | PRN

## 2014-03-03 MED ORDER — MORPHINE SULFATE 10 MG/ML IJ SOLN
INTRAMUSCULAR | Status: AC
  Filled 2014-03-03: qty 1

## 2014-03-03 MED ORDER — ONDANSETRON HCL 4 MG PO TABS: 4.0000 mg | ORAL_TABLET | Freq: Three times a day (TID) | ORAL | Status: DC | PRN

## 2014-03-03 MED ORDER — METHOCARBAMOL 500 MG PO TABS
500.0000 mg | ORAL_TABLET | Freq: Four times a day (QID) | ORAL | Status: DC | PRN
  Administered 2014-03-03 – 2014-03-04 (×2): 500 mg via ORAL
  Filled 2014-03-03 (×2): qty 1

## 2014-03-03 MED ORDER — CEFAZOLIN SODIUM-DEXTROSE 2-3 GM-% IV SOLR
2.0000 g | INTRAVENOUS | Status: AC
  Administered 2014-03-03: 2 g via INTRAVENOUS

## 2014-03-03 MED ORDER — MORPHINE SULFATE 10 MG/ML IJ SOLN
INTRAMUSCULAR | Status: DC | PRN
Start: 2014-03-03 — End: 2014-03-03
  Administered 2014-03-03 (×3): 2 mg via INTRAVENOUS

## 2014-03-03 MED ORDER — DOCUSATE SODIUM 100 MG PO CAPS: 100.0000 mg | ORAL_CAPSULE | Freq: Two times a day (BID) | ORAL | Status: DC

## 2014-03-03 MED ORDER — ONDANSETRON HCL 4 MG/2ML IJ SOLN
4.0000 mg | Freq: Once | INTRAMUSCULAR | Status: AC | PRN
  Filled 2014-03-03: qty 2

## 2014-03-03 MED ORDER — METOCLOPRAMIDE HCL 5 MG/ML IJ SOLN: 5.0000 mg | Freq: Three times a day (TID) | INTRAMUSCULAR | Status: DC | PRN

## 2014-03-03 SURGICAL SUPPLY — 70 items
BANDAGE ELASTIC 6 VELCRO ST LF (GAUZE/BANDAGES/DRESSINGS) ×3 IMPLANT
BANDAGE ESMARK 6X9 LF (GAUZE/BANDAGES/DRESSINGS) ×1 IMPLANT
BLADE SURG 10 STRL SS (BLADE) ×3 IMPLANT
BLADE SURG 15 STRL LF DISP TIS (BLADE) ×2 IMPLANT
BLADE SURG 15 STRL SS (BLADE) ×6
BNDG CMPR 9X6 STRL LF SNTH (GAUZE/BANDAGES/DRESSINGS) ×1
BNDG ESMARK 6X9 LF (GAUZE/BANDAGES/DRESSINGS) ×3
BOWL SMART MIX CTS (DISPOSABLE) ×3 IMPLANT
Biomet anatomical meniscal bearing IMPLANT
CANISTER SUCT 1200ML W/VALVE (MISCELLANEOUS) IMPLANT
CANISTER SUCTION 2500CC (MISCELLANEOUS) ×1 IMPLANT
CAPT KNEE PARTIAL 2 ×2 IMPLANT
CEMENT HV SMART SET (Cement) ×3 IMPLANT
CLOSURE STERI-STRIP 1/2X4 (GAUZE/BANDAGES/DRESSINGS) ×1
CLSR STERI-STRIP ANTIMIC 1/2X4 (GAUZE/BANDAGES/DRESSINGS) ×2 IMPLANT
COVER BACK TABLE 60X90IN (DRAPES) ×3 IMPLANT
CUFF TOURNIQUET SINGLE 34IN LL (TOURNIQUET CUFF) ×2 IMPLANT
DECANTER SPIKE VIAL GLASS SM (MISCELLANEOUS) IMPLANT
DRAPE EXTREMITY T 121X128X90 (DRAPE) ×3 IMPLANT
DRAPE U 20/CS (DRAPES) ×3 IMPLANT
DRAPE U-SHAPE 47X51 STRL (DRAPES) ×3 IMPLANT
DRSG PAD ABDOMINAL 8X10 ST (GAUZE/BANDAGES/DRESSINGS) ×3 IMPLANT
DURAPREP 26ML APPLICATOR (WOUND CARE) ×3 IMPLANT
ELECT REM PT RETURN 9FT ADLT (ELECTROSURGICAL) ×3
ELECTRODE REM PT RTRN 9FT ADLT (ELECTROSURGICAL) ×1 IMPLANT
FACESHIELD WRAPAROUND (MASK) ×6 IMPLANT
FACESHIELD WRAPAROUND OR TEAM (MASK) ×2 IMPLANT
GAUZE SPONGE 4X4 12PLY STRL (GAUZE/BANDAGES/DRESSINGS) ×3 IMPLANT
GLOVE BIO SURGEON STRL SZ8 (GLOVE) ×3 IMPLANT
GLOVE BIOGEL PI IND STRL 7.0 (GLOVE) IMPLANT
GLOVE BIOGEL PI IND STRL 8 (GLOVE) ×2 IMPLANT
GLOVE BIOGEL PI INDICATOR 7.0 (GLOVE) ×2
GLOVE BIOGEL PI INDICATOR 8 (GLOVE) ×4
GLOVE ECLIPSE 6.5 STRL STRAW (GLOVE) ×2 IMPLANT
GLOVE ORTHO TXT STRL SZ7.5 (GLOVE) ×3 IMPLANT
GLOVE SURG SS PI 6.5 STRL IVOR (GLOVE) ×2 IMPLANT
GLOVE SURG SS PI 7.5 STRL IVOR (GLOVE) ×2 IMPLANT
GLOVE SURG SS PI 8.0 STRL IVOR (GLOVE) ×2 IMPLANT
GOWN STRL REUS W/ TWL LRG LVL3 (GOWN DISPOSABLE) ×1 IMPLANT
GOWN STRL REUS W/ TWL XL LVL3 (GOWN DISPOSABLE) ×2 IMPLANT
GOWN STRL REUS W/TWL LRG LVL3 (GOWN DISPOSABLE) ×3
GOWN STRL REUS W/TWL XL LVL3 (GOWN DISPOSABLE) ×6
HANDPIECE INTERPULSE COAX TIP (DISPOSABLE) ×3
IMMOBILIZER KNEE 22 UNIV (SOFTGOODS) ×3 IMPLANT
IMMOBILIZER KNEE 24 THIGH 36 (MISCELLANEOUS) ×1 IMPLANT
IMMOBILIZER KNEE 24 UNIV (MISCELLANEOUS) ×3
MANIFOLD NEPTUNE II (INSTRUMENTS) ×2 IMPLANT
NS IRRIG 1000ML POUR BTL (IV SOLUTION) ×3 IMPLANT
PACK ARTHROSCOPY DSU (CUSTOM PROCEDURE TRAY) ×3 IMPLANT
PACK BASIN DAY SURGERY FS (CUSTOM PROCEDURE TRAY) ×3 IMPLANT
PENCIL BUTTON HOLSTER BLD 10FT (ELECTRODE) ×3 IMPLANT
SAWBLADE OXFORD PARTIAL (BLADE) ×3 IMPLANT
SET HNDPC FAN SPRY TIP SCT (DISPOSABLE) ×1 IMPLANT
SHEET MEDIUM DRAPE 40X70 STRL (DRAPES) ×3 IMPLANT
SLEEVE SCD COMPRESS KNEE MED (MISCELLANEOUS) ×3 IMPLANT
SPONGE LAP 18X18 X RAY DECT (DISPOSABLE) ×3 IMPLANT
SUCTION FRAZIER TIP 10 FR DISP (SUCTIONS) ×3 IMPLANT
SUT MNCRL AB 4-0 PS2 18 (SUTURE) IMPLANT
SUT VIC AB 0 CT1 27 (SUTURE) ×3
SUT VIC AB 0 CT1 27XBRD ANBCTR (SUTURE) ×1 IMPLANT
SUT VIC AB 2-0 SH 27 (SUTURE)
SUT VIC AB 2-0 SH 27XBRD (SUTURE) ×1 IMPLANT
SUT VICRYL 3-0 CR8 SH (SUTURE) ×3 IMPLANT
SUT VICRYL 4-0 PS2 18IN ABS (SUTURE) IMPLANT
SYR BULB IRRIGATION 50ML (SYRINGE) ×3 IMPLANT
TOWEL OR 17X24 6PK STRL BLUE (TOWEL DISPOSABLE) ×3 IMPLANT
TOWEL OR NON WOVEN STRL DISP B (DISPOSABLE) ×6 IMPLANT
TUBE CONNECTING 20'X1/4 (TUBING)
TUBE CONNECTING 20X1/4 (TUBING) IMPLANT
YANKAUER SUCT BULB TIP NO VENT (SUCTIONS) ×2 IMPLANT

## 2014-03-03 NOTE — H&P (Signed)
PREOPERATIVE H&P  Chief Complaint: unilateral primary osteoarthritis right knee  HPI: Dawn [name] is a 49 y.o. female who presents for preoperative history and physical with a diagnosis of unilateral primary osteoarthritis right knee. Symptoms are rated as moderate to severe, and have been worsening.  This is significantly impairing activities of daily living.  She has elected for surgical management. She has failed injections, activity modification, anti-inflammatories, and assistive devices.  Preoperative X-rays demonstrate end stage degenerative changes with osteophyte formation, loss of joint space, subchondral sclerosis.   Past Medical History  Diagnosis Date  . Acute meniscal tear of knee LEFT  . Seasonal allergies   . Environmental allergies   . History of gastric ulcer AS TEEN  . 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 Arthritis   . Asthma   . Contact lens/glasses fitting     wears contacts or glasses   Past Surgical History  Procedure Laterality Date  . Vaginal hysterectomy  03-18-1999  . Cholecystectomy  1992  . Appendectomy  1998  . Bilateral carpal tunnel release  1980'S  . Transthoracic echocardiogram  04-26-2009    NORMAL LVSF/ EF 60-65% / LVDF NORMAL / NO PERICARDIAL EFFUSION  . Knee arthroscopy  06/20/2011    Procedure: ARTHROSCOPY KNEE;  Surgeon: Shelda PalMatthew D Olin, MD;  [facility] [place];  Service: Orthopedics;  Laterality: Left;  left knee scope   latex allergy patient blisters and swells  . Breast reduction surgery Bilateral 12/21/2012    Procedure: BILATERAL MAMMARY REDUCTION  (BREAST);  Surgeon: Etter Sjogrenavid Bowers, MD;  Location: [facility];  Service: Plastics;  Laterality: Bilateral;   History   Social History  . Marital Status: Married    Spouse Name: N/A    Number of Children: N/A  . Years of Education: N/A   Social History Main Topics  . Smoking  status: Never Smoker   . Smokeless tobacco: Never Used  . Alcohol Use: No  . Drug Use: No  . Sexual Activity: Yes    Birth Control/ Protection: Surgical   Other Topics Concern  . None   Social History Narrative   Family History  Problem Relation Age of Onset  . Cancer Brother   . Asthma Son    Allergies  Allergen Reactions  . Latex Swelling    AND BLISTERS  . Sulfa Antibiotics Anaphylaxis   Prior to Admission medications   Medication Sig Start Date End Date Taking? Authorizing Provider  b complex vitamins tablet Take 1 tablet by mouth daily.   Yes Historical Provider, MD  cholecalciferol (VITAMIN D) 1000 UNITS tablet Take 1,000 Units by mouth daily.   Yes Historical Provider, MD  etodolac (LODINE) 400 MG tablet Take 400 mg by mouth 2 (two) times daily.   Yes Historical Provider, MD  Multiple Vitamins-Minerals (MULTIVITAMIN WITH MINERALS) tablet Take 1 tablet by mouth daily.   Yes Historical Provider, MD  peppermint oil liquid 1 capsule by Does not apply route as needed.   Yes Historical Provider, MD  vitamin C (ASCORBIC ACID) 500 MG tablet Take 500 mg by mouth daily.   Yes Historical Provider, MD  albuterol (PROVENTIL HFA;VENTOLIN HFA) 108 (90 BASE) MCG/ACT inhaler Inhale 2 puffs into the lungs every 6 (six) hours as needed for wheezing.    Historical Provider, MD     Positive ROS: All other systems have been reviewed and were otherwise negative with the exception  of those mentioned in the HPI and as above.  Physical Exam: General: Alert, no acute distress Cardiovascular: No pedal edema Respiratory: No cyanosis, no use of accessory musculature GI: No organomegaly, abdomen is soft and non-tender Skin: No lesions in the area of chief complaint Neurologic: Sensation intact distally Psychiatric: Patient is competent for consent with normal mood and affect Lymphatic: No axillary or cervical lymphadenopathy  MUSCULOSKELETAL: Right knee has positive pseudo-laxity with positive  medial joint line tenderness and range of motion 0-130 intact stability to Lachman testing. Lateral side is negative for tenderness and no patellar grind.  MRI demonstrates evidence for medial meniscus tear and substantial degenerative changes noted with extensive Hylan cartilage loss in the medial compartment and subchondral edema with matching lesions on the femur and tibia.  Assessment: unilateral primary osteoarthritis right knee  Plan: Plan for Procedure(s): RIGHT UNICOMPARTMENTAL KNEE ARTHROPLASTY  The risks benefits and alternatives were discussed with the patient including but not limited to the risks of nonoperative treatment, versus surgical intervention including infection, bleeding, nerve injury,  blood clots, cardiopulmonary complications, morbidity, mortality, among others, and they were willing to proceed.   Eulas Post, MD Cell [phone]   03/03/2014 7:44 AM

## 2014-03-03 NOTE — Anesthesia Preprocedure Evaluation (Signed)
Anesthesia Evaluation  Patient identified by MRN, date of birth, ID band Patient awake    Reviewed: Allergy & Precautions, NPO status , Patient's Chart, lab work & pertinent test results  Airway Mallampati: II  TM Distance: >3 FB Neck ROM: Full    Dental  (+) Teeth Intact   Pulmonary  breath sounds clear to auscultation        Cardiovascular Rhythm:Regular Rate:Normal     Neuro/Psych    GI/Hepatic   Endo/Other    Renal/GU      Musculoskeletal   Abdominal   Peds  Hematology   Anesthesia Other Findings   Reproductive/Obstetrics                             Anesthesia Physical Anesthesia Plan  ASA: II  Anesthesia Plan: General   Post-op Pain Management:    Induction: Intravenous  Airway Management Planned: LMA  Additional Equipment:   Intra-op Plan:   Post-operative Plan:   Informed Consent: I have reviewed the patients History and Physical, chart, labs and discussed the procedure including the risks, benefits and alternatives for the proposed anesthesia with the patient or authorized representative who has indicated his/her understanding and acceptance.   Dental advisory given  Plan Discussed with: CRNA and Anesthesiologist  Anesthesia Plan Comments:         Anesthesia Quick Evaluation

## 2014-03-03 NOTE — Discharge Instructions (Signed)
Diet: As you were doing prior to hospitalization  ° °Shower:  May shower but keep the wounds dry, use an occlusive plastic wrap, NO SOAKING IN TUB.  If the bandage gets wet, change with a clean dry gauze. ° °Dressing:  You may change your dressing 3-5 days after surgery.  Then change the dressing daily with sterile gauze dressing.   ° °There are sticky tapes (steri-strips) on your wounds and all the stitches are absorbable.  Leave the steri-strips in place when changing your dressings, they will peel off with time, usually 2-3 weeks. ° °Activity:  Increase activity slowly as tolerated, but follow the weight bearing instructions below.  No lifting or driving for 6 weeks. ° °Weight Bearing:   As tolerated.   ° °To prevent constipation: you may use a stool softener such as - ° °Colace (over the counter) 100 mg by mouth twice a day  °Drink plenty of fluids (prune juice may be helpful) and high fiber foods °Miralax (over the counter) for constipation as needed.   ° °Itching:  If you experience itching with your medications, try taking only a single pain pill, or even half a pain pill at a time.  You may take up to 10 pain pills per day, and you can also use benadryl over the counter for itching or also to help with sleep.  ° °Precautions:  If you experience chest pain or shortness of breath - call 911 immediately for transfer to the hospital emergency department!! ° °If you develop a fever greater that 101 F, purulent drainage from wound, increased redness or drainage from wound, or calf pain -- Call the office at [phone]                                                °Follow- Up Appointment:  Please call for an appointment to be seen in 2 weeks [place] - [phone] ° ° ° ° ° °

## 2014-03-03 NOTE — Transfer of Care (Signed)
Immediate Anesthesia Transfer of Care Note  Patient: Barron SchmidConstance M Goodie  Procedure(s) Performed: Procedure(s): RIGHT UNICOMPARTMENTAL KNEE ARTHROPLASTY (Right)  Patient Location: PACU  Anesthesia Type:General  Level of Consciousness: awake and sedated  Airway & Oxygen Therapy: Patient Spontanous Breathing and Patient connected to face mask oxygen  Post-op Assessment: Report given to PACU RN and Post -op Vital signs reviewed and stable  Post vital signs: Reviewed and stable  Complications: No apparent anesthesia complications

## 2014-03-03 NOTE — Progress Notes (Signed)
AssistedDr. Joslin with right, ultrasound guided, adductor canal block. Side rails up, monitors on throughout procedure. See vital signs in flow sheet. Tolerated Procedure well.  

## 2014-03-03 NOTE — Anesthesia Postprocedure Evaluation (Signed)
  Anesthesia Post-op Note  Patient: Dawn [name]  Procedure(s) Performed: Procedure(s): RIGHT UNICOMPARTMENTAL KNEE ARTHROPLASTY (Right)  Patient Location: PACU  Anesthesia Type:General and GA combined with regional for post-op pain  Level of Consciousness: awake, alert  and oriented  Airway and Oxygen Therapy: Patient Spontanous Breathing and Patient connected to nasal cannula oxygen  Post-op Pain: mild  Post-op Assessment: Post-op Vital signs reviewed, Patient's Cardiovascular Status Stable, Respiratory Function Stable, Patent Airway and Pain level controlled  Post-op Vital Signs: stable  Last Vitals:  Filed Vitals:   03/03/14 1700  BP: 155/78  Pulse: 68  Temp:   Resp: 13    Complications: No apparent anesthesia complications

## 2014-03-03 NOTE — Op Note (Signed)
03/03/2014  4:03 PM  PATIENT:  Dawn [name]    PRE-OPERATIVE DIAGNOSIS:  unilateral primary osteoarthritis right knee  POST-OPERATIVE DIAGNOSIS:  Same  PROCEDURE:  RIGHT UNICOMPARTMENTAL KNEE ARTHROPLASTY  SURGEON:  Eulas Post, MD  PHYSICIAN ASSISTANT: Janace Litten, OPA-C, present and scrubbed throughout the case, critical for completion in a timely fashion, and for retraction, instrumentation, and closure.  ANESTHESIA:   General  PREOPERATIVE INDICATIONS:  Dawn [name] is a  49 y.o. female with a diagnosis of unilateral primary osteoarthritis right knee who failed conservative measures and elected for surgical management.    The risks benefits and alternatives were discussed with the patient preoperatively including but not limited to the risks of infection, bleeding, nerve injury, cardiopulmonary complications, blood clots, the need for revision surgery, among others, and the patient was willing to proceed.  OPERATIVE IMPLANTS: Biomet Oxford mobile bearing medial compartment arthroplasty femur size small, tibia size B, bearing size 5.  OPERATIVE FINDINGS: Endstage grade 3-4 medial compartment osteoarthritis. No significant changes in the lateral or patellofemoral joint.  The ACL was intact.  OPERATIVE PROCEDURE: The patient was brought to the operating room placed in supine position. General anesthesia was administered. IV antibiotics were given. The lower extremity was placed in the legholder and prepped and draped in usual sterile fashion.  Time out was performed.  The leg was elevated and exsanguinated and the tourniquet was inflated. Anteromedial incision was performed, and I took care to preserve the MCL. Parapatellar incision was carried out, and the osteophytes were excised, along with the medial meniscus and a small portion of the fat pad.  The extra medullary tibial cutting jig was applied, using the spoon and the 4mm G-Clamp and the 0 mm shim, and I took  care to protect the anterior cruciate ligament insertion and the tibial spine. The medial collateral ligament was also protected, and I resected my proximal tibia, matching the anatomic slope.   The proximal tibial bony cut was removed in one piece, and I turned my attention to the femur.  The intramedullary femoral rod was placed using the drill, and then using the appropriate reference, I assembled the femoral jig, setting my posterior cutting block. I resected my posterior femur, used the 0 spigot for the anterior femur, and then measured my gap.   I then used the appropriate mill to match the extension gap to the flexion gap. The second milling was at a 3.  The gaps were then measured again with the appropriate feeler gauges. Once I had balanced flexion and extension gaps, I then completed the preparation of the femur.  I milled off the anterior aspect of the distal femur to prevent impingement. I also exposed the tibia, and selected the above-named component, and then used the cutting jig to prepare the keel slot on the tibia. I also used the awl to curette out the bone to complete the preparation of the keel. The back wall was intact.  I then placed trial components, and it was found to have excellent motion, and appropriate balance.  I then cemented the components into place, cementing the tibia first, removing all excess cement, and then cementing the femur.  All loose cement was removed.  The real polyethylene insert was applied manually, and the knee was taken through functional range of motion, and found to have excellent stability and restoration of joint motion, with excellent balance.  The wounds were irrigated copiously, and the parapatellar tissue closed with Vicryl, followed by Vicryl for  the subcutaneous tissue, with routine closure with Steri-Strips and sterile gauze.  The tourniquet was released, and the patient was awakened and extubated and returned to PACU in stable and  satisfactory condition. There were no complications.

## 2014-03-03 NOTE — Anesthesia Procedure Notes (Addendum)
Procedure Name: LMA Insertion Performed by: York GricePEARSON, DONNA W Pre-anesthesia Checklist: Patient identified, Timeout performed, Emergency Drugs available, Suction available and Patient being monitored Patient Re-evaluated:Patient Re-evaluated prior to inductionOxygen Delivery Method: Circle system utilized Preoxygenation: Pre-oxygenation with 100% oxygen Intubation Type: IV induction Ventilation: Mask ventilation without difficulty LMA: LMA inserted LMA Size: 4.0 Tube type: Oral Number of attempts: 1 Placement Confirmation: positive ETCO2 and breath sounds checked- equal and bilateral Tube secured with: Tape Dental Injury: Teeth and Oropharynx as per pre-operative assessment    Anesthesia Regional Block:  Adductor canal block  Pre-Anesthetic Checklist: ,, timeout performed, Correct Patient, Correct Site, Correct Laterality, Correct Procedure, Correct Position, site marked, Risks and benefits discussed,  Surgical consent,  Pre-op evaluation,  At surgeon's request and post-op pain management  Laterality: Right  Prep: chloraprep       Needles:   Needle Type: Echogenic Stimulator Needle     Needle Length: 9cm 9 cm Needle Gauge: 22 and 22 G    Additional Needles:  Procedures: ultrasound guided (picture in chart) Adductor canal block Narrative:  Start time: 03/03/2014 2:00 PM End time: 03/03/2014 2:05 PM Injection made incrementally with aspirations every 5 mL.  Performed by: Personally   Additional Notes: 30 cc 0.5% Marcaine with 1:200 epi injected easily

## 2014-03-04 DIAGNOSIS — M1711 Unilateral primary osteoarthritis, right knee: Secondary | ICD-10-CM | POA: Diagnosis not present

## 2014-03-07 ENCOUNTER — Encounter (HOSPITAL_BASED_OUTPATIENT_CLINIC_OR_DEPARTMENT_OTHER): Payer: Self-pay | Admitting: Orthopedic Surgery

## 2014-03-28 ENCOUNTER — Telehealth: Payer: Self-pay | Admitting: *Deleted

## 2014-03-28 ENCOUNTER — Ambulatory Visit: Payer: PRIVATE HEALTH INSURANCE | Attending: Orthopedic Surgery | Admitting: Physical Therapy

## 2014-03-28 DIAGNOSIS — Z96651 Presence of right artificial knee joint: Secondary | ICD-10-CM

## 2014-03-28 DIAGNOSIS — M25661 Stiffness of right knee, not elsewhere classified: Secondary | ICD-10-CM | POA: Diagnosis not present

## 2014-03-28 DIAGNOSIS — M6281 Muscle weakness (generalized): Secondary | ICD-10-CM | POA: Diagnosis not present

## 2014-03-28 DIAGNOSIS — R269 Unspecified abnormalities of gait and mobility: Secondary | ICD-10-CM

## 2014-03-28 NOTE — Therapy (Signed)
[facility] [address] [place], [place], [address] Phone: [phone]   Fax:  [phone]  Physical Therapy Evaluation  Patient Details  Name: Dawn SchmidConstance M [name] MRN: [id number] Date of Birth: 12/24/1965 Referring Provider:  Eulas PostLandau, Joshua P, MD  Encounter Date: 03/28/2014      PT End of Session - 03/28/14 1332    Visit Number 1   Number of Visits 24   Date for PT Re-Evaluation 05/27/14   PT Start Time 1015   PT Stop Time 1055   PT Time Calculation (min) 40 min   Activity Tolerance Patient tolerated treatment well;Patient limited by pain   Behavior During Therapy [facility] for tasks assessed/performed      Past Medical History  Diagnosis Date  . Acute meniscal tear of knee LEFT  . Seasonal allergies   . Environmental allergies   . History of gastric ulcer AS TEEN  . 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 Arthritis   . Asthma   . Contact lens/glasses fitting     wears contacts or glasses  . Primary localized osteoarthritis of right knee 11/25/2011    S/P Left Knee arthroscopy performed by Dr. Charlann Boxerlin in April 2013.     Past Surgical History  Procedure Laterality Date  . Vaginal hysterectomy  03-18-1999  . Cholecystectomy  1992  . Appendectomy  1998  . Bilateral carpal tunnel release  1980'S  . Transthoracic echocardiogram  04-26-2009    NORMAL LVSF/ EF 60-65% / LVDF NORMAL / NO PERICARDIAL EFFUSION  . Knee arthroscopy  06/20/2011    Procedure: ARTHROSCOPY KNEE;  Surgeon: Shelda PalMatthew D Olin, MD;  [facility] [place];  Service: Orthopedics;  Laterality: Left;  left knee scope   latex allergy patient blisters and swells  . Breast reduction surgery Bilateral 12/21/2012    Procedure: BILATERAL MAMMARY REDUCTION  (BREAST);  Surgeon: Etter Sjogrenavid Bowers, MD;  Location: [facility];  Service: Plastics;  Laterality: Bilateral;  . Partial knee  arthroplasty Right 03/03/2014    Procedure: RIGHT UNICOMPARTMENTAL KNEE ARTHROPLASTY;  Surgeon: Eulas PostJoshua P Landau, MD;  Location: [facility];  Service: Orthopedics;  Laterality: Right;    There were no vitals taken for this visit.  Visit Diagnosis:  S/P right unicompartmental knee replacement - Plan: PT plan of care cert/re-cert  Knee stiffness, right - Plan: PT plan of care cert/re-cert  Abnormality of gait - Plan: PT plan of care cert/re-cert  Muscle weakness of lower extremity - Plan: PT plan of care cert/re-cert      Subjective Assessment - 03/28/14 1022    Symptoms Pt is a 49 y/o female who presents to OPPT s/p R uni knee arthroplasty.  Pt initial injury in May 2015 and underwent physical therapy.  Improving well until additional injury at work on 10/10/13 resulting in need for surgery.     Pertinent History asthma   Limitations Sitting;Standing;Walking;House hold activities   How long can you sit comfortably? 5 min   How long can you stand comfortably? 20-30 min   How long can you walk comfortably? > 10 min   Patient Stated Goals sit comfortably; improve motion and decrease pain   Currently in Pain? Yes   Pain Score 5    Pain Location Knee   Pain Orientation Right   Pain Descriptors / Indicators Aching   Pain Type Surgical pain   Pain Onset 1 to 4  weeks ago   Pain Frequency Constant   Aggravating Factors  sitting, bending knee   Pain Relieving Factors medication          [facility] PT Assessment - 03/28/14 1028    Assessment   Medical Diagnosis R uni knee arthroplasty   Onset Date 03/03/14   Next MD Visit 04/06/14   Prior Therapy PT before surgery and HHPT   Precautions   Precautions None   Restrictions   Weight Bearing Restrictions Yes   RLE Weight Bearing Weight bearing as tolerated   Balance Screen   Has the patient fallen in the past 6 months Yes   How many times? 1; knee gave out on Thanksgiving day   Has the patient had a decrease in activity level  because of a fear of falling?  Yes   Is the patient reluctant to leave their home because of a fear of falling?  Yes   Home Environment   Living Enviornment Private residence   Living Arrangements Spouse/significant other   Available Help at Discharge Family;Available PRN/intermittently   Type of Home House   Home Access Stairs to enter   Entrance Stairs-Number of Steps 3   Entrance Stairs-Rails None   Home Layout Two level;Bed/bath upstairs   Alternate Level Stairs-Number of Steps 13   Alternate Level Stairs-Rails Right   Home Equipment Walker - 2 wheels;Cane - single point;Bedside commode   Prior Function   Level of Independence Independent with basic ADLs;Independent with gait;Independent with transfers   Vocation Workers comp   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[employer]; "I Loss adjuster, chartered"  classroom environment with standing; moving equipment, walking   Leisure coloring   Cognition   Overall Cognitive Status Within Functional Limits for tasks assessed   Observation/Other Assessments   Skin Integrity healing incision without any significant abnormalities   Focus on Therapeutic Outcomes (FOTO)  30 (70% limited; predicted 44% limited)   AROM   Right Knee Extension 13   Right Knee Flexion 58   Left Knee Extension 0   Left Knee Flexion 120   Strength   Overall Strength Comments strength testing deferred for hip/knee due to difficulty tolerating testing positions however suspect hip/knee weakness on RLE due to post op status and decreased mobility   Special Tests    Special Tests Swelling Tests   Swelling Tests other   other   Comments superior pole of patella: R 48.7 cm; L 44.5 cm   Ambulation/Gait   Ambulation/Gait Yes   Ambulation/Gait Assistance 6: Modified independent (Device/Increase time)   Ambulation Distance (Feet) 75 Feet  x 2   Assistive device Rolling walker   Gait Pattern Step-to pattern;Antalgic;Decreased hip/knee flexion - right   Ambulation  Surface Level;Indoor   Gait velocity decreased                            PT Short Term Goals - 03/28/14 1341    PT SHORT TERM GOAL #1   Title independent with HEP (04/25/14)   Time 4   Period Weeks   Status New   PT SHORT TERM GOAL #2   Title improve R knee active ROM 5-75 for improved mobility (04/25/14)   Time 4   Period Weeks   Status New   PT SHORT TERM GOAL #3   Title ambulate > 150' with single point cane modified independent for improved mobility (04/25/14)   Time 4   Period Weeks   Status  New   PT SHORT TERM GOAL #4   Title report pain < 3/10 for improved function and decreased pain (04/25/14)   Time 4   Period Weeks   Status New           PT Long Term Goals - 03/28/14 1343    PT LONG TERM GOAL #1   Title independent with advanced HEP (05/23/14)   Time 8   Period Weeks   Status New   PT LONG TERM GOAL #2   Title improve R knee active ROM 0-110 for improved function and mobility (05/23/14)   Time 8   Period Weeks   Status New   PT LONG TERM GOAL #3   Title report ability to ambulate > 30 min without increase in pain without device (05/23/14)   Time 8   Period Weeks   Status New   PT LONG TERM GOAL #4   Title RLE circumferential measurement within 0.5 cm of LLE for decreased edema and improved function (05/23/14)   Time 8   Period Weeks   Status New               Plan - 03/28/14 1338    Clinical Impression Statement Pt presents to OPPT with significant functional limitations due to decreased ROM, balance, strength and mobility from  recent R uni knee arthroplasty.  Pt will benefit from PT to maximize functional mobility.   Pt will benefit from skilled therapeutic intervention in order to improve on the following deficits Abnormal gait;Decreased balance;Difficulty walking;Decreased range of motion;Decreased strength;Decreased activity tolerance;Decreased mobility;Pain;Impaired flexibility;Increased edema;Decreased knowledge of use of DME    Rehab Potential Good   PT Frequency 3x / week   PT Duration 8 weeks  may decrease to 2x/week after 4 weeks depending on progress   PT Treatment/Interventions ADLs/Self Care Home Management;Cryotherapy;Clinical cytogeneticist;Therapeutic exercise;Patient/family education;Balance training;Stair training;Moist Heat;Contrast Bath;Functional mobility training;Neuromuscular re-education;Passive range of motion;Manual techniques;Therapeutic activities;DME Instruction;Ultrasound   PT Next Visit Plan mobs for ROM; passive ROM; progress HEP as tolerated; follow protocol   PT Home Exercise Plan continue HEP from HHPT; will progress as tolerated   Consulted and Agree with Plan of Care Patient         Problem List Patient Active Problem List   Diagnosis Date Noted  . Right knee pain 08/23/2013  . Left knee pain 06/02/2013  . Migraine 03/16/2012  . Obesity (BMI 30.0-34.9) 11/25/2011  . Allergic rhinitis 11/25/2011  . Primary localized osteoarthritis of right knee 11/25/2011  . History of viral pericarditis 11/25/2011   Clarita Crane, PT, DPT 03/28/2014 1:49 PM  [facility] [address] [place], [place], [address] Phone: [phone]   Fax:  [phone]

## 2014-03-28 NOTE — Telephone Encounter (Signed)
appts make and printed...td

## 2014-03-30 ENCOUNTER — Ambulatory Visit: Payer: PRIVATE HEALTH INSURANCE | Attending: Internal Medicine | Admitting: Physical Therapy

## 2014-03-30 ENCOUNTER — Telehealth: Payer: Self-pay | Admitting: *Deleted

## 2014-03-30 DIAGNOSIS — R269 Unspecified abnormalities of gait and mobility: Secondary | ICD-10-CM

## 2014-03-30 DIAGNOSIS — Z96651 Presence of right artificial knee joint: Secondary | ICD-10-CM

## 2014-03-30 DIAGNOSIS — M25661 Stiffness of right knee, not elsewhere classified: Secondary | ICD-10-CM

## 2014-03-30 DIAGNOSIS — M6281 Muscle weakness (generalized): Secondary | ICD-10-CM | POA: Diagnosis not present

## 2014-03-30 NOTE — Patient Instructions (Signed)
Instructed in AA prone hamstring curls; seated AA knee flexion.  Educated in elevation and ice for edema management.

## 2014-03-30 NOTE — Therapy (Signed)
[facility] [address] [place], [place], [address] Phone: [phone]   Fax:  [phone]  Physical Therapy Treatment  Patient Details  Name: Dawn SchmidConstance M [name] MRN: [id number] Date of Birth: 04/23/1965 Referring Provider:  Katy [name], Dawn D, MD  Encounter Date: 03/30/2014      PT End of Session - 03/30/14 0836    Visit Number 2   Number of Visits 24   Date for PT Re-Evaluation 05/27/14   PT Start Time 0800   PT Stop Time 0846   PT Time Calculation (min) 46 min   Activity Tolerance Patient tolerated treatment well;Patient limited by pain   Behavior During Therapy [facility] for tasks assessed/performed      Past Medical History  Diagnosis Date  . Acute meniscal tear of knee LEFT  . Seasonal allergies   . Environmental allergies   . History of gastric ulcer AS TEEN  . History of viral pericarditis PROBABLE OR IDIOPATHIC PER [name]/C SUMMARY MARCH 2011    NO PROBLEMS SINCE  . Arthritis   . Asthma   . Contact lens/glasses fitting     wears contacts or glasses  . Primary localized osteoarthritis of right knee 11/25/2011    S/P Left Knee arthroscopy performed by Dr. Charlann [name] in April 2013.     Past Surgical History  Procedure Laterality Date  . Vaginal hysterectomy  03-18-1999  . Cholecystectomy  1992  . Appendectomy  1998  . Bilateral carpal tunnel release  1980'S  . Transthoracic echocardiogram  04-26-2009    NORMAL LVSF/ EF 60-65% / LVDF NORMAL / NO PERICARDIAL EFFUSION  . Knee arthroscopy  06/20/2011    Procedure: ARTHROSCOPY KNEE;  Surgeon: Dawn PalMatthew [name] Olin, MD;  [facility] [place];  Service: Orthopedics;  Laterality: Left;  left knee scope   latex allergy patient blisters and swells  . Breast reduction surgery Bilateral 12/21/2012    Procedure: BILATERAL MAMMARY REDUCTION  (BREAST);  Surgeon: Dawn Sjogrenavid Bowers, MD;  Location: [facility];  Service: Plastics;  Laterality: Bilateral;  . Partial knee  arthroplasty Right 03/03/2014    Procedure: RIGHT UNICOMPARTMENTAL KNEE ARTHROPLASTY;  Surgeon: Dawn PostJoshua P Landau, MD;  Location: [facility];  Service: Orthopedics;  Laterality: Right;    There were no vitals taken for this visit.  Visit Diagnosis:  S/P right unicompartmental knee replacement  Knee stiffness, right  Abnormality of gait  Muscle weakness of lower extremity      Subjective Assessment - 03/30/14 0804    Symptoms R knee pain today   Pertinent History asthma   Limitations Sitting;Standing;Walking;House hold activities   How long can you sit comfortably? 5 min   How long can you stand comfortably? 20-30 min   How long can you walk comfortably? > 10 min   Patient Stated Goals sit comfortably; improve motion and decrease pain   Currently in Pain? Yes   Pain Score 6    Pain Location Knee   Pain Orientation Right   Pain Descriptors / Indicators Aching   Pain Type Surgical pain   Pain Onset 1 to 4 weeks ago   Pain Frequency Constant   Aggravating Factors  sitting/bending   Pain Relieving Factors meds                    [facility] Adult PT Treatment/Exercise - 03/30/14 0806    Knee/Hip Exercises: Aerobic   Stationary Bike partial revolutions x 10 min for R knee ROM   Knee/Hip  Exercises: Seated   Heel Slides AAROM;Right;5 reps  with scoot forward for ROM   Knee/Hip Exercises: Prone   Hamstring Curl 5 seconds;10 reps  with strap for ROM   Modalities   Modalities Cryotherapy   Cryotherapy   Number Minutes Cryotherapy 15 Minutes   Cryotherapy Location Knee   Type of Cryotherapy Other (comment)  vasopneumatic mod pressure   Manual Therapy   Manual Therapy Passive ROM;Edema management   Passive ROM prone R knee flexion; contract relax for ROM                PT Education - 03/30/14 0835    Education provided Yes   Education Details AA ROM; elevation and ice for edema management   Person(s) Educated Patient   Methods Explanation           PT Short Term Goals - 03/30/14 1610    PT SHORT TERM GOAL #1   Title independent with HEP (04/25/14)   Status On-going   PT SHORT TERM GOAL #2   Title improve R knee active ROM 5-75 for improved mobility (04/25/14)   Status On-going   PT SHORT TERM GOAL #3   Title ambulate > 150' with single point cane modified independent for improved mobility (04/25/14)   Status On-going   PT SHORT TERM GOAL #4   Title report pain < 3/10 for improved function and decreased pain (04/25/14)   Status On-going           PT Long Term Goals - 03/30/14 0837    PT LONG TERM GOAL #1   Title independent with advanced HEP (05/23/14)   Status On-going   PT LONG TERM GOAL #2   Title improve R knee active ROM 0-110 for improved function and mobility (05/23/14)   Status On-going   PT LONG TERM GOAL #3   Title report ability to ambulate > 30 min without increase in pain without device (05/23/14)   Status On-going   PT LONG TERM GOAL #4   Title RLE circumferential measurement within 0.5 cm of LLE for decreased edema and improved function (05/23/14)   Status On-going               Plan - 03/30/14 0836    PT Home Exercise Plan aggressive ROM; edema management   Consulted and Agree with Plan of Care Patient        Problem List Patient Active Problem List   Diagnosis Date Noted  . Right knee pain 08/23/2013  . Left knee pain 06/02/2013  . Migraine 03/16/2012  . Obesity (BMI 30.0-34.9) 11/25/2011  . Allergic rhinitis 11/25/2011  . Primary localized osteoarthritis of right knee 11/25/2011  . History of viral pericarditis 11/25/2011   Clarita Crane, PT, DPT 03/30/2014 9:36 AM  [facility] [address] [place], [place], [address] Phone: [phone]   Fax:  [phone]

## 2014-03-30 NOTE — Telephone Encounter (Signed)
Pt requested for you to give her a call...thanks

## 2014-04-04 ENCOUNTER — Ambulatory Visit: Payer: PRIVATE HEALTH INSURANCE | Admitting: Physical Therapy

## 2014-04-04 DIAGNOSIS — R269 Unspecified abnormalities of gait and mobility: Secondary | ICD-10-CM

## 2014-04-04 DIAGNOSIS — Z96651 Presence of right artificial knee joint: Secondary | ICD-10-CM | POA: Diagnosis not present

## 2014-04-04 DIAGNOSIS — M25661 Stiffness of right knee, not elsewhere classified: Secondary | ICD-10-CM

## 2014-04-04 DIAGNOSIS — M6281 Muscle weakness (generalized): Secondary | ICD-10-CM

## 2014-04-04 NOTE — Therapy (Signed)
[facility] [address] [place], [place], [address] Phone: [phone]   Fax:  [phone]  Physical Therapy Treatment  Patient Details  Name: Barron SchmidConstance M Foote MRN: [id number] Date of Birth: 10/20/1965 Referring Provider:  Katy ApoPolite, Ronald D, MD  Encounter Date: 04/04/2014      PT End of Session - 04/04/14 1149    Visit Number 3   Number of Visits 24   Date for PT Re-Evaluation 05/27/14   PT Start Time 1015   PT Stop Time 1110   PT Time Calculation (min) 55 min   Activity Tolerance Patient tolerated treatment well;Patient limited by pain   Behavior During Therapy [facility] for tasks assessed/performed      Past Medical History  Diagnosis Date  . Acute meniscal tear of knee LEFT  . Seasonal allergies   . Environmental allergies   . History of gastric ulcer AS TEEN  . 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 Arthritis   . Asthma   . Contact lens/glasses fitting     wears contacts or glasses  . Primary localized osteoarthritis of right knee 11/25/2011    S/P Left Knee arthroscopy performed by Dr. Charlann Boxerlin in April 2013.     Past Surgical History  Procedure Laterality Date  . Vaginal hysterectomy  03-18-1999  . Cholecystectomy  1992  . Appendectomy  1998  . Bilateral carpal tunnel release  1980'S  . Transthoracic echocardiogram  04-26-2009    NORMAL LVSF/ EF 60-65% / LVDF NORMAL / NO PERICARDIAL EFFUSION  . Knee arthroscopy  06/20/2011    Procedure: ARTHROSCOPY KNEE;  Surgeon: Shelda PalMatthew D Olin, MD;  [facility] [place];  Service: Orthopedics;  Laterality: Left;  left knee scope   latex allergy patient blisters and swells  . Breast reduction surgery Bilateral 12/21/2012    Procedure: BILATERAL MAMMARY REDUCTION  (BREAST);  Surgeon: Etter Sjogrenavid Bowers, MD;  Location: [facility];  Service: Plastics;  Laterality: Bilateral;  . Partial knee  arthroplasty Right 03/03/2014    Procedure: RIGHT UNICOMPARTMENTAL KNEE ARTHROPLASTY;  Surgeon: Eulas PostJoshua P Landau, MD;  Location: [facility];  Service: Orthopedics;  Laterality: Right;    There were no vitals taken for this visit.  Visit Diagnosis:  S/P right unicompartmental knee replacement  Knee stiffness, right  Abnormality of gait  Muscle weakness of lower extremity      Subjective Assessment - 04/04/14 1021    Symptoms R knee stiff; slept better last night   Pertinent History asthma   Limitations Sitting;Standing;Walking;House hold activities   How long can you sit comfortably? 5 min   How long can you stand comfortably? 20-30 min   How long can you walk comfortably? > 10 min   Patient Stated Goals sit comfortably; improve motion and decrease pain   Currently in Pain? Yes   Pain Score 5    Pain Location Knee   Pain Orientation Right   Pain Descriptors / Indicators Aching   Pain Type Surgical pain   Pain Onset 1 to 4 weeks ago   Pain Frequency Constant   Aggravating Factors  sitting; bending   Pain Relieving Factors medication                    [facility] Adult PT Treatment/Exercise - 04/04/14 1022    Knee/Hip Exercises: Aerobic   Stationary Bike partial revolutions x 10 min for R knee  ROM   Knee/Hip Exercises: Supine   Quad Sets Right;15 reps  difficult to get quad contraction   Heel Slides AAROM;Right;15 reps   Straight Leg Raises 10 reps;Right                  PT Short Term Goals - 04/04/14 1151    PT SHORT TERM GOAL #1   Title independent with HEP (04/25/14)   Status On-going   PT SHORT TERM GOAL #2   Title improve R knee active ROM 5-75 for improved mobility (04/25/14)   Status On-going   PT SHORT TERM GOAL #3   Title ambulate > 150' with single point cane modified independent for improved mobility (04/25/14)   Status On-going   PT SHORT TERM GOAL #4   Title report pain < 3/10 for improved function and decreased pain (04/25/14)    Status On-going           PT Long Term Goals - 04/04/14 1151    PT LONG TERM GOAL #1   Title independent with advanced HEP (05/23/14)   Status On-going   PT LONG TERM GOAL #2   Title improve R knee active ROM 0-110 for improved function and mobility (05/23/14)   Status On-going   PT LONG TERM GOAL #3   Title report ability to ambulate > 30 min without increase in pain without device (05/23/14)   Status On-going   PT LONG TERM GOAL #4   Title RLE circumferential measurement within 0.5 cm of LLE for decreased edema and improved function (05/23/14)   Status On-going               Plan - 04/04/14 1150    Clinical Impression Statement Pt continues to demonstrate weak quad muscles and decreased ROM.  Will benefit from PT to maximize funciton.   PT Next Visit Plan mobs for ROM; passive ROM; progress HEP as tolerated; follow protocol   PT Home Exercise Plan aggressive ROM; edema management   Consulted and Agree with Plan of Care Patient        Problem List Patient Active Problem List   Diagnosis Date Noted  . Right knee pain 08/23/2013  . Left knee pain 06/02/2013  . Migraine 03/16/2012  . Obesity (BMI 30.0-34.9) 11/25/2011  . Allergic rhinitis 11/25/2011  . Primary localized osteoarthritis of right knee 11/25/2011  . History of viral pericarditis 11/25/2011   Clarita Crane, PT, DPT 04/04/2014 11:52 AM  [facility] [address] [place], [place], [address] Phone: [phone]   Fax:  [phone]

## 2014-04-06 ENCOUNTER — Telehealth: Payer: Self-pay | Admitting: *Deleted

## 2014-04-06 ENCOUNTER — Ambulatory Visit: Payer: PRIVATE HEALTH INSURANCE | Admitting: Physical Therapy

## 2014-04-06 DIAGNOSIS — M6281 Muscle weakness (generalized): Secondary | ICD-10-CM

## 2014-04-06 DIAGNOSIS — R269 Unspecified abnormalities of gait and mobility: Secondary | ICD-10-CM

## 2014-04-06 DIAGNOSIS — M25661 Stiffness of right knee, not elsewhere classified: Secondary | ICD-10-CM

## 2014-04-06 DIAGNOSIS — Z96651 Presence of right artificial knee joint: Secondary | ICD-10-CM | POA: Diagnosis not present

## 2014-04-06 NOTE — Therapy (Signed)
[facility] [address] [place], [place], [address] Phone: [phone]   Fax:  [phone]  Physical Therapy Treatment  Patient Details  Name: Dawn [name] MRN: [id number] Date of Birth: 02/22/1966 Referring Provider:  Katy ApoPolite, Ronald D, MD  Encounter Date: 04/06/2014      PT End of Session - 04/06/14 0934    Visit Number 4   Number of Visits 24   Date for PT Re-Evaluation 05/27/14   PT Start Time 0845   PT Stop Time 0943   PT Time Calculation (min) 58 min   Activity Tolerance Patient tolerated treatment well;Patient limited by pain   Behavior During Therapy [facility] for tasks assessed/performed      Past Medical History  Diagnosis Date  . Acute meniscal tear of knee LEFT  . Seasonal allergies   . Environmental allergies   . History of gastric ulcer AS TEEN  . 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 Arthritis   . Asthma   . Contact lens/glasses fitting     wears contacts or glasses  . Primary localized osteoarthritis of right knee 11/25/2011    S/P Left Knee arthroscopy performed by Dr. Charlann Boxerlin in April 2013.     Past Surgical History  Procedure Laterality Date  . Vaginal hysterectomy  03-18-1999  . Cholecystectomy  1992  . Appendectomy  1998  . Bilateral carpal tunnel release  1980'S  . Transthoracic echocardiogram  04-26-2009    NORMAL LVSF/ EF 60-65% / LVDF NORMAL / NO PERICARDIAL EFFUSION  . Knee arthroscopy  06/20/2011    Procedure: ARTHROSCOPY KNEE;  Surgeon: Shelda PalMatthew D Olin, MD;  [facility];  Service: Orthopedics;  Laterality: Left;  left knee scope   latex allergy patient blisters and swells  . Breast reduction surgery Bilateral 12/21/2012    Procedure: BILATERAL MAMMARY REDUCTION  (BREAST);  Surgeon: Etter Sjogrenavid Bowers, MD;  Location: [facility];  Service: Plastics;  Laterality: Bilateral;  . Partial knee  arthroplasty Right 03/03/2014    Procedure: RIGHT UNICOMPARTMENTAL KNEE ARTHROPLASTY;  Surgeon: Eulas PostJoshua P Landau, MD;  Location: [facility];  Service: Orthopedics;  Laterality: Right;    There were no vitals taken for this visit.  Visit Diagnosis:  S/P right unicompartmental knee replacement  Knee stiffness, right  Abnormality of gait  Muscle weakness of lower extremity      Subjective Assessment - 04/06/14 0854    Symptoms bought seated pedal bike at home and able to make full revolution   Pertinent History asthma   Limitations Sitting;Standing;Walking;House hold activities   How long can you sit comfortably? 5 min   How long can you stand comfortably? 20-30 min   How long can you walk comfortably? > 10 min   Patient Stated Goals sit comfortably; improve motion and decrease pain   Currently in Pain? No/denies  just tight   Pain Location Knee   Pain Orientation Right   Pain Descriptors / Indicators Aching   Pain Type Surgical pain   Pain Onset 1 to 4 weeks ago   Pain Frequency Constant          [facility] PT Assessment - 04/06/14 0001    AROM   Right Knee Flexion 55                  [facility] Adult PT Treatment/Exercise - 04/06/14 0856    Knee/Hip Exercises: Aerobic  Stationary Bike partial revolutions x 10 min for R knee ROM   Modalities   Modalities Cryotherapy   Cryotherapy   Number Minutes Cryotherapy 15 Minutes   Cryotherapy Location Knee   Type of Cryotherapy Other (comment)  vasopneumatic mod pressure   Manual Therapy   Manual Therapy Passive ROM;Edema management   Edema Management manual lymphatic drainage to activate lymph system R knee   Passive ROM seated R knee flexion with ant tibia glides with flexion                  PT Short Term Goals - 04/06/14 0936    PT SHORT TERM GOAL #1   Title independent with HEP (04/25/14)   Status On-going   PT SHORT TERM GOAL #2   Title improve R knee active ROM 5-75 for improved mobility  (04/25/14)   Status On-going   PT SHORT TERM GOAL #3   Title ambulate > 150' with single point cane modified independent for improved mobility (04/25/14)   Status On-going   PT SHORT TERM GOAL #4   Title report pain < 3/10 for improved function and decreased pain (04/25/14)   Status On-going           PT Long Term Goals - 04/06/14 0936    PT LONG TERM GOAL #1   Title independent with advanced HEP (05/23/14)   Status On-going   PT LONG TERM GOAL #2   Title improve R knee active ROM 0-110 for improved function and mobility (05/23/14)   Status On-going   PT LONG TERM GOAL #3   Title report ability to ambulate > 30 min without increase in pain without device (05/23/14)   Status On-going   PT LONG TERM GOAL #4   Title RLE circumferential measurement within 0.5 cm of LLE for decreased edema and improved function (05/23/14)   Status On-going               Plan - 04/06/14 0935    Clinical Impression Statement Pt with improved PROM however active ROM unchanged for R knee.  Will continue to benefit from PT to maximize function.  Will focus on aggressive ROM with strengthening.   PT Next Visit Plan mobs for ROM; passive ROM; progress HEP as tolerated; follow protocol   PT Home Exercise Plan aggressive ROM; edema management        Problem List Patient Active Problem List   Diagnosis Date Noted  . Right knee pain 08/23/2013  . Left knee pain 06/02/2013  . Migraine 03/16/2012  . Obesity (BMI 30.0-34.9) 11/25/2011  . Allergic rhinitis 11/25/2011  . Primary localized osteoarthritis of right knee 11/25/2011  . History of viral pericarditis 11/25/2011   Clarita Crane, PT, DPT 04/06/2014 10:09 AM  [facility] [address] [place], [place], [address] Phone: [phone]   Fax:  [phone]

## 2014-04-06 NOTE — Telephone Encounter (Signed)
appts made and printed...td 

## 2014-04-07 ENCOUNTER — Ambulatory Visit: Payer: PRIVATE HEALTH INSURANCE | Admitting: Physical Therapy

## 2014-04-07 DIAGNOSIS — Z96651 Presence of right artificial knee joint: Secondary | ICD-10-CM | POA: Diagnosis not present

## 2014-04-07 DIAGNOSIS — R269 Unspecified abnormalities of gait and mobility: Secondary | ICD-10-CM

## 2014-04-07 DIAGNOSIS — M6281 Muscle weakness (generalized): Secondary | ICD-10-CM

## 2014-04-07 DIAGNOSIS — M25661 Stiffness of right knee, not elsewhere classified: Secondary | ICD-10-CM

## 2014-04-07 NOTE — Patient Instructions (Signed)
Patient instructed in knee flexion ROM exercises 3-5 min every 2 hours.

## 2014-04-07 NOTE — Therapy (Signed)
[facility] [address] [place], [place], [address] Phone: [phone]   Fax:  [phone]  Physical Therapy Treatment  Patient Details  Name: Dawn SchmidConstance M [name] MRN: [id number] Date of Birth: 09/05/1965 Referring Provider:  Katy ApoPolite, Dawn D, MD  Encounter Date: 04/07/2014      PT End of Session - 04/07/14 1251    Visit Number 5   Number of Visits 24   Date for PT Re-Evaluation 05/27/14   PT Start Time 0936   PT Stop Time 1043   PT Time Calculation (min) 67 min   Activity Tolerance Patient tolerated treatment well;Patient limited by pain      Past Medical History  Diagnosis Date  . Acute meniscal tear of knee LEFT  . Seasonal allergies   . Environmental allergies   . History of gastric ulcer AS TEEN  . History of viral pericarditis PROBABLE OR IDIOPATHIC PER [name]/C SUMMARY MARCH 2011    NO PROBLEMS SINCE  . Arthritis   . Asthma   . Contact lens/glasses fitting     wears contacts or glasses  . Primary localized osteoarthritis of right knee 11/25/2011    S/P Left Knee arthroscopy performed by Dr. Charlann Boxerlin in April 2013.     Past Surgical History  Procedure Laterality Date  . Vaginal hysterectomy  03-18-1999  . Cholecystectomy  1992  . Appendectomy  1998  . Bilateral carpal tunnel release  1980'S  . Transthoracic echocardiogram  04-26-2009    NORMAL LVSF/ EF 60-65% / LVDF NORMAL / NO PERICARDIAL EFFUSION  . Knee arthroscopy  06/20/2011    Procedure: ARTHROSCOPY KNEE;  Surgeon: Dawn PalMatthew [name] Olin, MD;  [facility] [place];  Service: Orthopedics;  Laterality: Left;  left knee scope   latex allergy patient blisters and swells  . Breast reduction surgery Bilateral 12/21/2012    Procedure: BILATERAL MAMMARY REDUCTION  (BREAST);  Surgeon: Dawn Sjogrenavid Bowers, MD;  Location: [facility];  Service: Plastics;  Laterality: Bilateral;  . Partial knee arthroplasty Right 03/03/2014    Procedure: RIGHT  UNICOMPARTMENTAL KNEE ARTHROPLASTY;  Surgeon: Dawn PostJoshua P Landau, MD;  Location: [facility];  Service: Orthopedics;  Laterality: Right;    There were no vitals taken for this visit.  Visit Diagnosis:  S/P right unicompartmental knee replacement  Knee stiffness, right  Abnormality of gait  Muscle weakness of lower extremity      Subjective Assessment - 04/07/14 1001    Symptoms The doctor realized I wasn't on a muscle relaxer and started me on one and added an anti-inflammatory.  Also took a pain pill before PT today which she has not been doing.  States she slept much better last night.  [facility].    Patient Stated Goals sit comfortably; improve motion and decrease pain   Currently in Pain? Yes   Pain Score 3    Pain Location Knee   Pain Orientation Right   Pain Type Surgical pain   Pain Onset More than a month ago   Aggravating Factors  sleeping, bending knee          [facility] PT Assessment - 04/07/14 1251    AROM   Right Knee Flexion 74  active-assisted                  [facility] Adult PT Treatment/Exercise - 04/07/14 1104    Exercises   Exercises Knee/Hip   Knee/Hip Exercises: Aerobic   Stationary Bike Nu-Step L2 5 min  Knee/Hip Exercises: Standing   Other Standing Knee Exercises Sit to stand for hi table no UEs 8x   Other Standing Knee Exercises Church pew sways and reaching up wall with glut squeeze to activate quads 2 minutes   Knee/Hip Exercises: Seated   Long Arc Quad Right;20 reps   Long Arc Quad Weight 3 lbs.   Other Seated Knee Exercises --  Red HS pullbacks 20x   Knee/Hip Exercises: Supine   Quad Sets Right;15 reps   Other Supine Knee Exercises LEs on ball for flexion 20x   Other Supine Knee Exercises HS sets on green ball 10x   Cryotherapy   Number Minutes Cryotherapy 15 Minutes   Cryotherapy Location Knee   Type of Cryotherapy Ice pack   Manual Therapy   Manual Therapy Joint mobilization;Myofascial release;Passive  ROM;Other (comment)   Joint Mobilization Tibial knee flexion mobs grade 3 10x   Myofascial Release Quad and HS contract relax 5 x each   Passive ROM Seated and supine knee flexion 20x   Other Manual Therapy patellar mobs med/lat; sup/inf 10x each                PT Education - 04/07/14 1250    Education provided Yes   Education Details Knee flexion ROM exercise 3-5 min every 2 hours   Person(s) Educated Patient   Methods Explanation;Demonstration;Handout   Comprehension Verbalized understanding;Returned demonstration          PT Short Term Goals - 04/07/14 1257    PT SHORT TERM GOAL #1   Title independent with HEP (04/25/14)   Time 4   Period Weeks   Status On-going   PT SHORT TERM GOAL #2   Title improve R knee active ROM 5-75 for improved mobility (04/25/14)   Time 4   Period Weeks   Status On-going   PT SHORT TERM GOAL #3   Title ambulate > 150' with single point cane modified independent for improved mobility (04/25/14)   Status Achieved   PT SHORT TERM GOAL #4   Title report pain < 3/10 for improved function and decreased pain (04/25/14)   Time 4   Period Weeks   Status On-going           PT Long Term Goals - 04/07/14 1257    PT LONG TERM GOAL #1   Title independent with advanced HEP (05/23/14)   Time 8   Period Weeks   Status On-going   PT LONG TERM GOAL #2   Title improve R knee active ROM 0-110 for improved function and mobility (05/23/14)   Time 8   Period Weeks   Status On-going   PT LONG TERM GOAL #3   Title report ability to ambulate > 30 min without increase in pain without device (05/23/14)   Time 8   Period Weeks   Status On-going   PT LONG TERM GOAL #4   Title RLE circumferential measurement within 0.5 cm of LLE for decreased edema and improved function (05/23/14)   Time 8   Period Weeks   Status On-going               Plan - 04/07/14 1252    Clinical Impression Statement Patient improved knee AAROM flexion to 74 degrees but still  quite limited.  Decreased activation of quads with moderate quad lag persisting.  Using SPC instead of walker with stiff knee gait.  Patient does seem to tolerate ROM better painwise with medication change.  PT Next Visit Plan mobs for ROM; passive ROM for flexion and extension; quad activation exercises; cryotherapy; gait cues         Problem List Patient Active Problem List   Diagnosis Date Noted  . Right knee pain 08/23/2013  . Left knee pain 06/02/2013  . Migraine 03/16/2012  . Obesity (BMI 30.0-34.9) 11/25/2011  . Allergic rhinitis 11/25/2011  . Primary localized osteoarthritis of right knee 11/25/2011  . History of viral pericarditis 11/25/2011    Vivien Presto 04/07/2014, 1:01 PM  [facility] [address] [place], [place], [address] Phone: [phone]   Fax:  [phone]  Lavinia Sharps, PT 04/07/2014 1:02 PM Phone: [phone] Fax: [phone]

## 2014-04-10 ENCOUNTER — Ambulatory Visit: Payer: PRIVATE HEALTH INSURANCE

## 2014-04-13 ENCOUNTER — Ambulatory Visit: Payer: PRIVATE HEALTH INSURANCE | Admitting: Physical Therapy

## 2014-04-13 DIAGNOSIS — Z96651 Presence of right artificial knee joint: Secondary | ICD-10-CM | POA: Diagnosis not present

## 2014-04-13 DIAGNOSIS — M6281 Muscle weakness (generalized): Secondary | ICD-10-CM

## 2014-04-13 DIAGNOSIS — R269 Unspecified abnormalities of gait and mobility: Secondary | ICD-10-CM

## 2014-04-13 DIAGNOSIS — M25661 Stiffness of right knee, not elsewhere classified: Secondary | ICD-10-CM

## 2014-04-13 NOTE — Therapy (Signed)
[place], [place], [address] Phone: [phone]   Fax:  [phone]  Physical Therapy Treatment  Patient Details  Name: Dawn [name] MRN: [id number] Date of Birth: 1965/06/06 Referring Provider:  Kandice Hams, MD  Encounter Date: 04/13/2014      PT End of Session - 04/13/14 1404    Visit Number 6   Number of Visits 24   Date for PT Re-Evaluation 05/27/14   PT Start Time 1055   PT Stop Time 1200   PT Time Calculation (min) 65 min   Activity Tolerance Patient limited by pain      Past Medical History  Diagnosis Date  . Acute meniscal tear of knee LEFT  . Seasonal allergies   . Environmental allergies   . History of gastric ulcer AS TEEN  . 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 Arthritis   . Asthma   . Contact lens/glasses fitting     wears contacts or glasses  . Primary localized osteoarthritis of right knee 11/25/2011    S/P Left Knee arthroscopy performed by Dr. Alvan Dame in April 2013.     Past Surgical History  Procedure Laterality Date  . Vaginal hysterectomy  03-18-1999  . Cholecystectomy  1992  . Appendectomy  1998  . Bilateral carpal tunnel release  1980'S  . Transthoracic echocardiogram  04-26-2009    NORMAL LVSF/ EF 60-65% / LVDF NORMAL / NO PERICARDIAL EFFUSION  . Knee arthroscopy  06/20/2011    Procedure: ARTHROSCOPY KNEE;  Surgeon: Mauri Pole, MD;  [facility];  Service: Orthopedics;  Laterality: Left;  left knee scope   latex allergy patient blisters and swells  . Breast reduction surgery Bilateral 12/21/2012    Procedure: BILATERAL MAMMARY REDUCTION  (BREAST);  Surgeon: Crissie Reese, MD;  Location: [place];  Service: Plastics;  Laterality: Bilateral;  . Partial knee arthroplasty Right 03/03/2014    Procedure: RIGHT UNICOMPARTMENTAL KNEE ARTHROPLASTY;  Surgeon:  Johnny Bridge, MD;  Location: [place];  Service: Orthopedics;  Laterality: Right;    There were no vitals taken for this visit.  Visit Diagnosis:  S/P right unicompartmental knee replacement  Knee stiffness, right  Abnormality of gait  Muscle weakness of lower extremity      Subjective Assessment - 04/13/14 1356    Symptoms The patient states she overdid it working on her own on Monday and increased the CPM too much (to 99 degrees) which set her back until Tuesday.  Reports good compliance with HEP but expresses frustration with her continued knee stiffness.  Patient continues to use SPC.  Unable to drive yet.    Currently in Pain? Yes   Pain Score 3    Pain Location Knee   Pain Orientation Right   Pain Onset More than a month ago   Aggravating Factors  bending her knee   Pain Relieving Factors medication                    [facility] Adult PT Treatment/Exercise - 04/13/14 1400    Lumbar Exercises: Aerobic   Stationary Bike Nu-Step L5 10 min   Knee/Hip Exercises: Standing   SLS with Vectors 3 ways WB on right 12x   Other Standing Knee Exercises Prostretch 3x 20 sec hold   Knee/Hip Exercises: Seated   Long Arc [employer] reps  Long Arc Quad Weight 3 lbs.   Other Seated Knee Exercises HS pullbacks red band 20x   Knee/Hip Exercises: Supine   Other Supine Knee Exercises LEs on ball for flexion 20x   Other Supine Knee Exercises HS sets on green ball 10x   Cryotherapy   Number Minutes Cryotherapy 15 Minutes   Cryotherapy Location Knee   Type of Cryotherapy Ice pack   Manual Therapy   Manual Therapy Joint mobilization;Myofascial release;Passive ROM;Other (comment)   Joint Mobilization Seated and supine tibial glides for flexion; patellar glides med/lat/sup/inf 10x each; grade 2/3 knee extension mobs in supine 15x   Myofascial Release Quads and HS   Passive ROM Seated knee flexion 20x;    Other Manual Therapy HS stretch 3x 20 sec                   PT Short Term Goals - 04/13/14 1410    PT SHORT TERM GOAL #1   Title independent with HEP (04/25/14)   Time 4   Period Weeks   Status Achieved   PT SHORT TERM GOAL #2   Title improve R knee active ROM 5-75 for improved mobility (04/25/14)   Time 4   Period Weeks   Status Partially Met   PT SHORT TERM GOAL #3   Title ambulate > 150' with single point cane modified independent for improved mobility (04/25/14)   Status Achieved   PT SHORT TERM GOAL #4   Title report pain < 3/10 for improved function and decreased pain (04/25/14)   Time 4   Period Weeks   Status On-going           PT Long Term Goals - 04/13/14 1410    PT LONG TERM GOAL #1   Title independent with advanced HEP (05/23/14)   Time 8   Period Weeks   Status On-going   PT LONG TERM GOAL #2   Title improve R knee active ROM 0-110 for improved function and mobility (05/23/14)   Time 8   Period Weeks   Status On-going   PT LONG TERM GOAL #3   Title report ability to ambulate > 30 min without increase in pain without device (05/23/14)   Time 8   Period Weeks   Status On-going   PT LONG TERM GOAL #4   Title RLE circumferential measurement within 0.5 cm of LLE for decreased edema and improved function (05/23/14)   Time 8   Period Weeks   Status On-going               Plan - 04/13/14 1404    Clinical Impression Statement Pain and stiffness persist limiting progress toward goals.  Quad motor control still limited as well.  Moderate knee swelling.  After manual therapy AAROM 6-80 degrees.  Patient states the doctor mentioned a knee manipulation if her ROM did not improved significantly.  Patient was to avoid if at all possible.  Discussed a JAS dynamic splint or ERMI Flexionator for home stretching.   Patient states she is planning to text the doctor for his input on this.     PT Next Visit Plan mobs for ROM; passive ROM for flexion and extension; quad activation exercises; cryotherapy; gait  cues         Problem List Patient Active Problem List   Diagnosis Date Noted  . Right knee pain 08/23/2013  . Left knee pain 06/02/2013  . Migraine 03/16/2012  . Obesity (BMI 30.0-34.9) 11/25/2011  . Allergic  rhinitis 11/25/2011  . Primary localized osteoarthritis of right knee 11/25/2011  . History of viral pericarditis 11/25/2011    Alvera Singh 04/13/2014, 2:15 PM  [facility] [address] [place], [place], [address] Phone: [phone]   Fax:  [phone]   Ruben Im, PT 04/13/2014 2:15 PM Phone: [phone] Fax: [phone]

## 2014-04-14 ENCOUNTER — Ambulatory Visit: Payer: PRIVATE HEALTH INSURANCE | Admitting: Physical Therapy

## 2014-04-14 DIAGNOSIS — R269 Unspecified abnormalities of gait and mobility: Secondary | ICD-10-CM

## 2014-04-14 DIAGNOSIS — Z96651 Presence of right artificial knee joint: Secondary | ICD-10-CM

## 2014-04-14 DIAGNOSIS — M6281 Muscle weakness (generalized): Secondary | ICD-10-CM

## 2014-04-14 DIAGNOSIS — M25661 Stiffness of right knee, not elsewhere classified: Secondary | ICD-10-CM

## 2014-04-14 NOTE — Therapy (Signed)
[place], [place], [address] Phone: [phone]   Fax:  [phone]  Physical Therapy Treatment  Patient Details  Name: Dawn [name] MRN: [id number] Date of Birth: 17-Dec-1965 Referring Provider:  Kandice Hams, MD  Encounter Date: 04/14/2014      PT End of Session - 04/14/14 1220    Visit Number 7   Number of Visits 24   Date for PT Re-Evaluation 05/27/14   PT Start Time 1100   PT Stop Time 1217   PT Time Calculation (min) 77 min   Activity Tolerance Patient limited by pain      Past Medical History  Diagnosis Date  . Acute meniscal tear of knee LEFT  . Seasonal allergies   . Environmental allergies   . History of gastric ulcer AS TEEN  . 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 Arthritis   . Asthma   . Contact lens/glasses fitting     wears contacts or glasses  . Primary localized osteoarthritis of right knee 11/25/2011    S/P Left Knee arthroscopy performed by Dr. Alvan Dame in April 2013.     Past Surgical History  Procedure Laterality Date  . Vaginal hysterectomy  03-18-1999  . Cholecystectomy  1992  . Appendectomy  1998  . Bilateral carpal tunnel release  1980'S  . Transthoracic echocardiogram  04-26-2009    NORMAL LVSF/ EF 60-65% / LVDF NORMAL / NO PERICARDIAL EFFUSION  . Knee arthroscopy  06/20/2011    Procedure: ARTHROSCOPY KNEE;  Surgeon: Mauri Pole, MD;  Location: [facility];  Service: Orthopedics;  Laterality: Left;  left knee scope   latex allergy patient blisters and swells  . Breast reduction surgery Bilateral 12/21/2012    Procedure: BILATERAL MAMMARY REDUCTION  (BREAST);  Surgeon: Crissie Reese, MD;  Location: [place];  Service: Plastics;  Laterality: Bilateral;  . Partial knee arthroplasty Right 03/03/2014    Procedure: RIGHT UNICOMPARTMENTAL KNEE ARTHROPLASTY;  Surgeon:  Johnny Bridge, MD;  Location: [place];  Service: Orthopedics;  Laterality: Right;    There were no vitals taken for this visit.  Visit Diagnosis:  S/P right unicompartmental knee replacement  Knee stiffness, right  Abnormality of gait  Muscle weakness of lower extremity      Subjective Assessment - 04/14/14 1112    Symptoms Patient states she had a lot of discomfort earlier this morning.  Continues to use SPC. Using ice regularly at home.   Currently in Pain? Yes   Pain Score 4    Pain Location Knee   Pain Orientation Right   Pain Type Surgical pain   Aggravating Factors  bending knee   Pain Relieving Factors medication                     [facility] Adult PT Treatment/Exercise - 04/14/14 1210    Lumbar Exercises: Aerobic   Stationary Bike Nu-Step L5 15 min   Knee/Hip Exercises: Standing   Lateral Step Up Right;15 reps;Hand Hold: 1;Step Height: 4"   Forward Step Up Right;15 reps;Step Height: 4";Hand Hold: 1   Step Down 15 reps;Hand Hold: 1;Step Height: 2"   Other Standing Knee Exercises Prostretch 3x 20 sec hold   Other Standing Knee Exercises --  SLS on right with left hip extension red band 12x   Knee/Hip Exercises: Seated   Long Arc [employer]  Right;20 reps   Long Arc Quad Weight 3 lbs.   Knee/Hip Exercises: Supine   Other Supine Knee Exercises LEs on ball for flexion 20x   Other Supine Knee Exercises HS sets on green ball 10x   Cryotherapy   Number Minutes Cryotherapy 15 Minutes   Cryotherapy Location Knee   Type of Cryotherapy Ice pack   Manual Therapy   Joint Mobilization Seated and supine tibial flexion mobs grade 2/3; patellar glides all directions; grade 3 knee extension mobs 10x   Myofascial Release Quad and HS soft tissue mobilization   Passive ROM Seated and supine knee flexion 15x each                PT Education - 04/14/14 1220    Education provided Yes   Education Details cold pack use   Person(s) Educated Patient    Methods Explanation   Comprehension Verbalized understanding          PT Short Term Goals - 04/14/14 1227    PT SHORT TERM GOAL #1   Title independent with HEP (04/25/14)   Status Achieved   PT SHORT TERM GOAL #2   Title improve R knee active ROM 5-75 for improved mobility (04/25/14)   Time 4   Period Weeks   Status Partially Met   PT SHORT TERM GOAL #3   Title ambulate > 150' with single point cane modified independent for improved mobility (04/25/14)   Status Achieved   PT SHORT TERM GOAL #4   Title report pain < 3/10 for improved function and decreased pain (04/25/14)   Time 4   Period Weeks   Status On-going           PT Long Term Goals - 04/14/14 1228    PT LONG TERM GOAL #1   Title independent with advanced HEP (05/23/14)   Time 8   Period Weeks   Status On-going   PT LONG TERM GOAL #2   Title improve R knee active ROM 0-110 for improved function and mobility (05/23/14)   Time 8   Period Weeks   Status On-going   PT LONG TERM GOAL #3   Title report ability to ambulate > 30 min without increase in pain without device (05/23/14)   Time 8   Period Weeks   Status On-going   PT LONG TERM GOAL #4   Title RLE circumferential measurement within 0.5 cm of LLE for decreased edema and improved function (05/23/14)   Time 8   Period Weeks   Status On-going               Plan - 04/14/14 1221    Clinical Impression Statement Pain and knee joint stiffness persist flexion >> extension.  AAROM flexion after extensive manual therapy 80 degrees;  Recommend dynamic splinting or ERMI flexionator for home use in hopes of avoiding joint manipulation procedure.   Will send request to MD with recommendation.     PT Next Visit Plan mobs for ROM; passive ROM for flexion and extension; quad activation exercises; cryotherapy; recommendation for [facility] Flexionator or dynamic splint        Problem List Patient Active Problem List   Diagnosis Date Noted  . Right knee pain 08/23/2013   . Left knee pain 06/02/2013  . Migraine 03/16/2012  . Obesity (BMI 30.0-34.9) 11/25/2011  . Allergic rhinitis 11/25/2011  . Primary localized osteoarthritis of right knee 11/25/2011  . History of viral pericarditis 11/25/2011      Recommend  ERMI Flexionator home stretching device or dynamic splinting.  Please send order if you agree.  Fax  [phone]   Alvera Singh 04/14/2014, 12:29 PM  [facility] [address] [place], [place], [address] Phone: [phone]   Fax:  [phone]   Ruben Im, [place] 04/14/2014 12:31 PM Phone: [phone] Fax: [phone]

## 2014-04-14 NOTE — Patient Instructions (Signed)
Patient requested info on where to purchase Elasto-Gel cold packs as used in the clinic for pain and edema control.

## 2014-04-17 ENCOUNTER — Other Ambulatory Visit: Payer: Self-pay | Admitting: Orthopedic Surgery

## 2014-04-17 DIAGNOSIS — M24661 Ankylosis, right knee: Secondary | ICD-10-CM

## 2014-04-18 ENCOUNTER — Ambulatory Visit: Payer: PRIVATE HEALTH INSURANCE | Admitting: Physical Therapy

## 2014-04-18 DIAGNOSIS — Z96651 Presence of right artificial knee joint: Secondary | ICD-10-CM

## 2014-04-18 DIAGNOSIS — R269 Unspecified abnormalities of gait and mobility: Secondary | ICD-10-CM

## 2014-04-18 DIAGNOSIS — M25661 Stiffness of right knee, not elsewhere classified: Secondary | ICD-10-CM

## 2014-04-18 DIAGNOSIS — M6281 Muscle weakness (generalized): Secondary | ICD-10-CM

## 2014-04-18 NOTE — Therapy (Signed)
[facility] [address] [place], [place], [address] Phone: [phone]   Fax:  [phone]  Physical Therapy Treatment  Patient Details  Name: Dawn [name] MRN: [id number] Date of Birth: 12/24/1965 Referring Provider:  Polite, Ronald D, MD  Encounter Date: 04/18/2014      PT End of Session - 04/18/14 0936    Visit Number 8   Number of Visits 24   Date for PT Re-Evaluation 05/27/14   PT Start Time 0928   PT Stop Time 1032   PT Time Calculation (min) 64 min      Past Medical History  Diagnosis Date  . Acute meniscal tear of knee LEFT  . Seasonal allergies   . Environmental allergies   . History of gastric ulcer AS TEEN  . 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 Arthritis   . Asthma   . Contact lens/glasses fitting     wears contacts or glasses  . Primary localized osteoarthritis of right knee 11/25/2011    S/P Left Knee arthroscopy performed by Dr. Olin in April 2013.     Past Surgical History  Procedure Laterality Date  . Vaginal hysterectomy  03-18-1999  . Cholecystectomy  1992  . Appendectomy  1998  . Bilateral carpal tunnel release  1980'S  . Transthoracic echocardiogram  04-26-2009    NORMAL LVSF/ EF 60-65% / LVDF NORMAL / NO PERICARDIAL EFFUSION  . Knee arthroscopy  06/20/2011    Procedure: ARTHROSCOPY KNEE;  Surgeon: Matthew D Olin, MD;  Location: [facility];  Service: Orthopedics;  Laterality: Left;  left knee scope   latex allergy patient blisters and swells  . Breast reduction surgery Bilateral 12/21/2012    Procedure: BILATERAL MAMMARY REDUCTION  (BREAST);  Surgeon: David Bowers, MD;  Location: [facility];  Service: Plastics;  Laterality: Bilateral;  . Partial knee arthroplasty Right 03/03/2014    Procedure: RIGHT UNICOMPARTMENTAL KNEE ARTHROPLASTY;  Surgeon: Joshua P Landau, MD;  Location: [facility];  Service: Orthopedics;  Laterality: Right;    There were no vitals taken for this visit.  Visit Diagnosis:  S/P right unicompartmental knee replacement  Knee stiffness, right  Abnormality of gait  Muscle weakness of lower extremity      Subjective Assessment - 04/18/14 0930    Symptoms pt reports feeling a little more sore today since the last visit. She stated she tried to go down her steps at home using her Left leg and reported she almost fell having to catch her self and felt increased pain.   Patient Stated Goals to be pain free   Currently in Pain? Yes   Pain Score 0-No pain   Pain Location --  She reports no pain just a tight feeling   Pain Orientation Right   Pain Descriptors / Indicators Tightness          [facility] PT Assessment - 04/18/14 0001    AROM   Right Knee Extension -10   Right Knee Flexion 56                  [facility] Adult PT Treatment/Exercise - 04/18/14 0001    Lumbar Exercises: Aerobic   Stationary Bike Nu-Step L5 8min   Knee/Hip Exercises: Standing   Forward Step Up Right;15 reps;Step Height: 4";Hand Hold: 1;Step Height: 6"   Forward Step Up Limitations times 15 reps 4" inch step; times   10 reps 6" step.   Knee/Hip Exercises: Supine   Quad Sets 10 reps;AROM;Strengthening;Right   Quad Sets Limitations 5 sec hold   Cryotherapy   Number Minutes Cryotherapy 20 Minutes   Cryotherapy Location Knee   Type of Cryotherapy Other (comment)  vasopnuematic mod pressure    Manual Therapy   Manual Therapy Joint mobilization;Myofascial release;Passive ROM;Other (comment)  10 minutes   Joint Mobilization supine grade 3 R knee mobs x 10 with flexion and extension, grade 2-3 patellar  mobs all directions x 10   Myofascial Release distal quad mobilization   Passive ROM supine knee flexion x 15                 PT Education - 04/18/14 1013    Education provided Yes   Education Details RICE for R Knee   Person(s) Educated Patient    Methods Explanation   Comprehension Verbalized understanding          PT Short Term Goals - 04/14/14 1227    PT SHORT TERM GOAL #1   Title independent with HEP (04/25/14)   Status Achieved   PT SHORT TERM GOAL #2   Title improve R knee active ROM 5-75 for improved mobility (04/25/14)   Time 4   Period Weeks   Status Partially Met   PT SHORT TERM GOAL #3   Title ambulate > 150' with single point cane modified independent for improved mobility (04/25/14)   Status Achieved   PT SHORT TERM GOAL #4   Title report pain < 3/10 for improved function and decreased pain (04/25/14)   Time 4   Period Weeks   Status On-going           PT Long Term Goals - 04/14/14 1228    PT LONG TERM GOAL #1   Title independent with advanced HEP (05/23/14)   Time 8   Period Weeks   Status On-going   PT LONG TERM GOAL #2   Title improve R knee active ROM 0-110 for improved function and mobility (05/23/14)   Time 8   Period Weeks   Status On-going   PT LONG TERM GOAL #3   Title report ability to ambulate > 30 min without increase in pain without device (05/23/14)   Time 8   Period Weeks   Status On-going   PT LONG TERM GOAL #4   Title RLE circumferential measurement within 0.5 cm of LLE for decreased edema and improved function (05/23/14)   Time 8   Period Weeks   Status On-going               Plan - 04/18/14 1015    Clinical Impression Statement She continues to present with R knee sweling that is limiting her AROM secondary to increased tightness at end range.  Following Mobilizations she was able to achieve 68 degrees  of knee flexion and -8 degrees of extension. She tolerated  transiton to 6"step for step ups x 8 reps   Pt will benefit from skilled therapeutic intervention in order to improve on the following deficits Abnormal gait;Decreased balance;Difficulty walking;Decreased range of motion;Decreased strength;Decreased activity tolerance;Decreased mobility;Pain;Impaired  flexibility;Increased edema;Decreased knowledge of use of DME   Rehab Potential Good   PT Next Visit Plan mobs for ROM; passive ROM for flexion and extension; quad activation exercises; cryotherapy; recommendation for ERMI Flexionator or dynamic splint   PT Home Exercise Plan aggressive ROM; edema management        Problem List Patient Active  Problem List   Diagnosis Date Noted  . Right knee pain 08/23/2013  . Left knee pain 06/02/2013  . Migraine 03/16/2012  . Obesity (BMI 30.0-34.9) 11/25/2011  . Allergic rhinitis 11/25/2011  . Primary localized osteoarthritis of right knee 11/25/2011  . History of viral pericarditis 11/25/2011    Starr Lake PT, DPT 04/18/2014 10:35 AM   [place] [facility] [address] [place], [place], [address] Phone: [phone]   Fax:  [phone]

## 2014-04-20 ENCOUNTER — Ambulatory Visit: Payer: PRIVATE HEALTH INSURANCE

## 2014-04-20 ENCOUNTER — Ambulatory Visit: Payer: PRIVATE HEALTH INSURANCE | Admitting: Physical Therapy

## 2014-04-20 DIAGNOSIS — M25661 Stiffness of right knee, not elsewhere classified: Secondary | ICD-10-CM

## 2014-04-20 DIAGNOSIS — R269 Unspecified abnormalities of gait and mobility: Secondary | ICD-10-CM

## 2014-04-20 DIAGNOSIS — Z96651 Presence of right artificial knee joint: Secondary | ICD-10-CM | POA: Diagnosis not present

## 2014-04-20 DIAGNOSIS — M6281 Muscle weakness (generalized): Secondary | ICD-10-CM

## 2014-04-20 NOTE — Therapy (Signed)
[place], [place], [address] Phone: [phone]   Fax:  [phone]  Physical Therapy Treatment  Patient Details  Name: Dawn [name] MRN: [id number] Date of Birth: [birth year] Referring Provider:  Kandice Hams, MD  Encounter Date: 04/20/2014      PT End of Session - 04/20/14 1108    Visit Number 9   Number of Visits 24   Date for PT Re-Evaluation 05/27/14   PT Start Time 0930   PT Stop Time 1028   PT Time Calculation (min) 58 min   Activity Tolerance Patient limited by pain   Behavior During Therapy [facility] for tasks assessed/performed      Past Medical History  Diagnosis Date  . Acute meniscal tear of knee LEFT  . Seasonal allergies   . Environmental allergies   . History of gastric ulcer AS TEEN  . 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 Arthritis   . Asthma   . Contact lens/glasses fitting     wears contacts or glasses  . Primary localized osteoarthritis of right knee 11/25/2011    S/P Left Knee arthroscopy performed by Dr. Alvan Dame in April 2013.     Past Surgical History  Procedure Laterality Date  . Vaginal hysterectomy  03-18-1999  . Cholecystectomy  1992  . Appendectomy  1998  . Bilateral carpal tunnel release  1980'S  . Transthoracic echocardiogram  04-26-2009    NORMAL LVSF/ EF 60-65% / LVDF NORMAL / NO PERICARDIAL EFFUSION  . Knee arthroscopy  06/20/2011    Procedure: ARTHROSCOPY KNEE;  Surgeon: Mauri Pole, MD;  Location: [facility];  Service: Orthopedics;  Laterality: Left;  left knee scope   latex allergy patient blisters and swells  . Breast reduction surgery Bilateral 12/21/2012    Procedure: BILATERAL MAMMARY REDUCTION  (BREAST);  Surgeon: Crissie Reese, MD;  Location: [place];  Service: Plastics;  Laterality: Bilateral;  . Partial knee arthroplasty Right 03/03/2014   Procedure: RIGHT UNICOMPARTMENTAL KNEE ARTHROPLASTY;  Surgeon: Johnny Bridge, MD;  Location: [place];  Service: Orthopedics;  Laterality: Right;    There were no vitals taken for this visit.  Visit Diagnosis:  Knee stiffness, right  S/P right unicompartmental knee replacement  Abnormality of gait  Muscle weakness of lower extremity      Subjective Assessment - 04/20/14 0937    Symptoms knee feels okay today.   How long can you sit comfortably? 2 hours   How long can you walk comfortably? 20-30 min   Patient Stated Goals to be pain free   Currently in Pain? Yes   Pain Score 0-No pain  "just tightness"                    [facility] Adult PT Treatment/Exercise - 04/20/14 0939    Knee/Hip Exercises: Aerobic   Stationary Bike Nu-Step L5 5 min   Modalities   Modalities Cryotherapy   Cryotherapy   Number Minutes Cryotherapy 20 Minutes   Cryotherapy Location Knee   Type of Cryotherapy Other (comment)  vasopneumatic mod pressure   Manual Therapy   Manual Therapy Joint mobilization;Myofascial release;Passive ROM;Other (comment)   Joint Mobilization grade 2-3 knee A/P mobs for flexion/ext and patella mobs grades 2-3   Passive ROM seated and supine with 90 degrees hip flexion  PT Short Term Goals - 04/20/14 1108    PT SHORT TERM GOAL #1   Title independent with HEP (04/25/14)   Status Achieved   PT SHORT TERM GOAL #2   Title improve R knee active ROM 5-75 for improved mobility (04/25/14)   Status Partially Met   PT SHORT TERM GOAL #3   Title ambulate > 150' with single point cane modified independent for improved mobility (04/25/14)   Status Achieved   PT SHORT TERM GOAL #4   Title report pain < 3/10 for improved function and decreased pain (04/25/14)   Status On-going           PT Long Term Goals - 04/20/14 1109    PT LONG TERM GOAL #1   Title independent with advanced HEP (05/23/14)   Status On-going   PT LONG TERM  GOAL #2   Title improve R knee active ROM 0-110 for improved function and mobility (05/23/14)   Status On-going   PT LONG TERM GOAL #3   Title report ability to ambulate > 30 min without increase in pain without device (05/23/14)   Status On-going   PT LONG TERM GOAL #4   Title RLE circumferential measurement within 0.5 cm of LLE for decreased edema and improved function (05/23/14)   Status On-going               Plan - 04/20/14 1108    PT Next Visit Plan mobs for ROM; passive ROM for flexion and extension; quad activation exercises; cryotherapy; recommendation for ERMI Flexionator or dynamic splint   Consulted and Agree with Plan of Care Patient        Problem List Patient Active Problem List   Diagnosis Date Noted  . Right knee pain 08/23/2013  . Left knee pain 06/02/2013  . Migraine 03/16/2012  . Obesity (BMI 30.0-34.9) 11/25/2011  . Allergic rhinitis 11/25/2011  . Primary localized osteoarthritis of right knee 11/25/2011  . History of viral pericarditis 11/25/2011   Laureen Abrahams, PT, DPT 04/20/2014 11:11 AM  [facility] [address] [place], [place], [address] Phone: [phone]   Fax:  [phone]

## 2014-04-21 ENCOUNTER — Ambulatory Visit: Payer: PRIVATE HEALTH INSURANCE | Admitting: Physical Therapy

## 2014-04-21 DIAGNOSIS — Z96651 Presence of right artificial knee joint: Secondary | ICD-10-CM | POA: Diagnosis not present

## 2014-04-21 DIAGNOSIS — R269 Unspecified abnormalities of gait and mobility: Secondary | ICD-10-CM

## 2014-04-21 DIAGNOSIS — M6281 Muscle weakness (generalized): Secondary | ICD-10-CM

## 2014-04-21 DIAGNOSIS — M25661 Stiffness of right knee, not elsewhere classified: Secondary | ICD-10-CM

## 2014-04-21 NOTE — Therapy (Signed)
[place], [place], [address] Phone: [phone]   Fax:  [phone]  Physical Therapy Treatment  Patient Details  Name: Dawn [name] MRN: [id number] Date of Birth: February 25, 1966 Referring Provider:  Kandice Hams, MD  Encounter Date: 04/21/2014      PT End of Session - 04/21/14 1042    Visit Number 10   Number of Visits 24   Date for PT Re-Evaluation 05/27/14   PT Start Time 0930   PT Stop Time 1043   PT Time Calculation (min) 73 min   Activity Tolerance Patient limited by pain   Behavior During Therapy [facility] for tasks assessed/performed      Past Medical History  Diagnosis Date  . Acute meniscal tear of knee LEFT  . Seasonal allergies   . Environmental allergies   . History of gastric ulcer AS TEEN  . 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 Arthritis   . Asthma   . Contact lens/glasses fitting     wears contacts or glasses  . Primary localized osteoarthritis of right knee 11/25/2011    S/P Left Knee arthroscopy performed by Dr. Alvan Dame in April 2013.     Past Surgical History  Procedure Laterality Date  . Vaginal hysterectomy  03-18-1999  . Cholecystectomy  1992  . Appendectomy  1998  . Bilateral carpal tunnel release  1980'S  . Transthoracic echocardiogram  04-26-2009    NORMAL LVSF/ EF 60-65% / LVDF NORMAL / NO PERICARDIAL EFFUSION  . Knee arthroscopy  06/20/2011    Procedure: ARTHROSCOPY KNEE;  Surgeon: Mauri Pole, MD;  Location: [facility];  Service: Orthopedics;  Laterality: Left;  left knee scope   latex allergy patient blisters and swells  . Breast reduction surgery Bilateral 12/21/2012    Procedure: BILATERAL MAMMARY REDUCTION  (BREAST);  Surgeon: Crissie Reese, MD;  Location: [place];  Service: Plastics;  Laterality: Bilateral;  . Partial knee arthroplasty Right 03/03/2014   Procedure: RIGHT UNICOMPARTMENTAL KNEE ARTHROPLASTY;  Surgeon: Johnny Bridge, MD;  Location: [place];  Service: Orthopedics;  Laterality: Right;    There were no vitals taken for this visit.  Visit Diagnosis:  S/P right unicompartmental knee replacement  Knee stiffness, right  Abnormality of gait  Muscle weakness of lower extremity      Subjective Assessment - 04/21/14 0937    Symptoms working on bending knee more   Pertinent History asthma   Limitations Sitting;Standing;Walking;House hold activities   How long can you sit comfortably? 2 hours   How long can you stand comfortably? 20-30 min   How long can you walk comfortably? 20-30 min   Patient Stated Goals to be pain free   Currently in Pain? Yes   Pain Score 3    Pain Location Knee   Pain Orientation Right   Pain Descriptors / Indicators Tightness   Pain Type Surgical pain   Pain Onset More than a month ago   Pain Frequency Constant   Aggravating Factors  bending knee   Pain Relieving Factors meds          [facility] PT Assessment - 04/21/14 1033    AROM   Right Knee Flexion 63   PROM   Right/Left Knee Right  flexion 72 degrees                  [facility]  Adult PT Treatment/Exercise - 04/21/14 1033    Ambulation/Gait   Gait Comments gait training with mirror in parallel bars to decrease gait compensations   Knee/Hip Exercises: Aerobic   Stationary Bike Nu-Step L5 10 min   Knee/Hip Exercises: Supine   Quad Sets 10 reps;AROM;Strengthening;Right   Modalities   Modalities Cryotherapy;Electrical Stimulation   Cryotherapy   Number Minutes Cryotherapy 20 Minutes   Cryotherapy Location Knee   Type of Cryotherapy Other (comment)  vasopneumatic mod pressure   Electrical Stimulation   Electrical Stimulation Location R VMO   Electrical Stimulation Action trialed VMS and [place] without quad contraction   Manual Therapy   Manual Therapy Passive ROM   Passive ROM contract relax for knee  flexion multiple reps x 17 min                  PT Short Term Goals - 04/20/14 1108    PT SHORT TERM GOAL #1   Title independent with HEP (04/25/14)   Status Achieved   PT SHORT TERM GOAL #2   Title improve R knee active ROM 5-75 for improved mobility (04/25/14)   Status Partially Met   PT SHORT TERM GOAL #3   Title ambulate > 150' with single point cane modified independent for improved mobility (04/25/14)   Status Achieved   PT SHORT TERM GOAL #4   Title report pain < 3/10 for improved function and decreased pain (04/25/14)   Status On-going           PT Long Term Goals - 04/20/14 1109    PT LONG TERM GOAL #1   Title independent with advanced HEP (05/23/14)   Status On-going   PT LONG TERM GOAL #2   Title improve R knee active ROM 0-110 for improved function and mobility (05/23/14)   Status On-going   PT LONG TERM GOAL #3   Title report ability to ambulate > 30 min without increase in pain without device (05/23/14)   Status On-going   PT LONG TERM GOAL #4   Title RLE circumferential measurement within 0.5 cm of LLE for decreased edema and improved function (05/23/14)   Status On-going               Plan - 04/21/14 1045    Clinical Impression Statement ROM slowly progressing and improved gait mechanics with visual and verbal cues   PT Frequency 3x / week   PT Duration 8 weeks   PT Next Visit Plan mobs for ROM; passive ROM for flexion and extension; quad activation exercises; cryotherapy; recommendation for ERMI Flexionator or dynamic splint   PT Home Exercise Plan aggressive ROM; edema management   Consulted and Agree with Plan of Care Patient        Problem List Patient Active Problem List   Diagnosis Date Noted  . Right knee pain 08/23/2013  . Left knee pain 06/02/2013  . Migraine 03/16/2012  . Obesity (BMI 30.0-34.9) 11/25/2011  . Allergic rhinitis 11/25/2011  . Primary localized osteoarthritis of right knee 11/25/2011  . History of viral  pericarditis 11/25/2011   Laureen Abrahams, PT, DPT 04/21/2014 10:49 AM  [facility] [address] [place], [place], [address] Phone: [phone]   Fax:  [phone]

## 2014-04-24 ENCOUNTER — Ambulatory Visit: Payer: PRIVATE HEALTH INSURANCE | Admitting: Rehabilitation

## 2014-04-24 DIAGNOSIS — Z96651 Presence of right artificial knee joint: Secondary | ICD-10-CM

## 2014-04-24 DIAGNOSIS — R269 Unspecified abnormalities of gait and mobility: Secondary | ICD-10-CM

## 2014-04-24 DIAGNOSIS — M6281 Muscle weakness (generalized): Secondary | ICD-10-CM

## 2014-04-24 DIAGNOSIS — M25661 Stiffness of right knee, not elsewhere classified: Secondary | ICD-10-CM

## 2014-04-24 NOTE — Therapy (Signed)
[place], [place], [address] Phone: [phone]   Fax:  [phone]  Physical Therapy Treatment  Patient Details  Name: Dawn [name] MRN: [id number] Date of Birth: 11-06-1965 Referring Provider:  Kandice Hams, MD  Encounter Date: 04/24/2014      PT End of Session - 04/24/14 1115    Visit Number 11   Number of Visits 24   Date for PT Re-Evaluation 05/27/14   PT Start Time 0931   PT Stop Time 1040   PT Time Calculation (min) 69 min      Past Medical History  Diagnosis Date  . Acute meniscal tear of knee LEFT  . Seasonal allergies   . Environmental allergies   . History of gastric ulcer AS TEEN  . 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 Arthritis   . Asthma   . Contact lens/glasses fitting     wears contacts or glasses  . Primary localized osteoarthritis of right knee 11/25/2011    S/P Left Knee arthroscopy performed by Dr. Alvan Dame in April 2013.     Past Surgical History  Procedure Laterality Date  . Vaginal hysterectomy  03-18-1999  . Cholecystectomy  1992  . Appendectomy  1998  . Bilateral carpal tunnel release  1980'S  . Transthoracic echocardiogram  04-26-2009    NORMAL LVSF/ EF 60-65% / LVDF NORMAL / NO PERICARDIAL EFFUSION  . Knee arthroscopy  06/20/2011    Procedure: ARTHROSCOPY KNEE;  Surgeon: Mauri Pole, MD;  Location: [facility];  Service: Orthopedics;  Laterality: Left;  left knee scope   latex allergy patient blisters and swells  . Breast reduction surgery Bilateral 12/21/2012    Procedure: BILATERAL MAMMARY REDUCTION  (BREAST);  Surgeon: Crissie Reese, MD;  Location: [place];  Service: Plastics;  Laterality: Bilateral;  . Partial knee arthroplasty Right 03/03/2014    Procedure: RIGHT UNICOMPARTMENTAL KNEE ARTHROPLASTY;  Surgeon: Johnny Bridge, MD;  Location: [place];  Service: Orthopedics;  Laterality: Right;    There were no vitals taken for this visit.  Visit Diagnosis:  S/P right unicompartmental knee replacement  Knee stiffness, right  Abnormality of gait  Muscle weakness of lower extremity      Subjective Assessment - 04/24/14 0939    Symptoms 3/10 right knee. I walked several trips up and down my driveway yesterday as well as doing laps inside my home.    Pain Descriptors / Indicators Tightness   Pain Type Surgical pain   Pain Onset More than a month ago   Pain Frequency Constant   Aggravating Factors  flexion stretches   Pain Relieving Factors meds          [facility] PT Assessment - 04/24/14 0001    AROM   Right Knee Extension -8   Right Knee Flexion 74  AAROM   PROM   Right/Left Knee Right  82 degrees   Special Tests    Special Tests Swelling Tests   Swelling Tests other   other   Comments superior pole of patella: R 43.5 cm; L42.0                  [facility] Adult PT Treatment/Exercise - 04/24/14 0001    Knee/Hip Exercises: Aerobic   Stationary Bike Nustep L2 x 8 min LE only   Knee/Hip Exercises: Supine   Quad Sets 10  reps;AROM;Strengthening;Right   Knee/Hip Exercises: Prone   Hamstring Curl 10 reps   Contract/Relax to Increase Flexion 3 bouts    Other Prone Exercises knee flexion stretch with strap 3 x 30 sec   Modalities   Modalities Cryotherapy   Cryotherapy   Number Minutes Cryotherapy 20 Minutes   Cryotherapy Location Knee   Type of Cryotherapy Other (comment)   Manual Therapy   Manual Therapy Joint mobilization;Passive ROM   Joint Mobilization grade 2-3 knee A/P mobs for flexion/ext and patella mobs grades 2-3                  PT Short Term Goals - 04/20/14 1108    PT SHORT TERM GOAL #1   Title independent with HEP (04/25/14)   Status Achieved   PT SHORT TERM GOAL #2   Title improve R knee active ROM 5-75 for improved mobility (04/25/14)   Status Partially Met   PT SHORT  TERM GOAL #3   Title ambulate > 150' with single point cane modified independent for improved mobility (04/25/14)   Status Achieved   PT SHORT TERM GOAL #4   Title report pain < 3/10 for improved function and decreased pain (04/25/14)   Status On-going           PT Long Term Goals - 04/20/14 1109    PT LONG TERM GOAL #1   Title independent with advanced HEP (05/23/14)   Status On-going   PT LONG TERM GOAL #2   Title improve R knee active ROM 0-110 for improved function and mobility (05/23/14)   Status On-going   PT LONG TERM GOAL #3   Title report ability to ambulate > 30 min without increase in pain without device (05/23/14)   Status On-going   PT LONG TERM GOAL #4   Title RLE circumferential measurement within 0.5 cm of LLE for decreased edema and improved function (05/23/14)   Status On-going               Plan - 04/24/14 1112    Clinical Impression Statement continued slow A/PROM improvements. Jas splint may be ordered soon. Edema decreased since eval.    PT Next Visit Plan mobs for ROM; passive ROM for flexion and extension; quad activation exercises; cryotherapy; may try strap mobs for flexion        Problem List Patient Active Problem List   Diagnosis Date Noted  . Right knee pain 08/23/2013  . Left knee pain 06/02/2013  . Migraine 03/16/2012  . Obesity (BMI 30.0-34.9) 11/25/2011  . Allergic rhinitis 11/25/2011  . Primary localized osteoarthritis of right knee 11/25/2011  . History of viral pericarditis 11/25/2011    Dorene Ar, [place] 04/24/2014, 11:17 AM  [facility] [address] [place], [place], [address] Phone: [phone]   Fax:  [phone]

## 2014-04-27 ENCOUNTER — Ambulatory Visit: Payer: PRIVATE HEALTH INSURANCE | Attending: Internal Medicine | Admitting: Physical Therapy

## 2014-04-27 DIAGNOSIS — R269 Unspecified abnormalities of gait and mobility: Secondary | ICD-10-CM

## 2014-04-27 DIAGNOSIS — M6281 Muscle weakness (generalized): Secondary | ICD-10-CM | POA: Diagnosis not present

## 2014-04-27 DIAGNOSIS — Z96651 Presence of right artificial knee joint: Secondary | ICD-10-CM

## 2014-04-27 DIAGNOSIS — M25661 Stiffness of right knee, not elsewhere classified: Secondary | ICD-10-CM | POA: Diagnosis not present

## 2014-04-27 NOTE — Therapy (Signed)
[place], [place], [address] Phone: [phone]   Fax:  [phone]  Physical Therapy Treatment  Patient Details  Name: Dawn [name] MRN: [id number] Date of Birth: [birth year] Referring Provider:  Kandice Hams, MD  Encounter Date: 04/27/2014      PT End of Session - 04/27/14 1024    Visit Number 12   Number of Visits 24   Date for PT Re-Evaluation 05/27/14   PT Start Time 0930   PT Stop Time 1037   PT Time Calculation (min) 67 min   Activity Tolerance Patient limited by pain;Patient tolerated treatment well   Behavior During Therapy [facility] for tasks assessed/performed      Past Medical History  Diagnosis Date  . Acute meniscal tear of knee LEFT  . Seasonal allergies   . Environmental allergies   . History of gastric ulcer AS TEEN  . 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 Arthritis   . Asthma   . Contact lens/glasses fitting     wears contacts or glasses  . Primary localized osteoarthritis of right knee 11/25/2011    S/P Left Knee arthroscopy performed by Dr. Alvan Dame in April 2013.     Past Surgical History  Procedure Laterality Date  . Vaginal hysterectomy  03-18-1999  . Cholecystectomy  1992  . Appendectomy  1998  . Bilateral carpal tunnel release  1980'S  . Transthoracic echocardiogram  04-26-2009    NORMAL LVSF/ EF 60-65% / LVDF NORMAL / NO PERICARDIAL EFFUSION  . Knee arthroscopy  06/20/2011    Procedure: ARTHROSCOPY KNEE;  Surgeon: Mauri Pole, MD;  Location: [facility];  Service: Orthopedics;  Laterality: Left;  left knee scope   latex allergy patient blisters and swells  . Breast reduction surgery Bilateral 12/21/2012    Procedure: BILATERAL MAMMARY REDUCTION  (BREAST);  Surgeon: Crissie Reese, MD;  Location: [place];  Service: Plastics;  Laterality: Bilateral;  . Partial knee  arthroplasty Right 03/03/2014    Procedure: RIGHT UNICOMPARTMENTAL KNEE ARTHROPLASTY;  Surgeon: Johnny Bridge, MD;  Location: [place];  Service: Orthopedics;  Laterality: Right;    There were no vitals taken for this visit.  Visit Diagnosis:  S/P right unicompartmental knee replacement  Knee stiffness, right  Abnormality of gait  Muscle weakness of lower extremity      Subjective Assessment - 04/27/14 0937    Symptoms Accepted that may need manipulation, but still wants to work hard.  Over did it Tuesday   Pertinent History asthma   Limitations Sitting;Standing;Walking;House hold activities   Currently in Pain? No/denies  just tightness   Pain Location Knee   Pain Orientation Right   Pain Descriptors / Indicators Tightness                    [facility] Adult PT Treatment/Exercise - 04/27/14 0938    Knee/Hip Exercises: Aerobic   Stationary Bike partial revolutions x 10 min   Knee/Hip Exercises: Supine   Bridges Strengthening;15 reps   Bridges Limitations on physioball   Straight Leg Raises Right;15 reps   Straight Leg Raises Limitations 2#   Other Supine Knee Exercises LEs on ball for flexion 20x   Other Supine Knee Exercises bridging with hamstring curls x 10 on physioball   Knee/Hip Exercises: Prone   Hamstring Curl 15 reps   Hamstring Curl Limitations with  strap for AAROM with 10 sec hold   Hip Extension Strengthening;Right;15 reps   Hip Extension Limitations 2#   Contract/Relax to Increase Flexion 3 bouts    Modalities   Modalities Cryotherapy   Cryotherapy   Number Minutes Cryotherapy 20 Minutes   Cryotherapy Location Knee   Type of Cryotherapy Other (comment)  vasopneumatic mod pressure                  PT Short Term Goals - 04/20/14 1108    PT SHORT TERM GOAL #1   Title independent with HEP (04/25/14)   Status Achieved   PT SHORT TERM GOAL #2   Title improve R knee active ROM 5-75 for improved mobility (04/25/14)   Status  Partially Met   PT SHORT TERM GOAL #3   Title ambulate > 150' with single point cane modified independent for improved mobility (04/25/14)   Status Achieved   PT SHORT TERM GOAL #4   Title report pain < 3/10 for improved function and decreased pain (04/25/14)   Status On-going           PT Long Term Goals - 04/20/14 1109    PT LONG TERM GOAL #1   Title independent with advanced HEP (05/23/14)   Status On-going   PT LONG TERM GOAL #2   Title improve R knee active ROM 0-110 for improved function and mobility (05/23/14)   Status On-going   PT LONG TERM GOAL #3   Title report ability to ambulate > 30 min without increase in pain without device (05/23/14)   Status On-going   PT LONG TERM GOAL #4   Title RLE circumferential measurement within 0.5 cm of LLE for decreased edema and improved function (05/23/14)   Status On-going               Plan - 04/27/14 1134    PT Next Visit Plan mobs for ROM; passive ROM for flexion and extension; quad activation exercises; cryotherapy; may try strap mobs for flexion   PT Home Exercise Plan aggressive ROM; edema management   Consulted and Agree with Plan of Care Patient        Problem List Patient Active Problem List   Diagnosis Date Noted  . Right knee pain 08/23/2013  . Left knee pain 06/02/2013  . Migraine 03/16/2012  . Obesity (BMI 30.0-34.9) 11/25/2011  . Allergic rhinitis 11/25/2011  . Primary localized osteoarthritis of right knee 11/25/2011  . History of viral pericarditis 11/25/2011   Laureen Abrahams, PT, DPT 04/27/2014 11:36 AM  [facility] [address] [place], [place], [address] Phone: [phone]   Fax:  [phone]

## 2014-04-28 ENCOUNTER — Ambulatory Visit: Payer: PRIVATE HEALTH INSURANCE | Admitting: Physical Therapy

## 2014-04-28 DIAGNOSIS — M25661 Stiffness of right knee, not elsewhere classified: Secondary | ICD-10-CM

## 2014-04-28 DIAGNOSIS — Z96651 Presence of right artificial knee joint: Secondary | ICD-10-CM

## 2014-04-28 DIAGNOSIS — R269 Unspecified abnormalities of gait and mobility: Secondary | ICD-10-CM

## 2014-04-28 DIAGNOSIS — M6281 Muscle weakness (generalized): Secondary | ICD-10-CM

## 2014-04-28 NOTE — Therapy (Signed)
[place], [place], [address] Phone: [phone]   Fax:  [phone]  Physical Therapy Treatment  Patient Details  Name: Dawn [name] MRN: [id number] Date of Birth: 1965-12-26 Referring Provider:  Kandice Hams, MD  Encounter Date: 04/28/2014      PT End of Session - 04/28/14 1021    Visit Number 13   Number of Visits 24   Date for PT Re-Evaluation 05/27/14   PT Start Time 0937   PT Stop Time 1037   PT Time Calculation (min) 60 min   Activity Tolerance Patient limited by pain;Patient tolerated treatment well   Behavior During Therapy [facility] for tasks assessed/performed      Past Medical History  Diagnosis Date  . Acute meniscal tear of knee LEFT  . Seasonal allergies   . Environmental allergies   . History of gastric ulcer AS TEEN  . 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 Arthritis   . Asthma   . Contact lens/glasses fitting     wears contacts or glasses  . Primary localized osteoarthritis of right knee 11/25/2011    S/P Left Knee arthroscopy performed by Dr. Alvan Dame in April 2013.     Past Surgical History  Procedure Laterality Date  . Vaginal hysterectomy  03-18-1999  . Cholecystectomy  1992  . Appendectomy  1998  . Bilateral carpal tunnel release  1980'S  . Transthoracic echocardiogram  04-26-2009    NORMAL LVSF/ EF 60-65% / LVDF NORMAL / NO PERICARDIAL EFFUSION  . Knee arthroscopy  06/20/2011    Procedure: ARTHROSCOPY KNEE;  Surgeon: Mauri Pole, MD;  Location: [facility];  Service: Orthopedics;  Laterality: Left;  left knee scope   latex allergy patient blisters and swells  . Breast reduction surgery Bilateral 12/21/2012    Procedure: BILATERAL MAMMARY REDUCTION  (BREAST);  Surgeon: Crissie Reese, MD;  Location: [place];  Service: Plastics;  Laterality: Bilateral;  . Partial knee  arthroplasty Right 03/03/2014    Procedure: RIGHT UNICOMPARTMENTAL KNEE ARTHROPLASTY;  Surgeon: Johnny Bridge, MD;  Location: [place];  Service: Orthopedics;  Laterality: Right;    There were no vitals taken for this visit.  Visit Diagnosis:  S/P right unicompartmental knee replacement  Knee stiffness, right  Abnormality of gait  Muscle weakness of lower extremity      Subjective Assessment - 04/28/14 0944    Symptoms Sore last night after session but feels okay today; goes to MD Wednesday   Pertinent History asthma   Currently in Pain? Yes   Pain Score 3    Pain Location Knee   Pain Orientation Right   Pain Descriptors / Indicators Tightness   Pain Type Surgical pain   Pain Onset More than a month ago   Pain Frequency Constant                    [facility] Adult PT Treatment/Exercise - 04/28/14 0947    Knee/Hip Exercises: Aerobic   Stationary Bike Nustep level 5 x 8 min; 4 extremities for ROM   Knee/Hip Exercises: Machines for Strengthening   Cybex Leg Press 20# 2x10 with focus on returning to flexion; RLE only 20# 2 x 10   Knee/Hip Exercises: Standing   Forward Step Up Right;10 reps;Hand Hold: 1;Step Height: 4"   Step Down 2 sets;10 reps;Hand Hold: 1;Step Height: 4";Step Height:  6";5 reps   Step Down Limitations 6" x 5 reps; 4" x 10 reps; cues to decrease substitution   Modalities   Modalities Cryotherapy   Cryotherapy   Number Minutes Cryotherapy 20 Minutes   Cryotherapy Location Knee   Type of Cryotherapy Other (comment)  vasopneumatic mod pressure                  PT Short Term Goals - 04/20/14 1108    PT SHORT TERM GOAL #1   Title independent with HEP (04/25/14)   Status Achieved   PT SHORT TERM GOAL #2   Title improve R knee active ROM 5-75 for improved mobility (04/25/14)   Status Partially Met   PT SHORT TERM GOAL #3   Title ambulate > 150' with single point cane modified independent for improved mobility (04/25/14)    Status Achieved   PT SHORT TERM GOAL #4   Title report pain < 3/10 for improved function and decreased pain (04/25/14)   Status On-going           PT Long Term Goals - 04/20/14 1109    PT LONG TERM GOAL #1   Title independent with advanced HEP (05/23/14)   Status On-going   PT LONG TERM GOAL #2   Title improve R knee active ROM 0-110 for improved function and mobility (05/23/14)   Status On-going   PT LONG TERM GOAL #3   Title report ability to ambulate > 30 min without increase in pain without device (05/23/14)   Status On-going   PT LONG TERM GOAL #4   Title RLE circumferential measurement within 0.5 cm of LLE for decreased edema and improved function (05/23/14)   Status On-going               Plan - 04/28/14 1021    Clinical Impression Statement Pt arrived late for session; progressing strengthening/weight bearing activities.  Pt to get JAS splint today.   PT Next Visit Plan measure and send updated note to MD   PT Home Exercise Plan aggressive ROM; edema management   Consulted and Agree with Plan of Care Patient        Problem List Patient Active Problem List   Diagnosis Date Noted  . Right knee pain 08/23/2013  . Left knee pain 06/02/2013  . Migraine 03/16/2012  . Obesity (BMI 30.0-34.9) 11/25/2011  . Allergic rhinitis 11/25/2011  . Primary localized osteoarthritis of right knee 11/25/2011  . History of viral pericarditis 11/25/2011   Laureen Abrahams, PT, DPT 04/28/2014 10:48 AM  [facility] [address] [place], [place], [address] Phone: [phone]   Fax:  [phone]

## 2014-05-02 ENCOUNTER — Ambulatory Visit: Payer: PRIVATE HEALTH INSURANCE | Admitting: Physical Therapy

## 2014-05-02 DIAGNOSIS — R269 Unspecified abnormalities of gait and mobility: Secondary | ICD-10-CM

## 2014-05-02 DIAGNOSIS — Z96651 Presence of right artificial knee joint: Secondary | ICD-10-CM

## 2014-05-02 DIAGNOSIS — M25661 Stiffness of right knee, not elsewhere classified: Secondary | ICD-10-CM

## 2014-05-02 DIAGNOSIS — M6281 Muscle weakness (generalized): Secondary | ICD-10-CM

## 2014-05-02 NOTE — Therapy (Signed)
[place], [place], [address] Phone: [phone]   Fax:  [phone]  Physical Therapy Treatment  Patient Details  Name: Dawn [name] MRN: [id number] Date of Birth: [birth year] Referring Provider:  Seward Carol, MD  Encounter Date: 05/02/2014      PT End of Session - 05/02/14 1030    Visit Number 14   Number of Visits 24   Date for PT Re-Evaluation 05/27/14   PT Start Time 0938   PT Stop Time 1042   PT Time Calculation (min) 64 min   Activity Tolerance Patient tolerated treatment well;Patient limited by pain      Past Medical History  Diagnosis Date  . Acute meniscal tear of knee LEFT  . Seasonal allergies   . Environmental allergies   . History of gastric ulcer AS TEEN  . 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 Arthritis   . Asthma   . Contact lens/glasses fitting     wears contacts or glasses  . Primary localized osteoarthritis of right knee 11/25/2011    S/P Left Knee arthroscopy performed by Dr. Alvan Dame in April 2013.     Past Surgical History  Procedure Laterality Date  . Vaginal hysterectomy  03-18-1999  . Cholecystectomy  1992  . Appendectomy  1998  . Bilateral carpal tunnel release  1980'S  . Transthoracic echocardiogram  04-26-2009    NORMAL LVSF/ EF 60-65% / LVDF NORMAL / NO PERICARDIAL EFFUSION  . Knee arthroscopy  06/20/2011    Procedure: ARTHROSCOPY KNEE;  Surgeon: Mauri Pole, MD;  Location: [facility];  Service: Orthopedics;  Laterality: Left;  left knee scope   latex allergy patient blisters and swells  . Breast reduction surgery Bilateral 12/21/2012    Procedure: BILATERAL MAMMARY REDUCTION  (BREAST);  Surgeon: Crissie Reese, MD;  Location: [place];  Service: Plastics;  Laterality: Bilateral;  . Partial knee arthroplasty Right 03/03/2014    Procedure: RIGHT UNICOMPARTMENTAL  KNEE ARTHROPLASTY;  Surgeon: Johnny Bridge, MD;  Location: [place];  Service: Orthopedics;  Laterality: Right;    There were no vitals taken for this visit.  Visit Diagnosis:  S/P right unicompartmental knee replacement  Knee stiffness, right  Abnormality of gait  Muscle weakness of lower extremity      Subjective Assessment - 05/02/14 0945    Symptoms Arrives 8 min late.  Got the brace on Friday and I think it has helped.  Today was the first day I could walk to the bathroom without the cane.  Decreased use of pain meds.     Pertinent History asthma   Limitations Sitting;Standing;Walking;House hold activities   Currently in Pain? Yes   Pain Score 3    Pain Location Knee   Pain Orientation Right   Pain Onset More than a month ago   Pain Frequency Constant   Aggravating Factors  bending knee   Pain Relieving Factors pain meds          [facility] PT Assessment - 05/02/14 0959    ROM / Strength   AROM / PROM / Strength Strength   AROM   Right Knee Extension 2  supine 2 degrees; 10 degrees seated   Right Knee Flexion 75   PROM   PROM Assessment Site Knee   Right/Left Knee Right  82   Strength   Strength Assessment Site Knee  Right/Left Knee Right   Right Knee Flexion 4-/5   Right Knee Extension 4-/5                  [facility] Adult PT Treatment/Exercise - 05/02/14 0947    Knee/Hip Exercises: Aerobic   Stationary Bike 5 min   Knee/Hip Exercises: Machines for Strengthening   Cybex Leg Press 40# bilateral 20x; 20# right only 20x seat 9   Knee/Hip Exercises: Standing   Lateral Step Up Right;10 reps;Hand Hold: 1;Step Height: 6"   Forward Step Up Right;10 reps;Hand Hold: 1;Step Height: 6"   Step Down Right;10 reps;Hand Hold: 1;Step Height: 2"   Knee/Hip Exercises: Seated   Long Arc [employer] reps;Both   Long Arc Quad Weight 3 lbs.   Other Seated Knee Exercises HS pullbacks red band 25x   Modalities   Modalities Cryotherapy   Cryotherapy    Number Minutes Cryotherapy 20 Minutes   Cryotherapy Location Knee   Type of Cryotherapy Other (comment)  vasocompression mod 32 degrees   Manual Therapy   Manual Therapy Joint mobilization;Passive ROM   Joint Mobilization grade 2-3 knee A/P mobs for flexion/ext and patella mobs grades 2-3   Myofascial Release distal quad mobilization   Passive ROM contract relax for knee flexion multiple reps x 17 min                  PT Short Term Goals - 05/02/14 1510    PT SHORT TERM GOAL #1   Title independent with HEP (04/25/14)   Status Achieved   PT SHORT TERM GOAL #2   Title improve R knee active ROM 5-75 for improved mobility (04/25/14)   Time 4   Period Weeks   Status Partially Met   PT SHORT TERM GOAL #3   Title ambulate > 150' with single point cane modified independent for improved mobility (04/25/14)   Status Achieved   PT SHORT TERM GOAL #4   Title report pain < 3/10 for improved function and decreased pain (04/25/14)   Time 4   Period Weeks   Status Partially Met           PT Long Term Goals - 05/02/14 1510    PT LONG TERM GOAL #1   Title independent with advanced HEP (05/23/14)   Time 8   Period Weeks   Status On-going   PT LONG TERM GOAL #2   Title improve R knee active ROM 0-110 for improved function and mobility (05/23/14)   Time 8   Period Weeks   Status On-going   PT LONG TERM GOAL #3   Title report ability to ambulate > 30 min without increase in pain without device (05/23/14)   Time 8   Period Weeks   Status On-going   PT LONG TERM GOAL #4   Title RLE circumferential measurement within 0.5 cm of LLE for decreased edema and improved function (05/23/14)   Time 8   Period Weeks   Status On-going               Plan - 05/02/14 1031    Clinical Impression Statement Knee flexion ROM still limited to 82 degrees seated, 75 degrees supine despite extensive manual therapy and home dynamic splinting x3 days.  Knee extension in supine 2 degrees, quad lag  in sitting 10 degrees.  Quad motor control improving fair to good active quad set and SLR. Able to step up on 6 inch curb with right LE leading.   Able  to ambulate short distances without cane.  Limited flexion AROM has limited progress toward goals.     PT Next Visit Plan Await MD input regarding continuation of PT to address above deficits.          Problem List Patient Active Problem List   Diagnosis Date Noted  . Right knee pain 08/23/2013  . Left knee pain 06/02/2013  . Migraine 03/16/2012  . Obesity (BMI 30.0-34.9) 11/25/2011  . Allergic rhinitis 11/25/2011  . Primary localized osteoarthritis of right knee 11/25/2011  . History of viral pericarditis 11/25/2011    Alvera Singh 05/02/2014, 3:16 PM  [facility] [address] [place], [place], [address] Phone: [phone]   Fax:  [phone]   Ruben Im, PT 05/02/2014 3:16 PM Phone: [phone] Fax: [phone]

## 2014-05-04 ENCOUNTER — Ambulatory Visit: Payer: PRIVATE HEALTH INSURANCE | Admitting: Physical Therapy

## 2014-05-05 ENCOUNTER — Ambulatory Visit: Payer: PRIVATE HEALTH INSURANCE | Admitting: Physical Therapy

## 2014-05-09 ENCOUNTER — Encounter: Payer: Self-pay | Admitting: Physical Therapy

## 2014-05-12 ENCOUNTER — Ambulatory Visit: Payer: PRIVATE HEALTH INSURANCE | Admitting: Physical Therapy

## 2014-05-12 DIAGNOSIS — Z96651 Presence of right artificial knee joint: Secondary | ICD-10-CM | POA: Diagnosis not present

## 2014-05-12 DIAGNOSIS — R269 Unspecified abnormalities of gait and mobility: Secondary | ICD-10-CM

## 2014-05-12 DIAGNOSIS — M6281 Muscle weakness (generalized): Secondary | ICD-10-CM

## 2014-05-12 DIAGNOSIS — M25661 Stiffness of right knee, not elsewhere classified: Secondary | ICD-10-CM

## 2014-05-12 NOTE — Therapy (Signed)
[facility] [address] [place], [place], [address] Phone: [phone]   Fax:  [phone]  Physical Therapy Treatment/ReEvaluation  Patient Details  Name: Dawn [name] MRN: [id number] Date of Birth: 02/12/1966 Referring Provider:  Polite, Ronald, MD  Encounter Date: 05/12/2014      PT End of Session - 05/12/14 1026    Visit Number 15   Number of Visits 24   Date for PT Re-Evaluation 06/23/14   PT Start Time 0930   PT Stop Time 1043   PT Time Calculation (min) 73 min   Activity Tolerance Patient tolerated treatment well      Past Medical History  Diagnosis Date  . Acute meniscal tear of knee LEFT  . Seasonal allergies   . Environmental allergies   . History of gastric ulcer AS TEEN  . 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 Arthritis   . Asthma   . Contact lens/glasses fitting     wears contacts or glasses  . Primary localized osteoarthritis of right knee 11/25/2011    S/P Left Knee arthroscopy performed by Dr. Olin in April 2013.     Past Surgical History  Procedure Laterality Date  . Vaginal hysterectomy  03-18-1999  . Cholecystectomy  1992  . Appendectomy  1998  . Bilateral carpal tunnel release  1980'S  . Transthoracic echocardiogram  04-26-2009    NORMAL LVSF/ EF 60-65% / LVDF NORMAL / NO PERICARDIAL EFFUSION  . Knee arthroscopy  06/20/2011    Procedure: ARTHROSCOPY KNEE;  Surgeon: Matthew D Olin, MD;  Location: [facility];  Service: Orthopedics;  Laterality: Left;  left knee scope   latex allergy patient blisters and swells  . Breast reduction surgery Bilateral 12/21/2012    Procedure: BILATERAL MAMMARY REDUCTION  (BREAST);  Surgeon: David Bowers, MD;  Location: [facility];  Service: Plastics;  Laterality: Bilateral;  . Partial knee arthroplasty Right 03/03/2014    Procedure: RIGHT UNICOMPARTMENTAL KNEE  ARTHROPLASTY;  Surgeon: Joshua P Landau, MD;  Location: [facility];  Service: Orthopedics;  Laterality: Right;    There were no vitals filed for this visit.  Visit Diagnosis:  S/P right unicompartmental knee replacement - Plan: PT plan of care cert/re-cert  Knee stiffness, right - Plan: PT plan of care cert/re-cert  Abnormality of gait - Plan: PT plan of care cert/re-cert  Muscle weakness of lower extremity - Plan: PT plan of care cert/re-cert      Subjective Assessment - 05/12/14 0930    Symptoms Patient had knee manipulation yesterday.  Presents with SPC today.   Currently in Pain? Yes   Pain Score 3    Pain Location Knee   Pain Orientation Right   Aggravating Factors  bending knee            [facility] PT Assessment - 05/12/14 1013    AROM   Right Knee Extension 8   Right Knee Flexion 90  A-A supine 100 degrees                   [facility] Adult PT Treatment/Exercise - 05/12/14 0001    Knee/Hip Exercises: Stretches   Quad Stretch 60 seconds  sitting on green ball rocking and bouncing 1 minute each   Knee/Hip Exercises: Aerobic   Stationary Bike 5 min  full revolutions   Knee/Hip Exercises: Standing   Forward Step Up --  4   inch step up and overs 6x   Other Standing Knee Exercises WB on right with heel taps left 5x   Modalities   Modalities Cryotherapy   Cryotherapy   Number Minutes Cryotherapy 20 Minutes   Cryotherapy Location Knee   Type of Cryotherapy --  Game Ready vasocompression 32 deg, mod compression with elev   Manual Therapy   Manual Therapy Joint mobilization;Passive ROM   Joint Mobilization grade 2-3 knee A/P mobs for flexion/ext and patella mobs grades 2-3   Myofascial Release distal quad mobilization   Passive ROM contract relax for knee flexion multiple reps x 17 min   Other Manual Therapy Extension mobs grade 3/4 3x 30 sec; HS contract-relax                  PT Short Term Goals - 05/12/14 1039    PT SHORT TERM  GOAL #1   Title independent with HEP (04/25/14)   Status Achieved   PT SHORT TERM GOAL #2   Title improve R knee active ROM 5-75 for improved mobility (04/25/14)   Time 4   Period Weeks   Status Partially Met   PT SHORT TERM GOAL #3   Title ambulate > 150' with single point cane modified independent for improved mobility (04/25/14)   Status Achieved   PT SHORT TERM GOAL #4   Title report pain < 3/10 for improved function and decreased pain (04/25/14)   Time 4   Period Weeks   Status Partially Met           PT Long Term Goals - 05/12/14 1040    PT LONG TERM GOAL #1   Title independent with advanced HEP (05/23/14)   Time 6   Period Weeks   Status On-going   PT LONG TERM GOAL #2   Title improve R knee active ROM 0-110 for improved function and mobility (05/23/14)   Time 6   Period Weeks   Status On-going   PT LONG TERM GOAL #3   Title report ability to ambulate > 30 min without increase in pain without device (05/23/14)   Time 6   Period Weeks   Status On-going   PT LONG TERM GOAL #4   Title RLE circumferential measurement within 0.5 cm of LLE for decreased edema and improved function (05/23/14)   Time 6   Period Weeks   Status On-going               Plan - 05/12/14 1026    Clinical Impression Statement 1 day s/p right knee manipulation:  pre treatment 90 degrees seated; post treatment supine active-assisted 100 degrees.  Able to make a full revolution on the bike for the first time since her partial knee replacement.  Decreased quad control in newly gained knee flexion as evident with step ups and downs.    Recommended patient continue with her dynamic splint at home but she states that prior to    PT Next Visit Plan Continue 2-3x week for 4-6 weeks for continued knee flexion AROM and progressive strengthening        Problem List Patient Active Problem List   Diagnosis Date Noted  . Right knee pain 08/23/2013  . Left knee pain 06/02/2013  . Migraine 03/16/2012  .  Obesity (BMI 30.0-34.9) 11/25/2011  . Allergic rhinitis 11/25/2011  . Primary localized osteoarthritis of right knee 11/25/2011  . History of viral pericarditis 11/25/2011    Simpson, Stacy C 05/12/2014, 10:48 AM  [place]   [facility] [address] [place], [place], [address] Phone: [phone]   Fax:  [phone]   Ruben Im, PT 05/12/2014 10:48 AM Phone: [phone] Fax: [phone]

## 2014-05-16 ENCOUNTER — Ambulatory Visit: Payer: PRIVATE HEALTH INSURANCE | Admitting: Physical Therapy

## 2014-05-16 DIAGNOSIS — M25661 Stiffness of right knee, not elsewhere classified: Secondary | ICD-10-CM

## 2014-05-16 DIAGNOSIS — R269 Unspecified abnormalities of gait and mobility: Secondary | ICD-10-CM

## 2014-05-16 DIAGNOSIS — M6281 Muscle weakness (generalized): Secondary | ICD-10-CM

## 2014-05-16 DIAGNOSIS — Z96651 Presence of right artificial knee joint: Secondary | ICD-10-CM

## 2014-05-16 NOTE — Therapy (Signed)
[place], [place], [address] Phone: [phone]   Fax:  [phone]  Physical Therapy Treatment  Patient Details  Name: Dawn [name] MRN: [id number] Date of Birth: 08-16-1965 Referring Provider:  Seward Carol, MD  Encounter Date: 05/16/2014      PT End of Session - 05/16/14 1859    Visit Number 16   Number of Visits 24   Date for PT Re-Evaluation 06/23/14   PT Start Time 0940   PT Stop Time 1046   PT Time Calculation (min) 66 min   Activity Tolerance Patient tolerated treatment well      Past Medical History  Diagnosis Date  . Acute meniscal tear of knee LEFT  . Seasonal allergies   . Environmental allergies   . History of gastric ulcer AS TEEN  . 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 Arthritis   . Asthma   . Contact lens/glasses fitting     wears contacts or glasses  . Primary localized osteoarthritis of right knee 11/25/2011    S/P Left Knee arthroscopy performed by Dr. Alvan Dame in April 2013.     Past Surgical History  Procedure Laterality Date  . Vaginal hysterectomy  03-18-1999  . Cholecystectomy  1992  . Appendectomy  1998  . Bilateral carpal tunnel release  1980'S  . Transthoracic echocardiogram  04-26-2009    NORMAL LVSF/ EF 60-65% / LVDF NORMAL / NO PERICARDIAL EFFUSION  . Knee arthroscopy  06/20/2011    Procedure: ARTHROSCOPY KNEE;  Surgeon: Mauri Pole, MD;  Location: [facility];  Service: Orthopedics;  Laterality: Left;  left knee scope   latex allergy patient blisters and swells  . Breast reduction surgery Bilateral 12/21/2012    Procedure: BILATERAL MAMMARY REDUCTION  (BREAST);  Surgeon: Crissie Reese, MD;  Location: [place];  Service: Plastics;  Laterality: Bilateral;  . Partial knee arthroplasty Right 03/03/2014    Procedure: RIGHT UNICOMPARTMENTAL KNEE ARTHROPLASTY;   Surgeon: Johnny Bridge, MD;  Location: [facility];  Service: Orthopedics;  Laterality: Right;    There were no vitals filed for this visit.  Visit Diagnosis:  S/P right unicompartmental knee replacement  Knee stiffness, right  Abnormality of gait  Muscle weakness of lower extremity      Subjective Assessment - 05/16/14 0944    Symptoms Patient is 8 min late because she drove herself today.  Went to the gym and did 20# leg lift machine and HS pullbacks.  States she only take OTC medicine since she drove herself.   Currently in Pain? Yes   Pain Score 2    Pain Location Knee   Pain Orientation Right   Pain Type Surgical pain   Pain Onset More than a month ago   Pain Frequency Intermittent   Aggravating Factors  bending knee   Pain Relieving Factors cold/vasocompression            [facility] PT Assessment - 05/16/14 1859    AROM   Right Knee Extension 7   Right Knee Flexion 105                   [facility] Adult PT Treatment/Exercise - 05/16/14 1856    Knee/Hip Exercises: Aerobic   Stationary Bike 5 min  full revolutions   Knee/Hip Exercises: Machines for Strengthening   Cybex Leg Press 40# bilateral 20x; 20# right  only 20x seat 8   Knee/Hip Exercises: Standing   Lateral Step Up Right;10 reps;Hand Hold: 1;Step Height: 6"   Step Down Right;10 reps;Hand Hold: 1;Step Height: 2"   SLS with Vectors 4 ways WB on right 12x  red band resistance   Knee/Hip Exercises: Prone   Other Prone Exercises prone press ups 5x   Cryotherapy   Number Minutes Cryotherapy 20 Minutes   Cryotherapy Location Knee   Type of Cryotherapy --  Game Ready vasocompression mod compress 32 degrees   Manual Therapy   Manual Therapy Joint mobilization;Passive ROM   Joint Mobilization grade 2-3 knee A/P mobs for flexion/ext and patella mobs grades 2-3   Myofascial Release distal quad mobilization   Passive ROM hip flexor stretch in supine, sidelying 3x 20 sec each                   PT Short Term Goals - 05/16/14 1903    PT SHORT TERM GOAL #1   Title independent with HEP (04/25/14)   Status Achieved   PT SHORT TERM GOAL #2   Title improve R knee active ROM 5-75 for improved mobility (04/25/14)   Time 4   Period Weeks   Status Partially Met   PT SHORT TERM GOAL #3   Title ambulate > 150' with single point cane modified independent for improved mobility (04/25/14)   Status Achieved   PT SHORT TERM GOAL #4   Title report pain < 3/10 for improved function and decreased pain (04/25/14)   Time 4   Period Weeks   Status Partially Met           PT Long Term Goals - 05/16/14 1903    PT LONG TERM GOAL #1   Title independent with advanced HEP (05/23/14)   Time 6   Period Weeks   Status On-going   PT LONG TERM GOAL #2   Title improve R knee active ROM 0-110 for improved function and mobility (05/23/14)   Time 6   Period Weeks   Status On-going   PT LONG TERM GOAL #3   Title report ability to ambulate > 30 min without increase in pain without device (05/23/14)   Time 6   Period Weeks   Status On-going   PT LONG TERM GOAL #4   Title RLE circumferential measurement within 0.5 cm of LLE for decreased edema and improved function (05/23/14)   Time 6   Period Weeks   Status On-going               Plan - 05/16/14 1901    Clinical Impression Statement Patient with much improved active and passive knee flexion ROM.  Quad motor control improving as well but continues to lack eccentric strength to step down from a standard height step.  Ambulating short distances without her cane but needs for longer distances.  Verbal cues for patellofemoral alignment and to encourage terminal knee extension.     PT Next Visit Plan Continue 2-3x week for 4-6 weeks for continued knee flexion AROM and progressive strengthening        Problem List Patient Active Problem List   Diagnosis Date Noted  . Right knee pain 08/23/2013  . Left knee pain 06/02/2013   . Migraine 03/16/2012  . Obesity (BMI 30.0-34.9) 11/25/2011  . Allergic rhinitis 11/25/2011  . Primary localized osteoarthritis of right knee 11/25/2011  . History of viral pericarditis 11/25/2011    Ruben Im C 05/16/2014, 7:05 PM  [facility] [address] [place], [place], [address] Phone: [phone]   Fax:  [phone]   Ruben Im, PT 05/16/2014 7:05 PM Phone: [phone] Fax: [phone]

## 2014-05-17 ENCOUNTER — Ambulatory Visit: Payer: PRIVATE HEALTH INSURANCE | Admitting: Physical Therapy

## 2014-05-17 DIAGNOSIS — M25661 Stiffness of right knee, not elsewhere classified: Secondary | ICD-10-CM

## 2014-05-17 DIAGNOSIS — M6281 Muscle weakness (generalized): Secondary | ICD-10-CM

## 2014-05-17 DIAGNOSIS — Z96651 Presence of right artificial knee joint: Secondary | ICD-10-CM

## 2014-05-17 DIAGNOSIS — R269 Unspecified abnormalities of gait and mobility: Secondary | ICD-10-CM

## 2014-05-17 NOTE — Therapy (Signed)
[place], [place], [address] Phone: [phone]   Fax:  [phone]  Physical Therapy Treatment  Patient Details  Name: Dawn [name] MRN: [id number] Date of Birth: [birth year] Referring Provider:  Seward Carol, MD  Encounter Date: 05/17/2014      PT End of Session - 05/17/14 1102    Visit Number 17   Number of Visits 24   Date for PT Re-Evaluation 06/23/14   PT Start Time 1017   PT Stop Time 1115   PT Time Calculation (min) 58 min      Past Medical History  Diagnosis Date  . Acute meniscal tear of knee LEFT  . Seasonal allergies   . Environmental allergies   . History of gastric ulcer AS TEEN  . 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 Arthritis   . Asthma   . Contact lens/glasses fitting     wears contacts or glasses  . Primary localized osteoarthritis of right knee 11/25/2011    S/P Left Knee arthroscopy performed by Dr. Alvan Dame in April 2013.     Past Surgical History  Procedure Laterality Date  . Vaginal hysterectomy  03-18-1999  . Cholecystectomy  1992  . Appendectomy  1998  . Bilateral carpal tunnel release  1980'S  . Transthoracic echocardiogram  04-26-2009    NORMAL LVSF/ EF 60-65% / LVDF NORMAL / NO PERICARDIAL EFFUSION  . Knee arthroscopy  06/20/2011    Procedure: ARTHROSCOPY KNEE;  Surgeon: Mauri Pole, MD;  Location: [facility];  Service: Orthopedics;  Laterality: Left;  left knee scope   latex allergy patient blisters and swells  . Breast reduction surgery Bilateral 12/21/2012    Procedure: BILATERAL MAMMARY REDUCTION  (BREAST);  Surgeon: Crissie Reese, MD;  Location: [place];  Service: Plastics;  Laterality: Bilateral;  . Partial knee arthroplasty Right 03/03/2014    Procedure: RIGHT UNICOMPARTMENTAL KNEE ARTHROPLASTY;  Surgeon: Johnny Bridge, MD;  Location: [place];  Service: Orthopedics;  Laterality: Right;    There were no vitals filed for this visit.  Visit Diagnosis:  Muscle weakness of lower extremity  S/P right unicompartmental knee replacement  Knee stiffness, right  Abnormality of gait      Subjective Assessment - 05/17/14 1059    Symptoms I am tight this morning, more swelling.                      [place] Adult PT Treatment/Exercise - 05/17/14 1038    Knee/Hip Exercises: Stretches   Gastroc Stretch 60 seconds  edge of step   Knee/Hip Exercises: Aerobic   Stationary Bike Rec bike 5 min full revolulions then 5 min LEVEL !   Knee/Hip Exercises: Machines for Strengthening   Cybex Leg Press 40# bilateral 20x; 20# right only 20x seat 6, needed assit with LLE for concentric   Knee/Hip Exercises: Standing   Heel Raises 20 reps   Heel Raises Limitations edge of step   SLS 60  also 60 sec on foam pad without LOB   Rebounder 20 toss best on level surface   Knee/Hip Exercises: Seated   Long Arc Quad Right   Long Arc Quad Limitations quad lag present ~10 degrees, needs assist to attain full extension ROM and is unable to maintain   Knee/Hip Exercises: Supine   Straight Leg Raises 5 reps   Straight  Leg Raises Limitations increased medial knee pain and ~10 degress quad lag   Modalities   Modalities Cryotherapy   Cryotherapy   Number Minutes Cryotherapy 20 Minutes   Cryotherapy Location Knee   Type of Cryotherapy --  32 degrees mod pressuree   Manual Therapy   Manual Therapy Massage   Massage Soft tissue work distal quads, pt reports decreased pain.                   PT Short Term Goals - 05/16/14 1903    PT SHORT TERM GOAL #1   Title independent with HEP (04/25/14)   Status Achieved   PT SHORT TERM GOAL #2   Title improve R knee active ROM 5-75 for improved mobility (04/25/14)   Time 4   Period Weeks   Status Partially Met   PT SHORT TERM GOAL #3   Title ambulate > 150' with single point  cane modified independent for improved mobility (04/25/14)   Status Achieved   PT SHORT TERM GOAL #4   Title report pain < 3/10 for improved function and decreased pain (04/25/14)   Time 4   Period Weeks   Status Partially Met           PT Long Term Goals - 05/16/14 1903    PT LONG TERM GOAL #1   Title independent with advanced HEP (05/23/14)   Time 6   Period Weeks   Status On-going   PT LONG TERM GOAL #2   Title improve R knee active ROM 0-110 for improved function and mobility (05/23/14)   Time 6   Period Weeks   Status On-going   PT LONG TERM GOAL #3   Title report ability to ambulate > 30 min without increase in pain without device (05/23/14)   Time 6   Period Weeks   Status On-going   PT LONG TERM GOAL #4   Title RLE circumferential measurement within 0.5 cm of LLE for decreased edema and improved function (05/23/14)   Time 6   Period Weeks   Status On-going               Plan - 05/17/14 1103    Clinical Impression Statement Pt demonstrates quad lag today and complains of increased tightness and swelling. She was able to perform SLS of 60 seconds on foam pad and perform SLS plotoss without LOB 20 reps.    PT Next Visit Plan knee flexion AROM and progessive strengthening, may try VMS if still exhibits quad lag        Problem List Patient Active Problem List   Diagnosis Date Noted  . Right knee pain 08/23/2013  . Left knee pain 06/02/2013  . Migraine 03/16/2012  . Obesity (BMI 30.0-34.9) 11/25/2011  . Allergic rhinitis 11/25/2011  . Primary localized osteoarthritis of right knee 11/25/2011  . History of viral pericarditis 11/25/2011    Dorene Ar, [place] 05/17/2014, 11:08 AM  [facility] [address] [place], [place], [address] Phone: [phone]   Fax:  [phone]

## 2014-05-18 ENCOUNTER — Ambulatory Visit: Payer: PRIVATE HEALTH INSURANCE | Admitting: Physical Therapy

## 2014-05-18 DIAGNOSIS — R269 Unspecified abnormalities of gait and mobility: Secondary | ICD-10-CM

## 2014-05-18 DIAGNOSIS — Z96651 Presence of right artificial knee joint: Secondary | ICD-10-CM | POA: Diagnosis not present

## 2014-05-18 DIAGNOSIS — M6281 Muscle weakness (generalized): Secondary | ICD-10-CM

## 2014-05-18 DIAGNOSIS — M25661 Stiffness of right knee, not elsewhere classified: Secondary | ICD-10-CM

## 2014-05-18 NOTE — Patient Instructions (Addendum)
Perform Terminal Knee extension with green band in standing x 10 Right foot forward, back and in parallel. See Handout.

## 2014-05-18 NOTE — Therapy (Addendum)
[place], [place], [address] Phone: [phone]   Fax:  [phone]  Physical Therapy Treatment  Patient Details  Name: Dawn [name] MRN: [id number] Date of Birth: 03-06-1965 Referring Provider:  Seward Carol, MD  Encounter Date: 05/18/2014      PT End of Session - 05/18/14 1027    Visit Number 18   Number of Visits 24   Date for PT Re-Evaluation 06/23/14   PT Start Time 1017   PT Stop Time 1120   PT Time Calculation (min) 63 min      Past Medical History  Diagnosis Date  . Acute meniscal tear of knee LEFT  . Seasonal allergies   . Environmental allergies   . History of gastric ulcer AS TEEN  . 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 Arthritis   . Asthma   . Contact lens/glasses fitting     wears contacts or glasses  . Primary localized osteoarthritis of right knee 11/25/2011    S/P Left Knee arthroscopy performed by Dr. Alvan Dame in April 2013.     Past Surgical History  Procedure Laterality Date  . Vaginal hysterectomy  03-18-1999  . Cholecystectomy  1992  . Appendectomy  1998  . Bilateral carpal tunnel release  1980'S  . Transthoracic echocardiogram  04-26-2009    NORMAL LVSF/ EF 60-65% / LVDF NORMAL / NO PERICARDIAL EFFUSION  . Knee arthroscopy  06/20/2011    Procedure: ARTHROSCOPY KNEE;  Surgeon: Mauri Pole, MD;  Location: [facility];  Service: Orthopedics;  Laterality: Left;  left knee scope   latex allergy patient blisters and swells  . Breast reduction surgery Bilateral 12/21/2012    Procedure: BILATERAL MAMMARY REDUCTION  (BREAST);  Surgeon: Crissie Reese, MD;  Location: [place];  Service: Plastics;  Laterality: Bilateral;  . Partial knee arthroplasty Right 03/03/2014    Procedure: RIGHT UNICOMPARTMENTAL KNEE ARTHROPLASTY;  Surgeon: Johnny Bridge, MD;  Location: [place];  Service: Orthopedics;  Laterality: Right;    There were no vitals filed for this visit.  Visit Diagnosis:  Knee stiffness, right  S/P right unicompartmental knee replacement  Abnormality of gait  Muscle weakness of lower extremity      Subjective Assessment - 05/18/14 1026    Symptoms Much better today, 2/10   Currently in Pain? Yes   Pain Descriptors / Indicators Aching;Burning   Aggravating Factors  flexing knee   Pain Relieving Factors cold/ compression, rest            [facility] PT Assessment - 05/18/14 1047    AROM   Right Knee Extension 8  supine, -20 quad lag   Right Knee Flexion 98  105 best   PROM   PROM Assessment Site Knee   Right/Left Knee Right  5-105   Strength   Right Knee Flexion 4/5   Right Knee Extension 3-/5  -22 quad lag                   [facility] Adult PT Treatment/Exercise - 05/18/14 1057    Knee/Hip Exercises: Aerobic   Stationary Bike Nustep Level 4 LE only    Knee/Hip Exercises: Standing   SLS 60  also 60 sec on foam pad without LOB, trial with bent knee   Rebounder 20 toss best on level surface  unable to perform with bent knee  Other Standing Knee Exercises Terminal knee extension with green band x 10 parallel, x 10 with right foot forward, x 10 with right foot back and 5 second holds.    Knee/Hip Exercises: Seated   Long Arc Quad Right;10 reps   Long [employer] Limitations needs assist for full ROM   Knee/Hip Exercises: Supine   Short Arc Quad Sets Right;10 reps   Short Arc Quad Sets Limitations needs assist for full extension   Straight Leg Raises 15 reps   Straight Leg Raises Limitations -20 quad lag   Modalities   Modalities Cryotherapy   Cryotherapy   Number Minutes Cryotherapy 20 Minutes   Cryotherapy Location Knee   Type of Cryotherapy --  32 degrees moderate pressure vaso                 PT Education - 05/18/14 1114    Education provided Yes   Education Details Knee control standing  with green band( given from drawer) for TKE x 10 each   Person(s) Educated Patient   Methods Explanation;Handout   Comprehension Verbalized understanding          PT Short Term Goals - 05/18/14 1028    PT SHORT TERM GOAL #1   Title independent with HEP (04/25/14)   Time 4   Period Weeks   Status Achieved   PT SHORT TERM GOAL #2   Title improve R knee active ROM 5-75 for improved mobility (04/25/14)   Time 4   Period Weeks   Status Partially Met   PT SHORT TERM GOAL #3   Title ambulate > 150' with single point cane modified independent for improved mobility (04/25/14)   Time 4   Period Weeks   Status Achieved   PT SHORT TERM GOAL #4   Title report pain < 3/10 for improved function and decreased pain (04/25/14)   Time 4   Period Weeks   Status Achieved           PT Long Term Goals - 05/16/14 1903    PT LONG TERM GOAL #1   Title independent with advanced HEP (05/23/14)   Time 6   Period Weeks   Status On-going   PT LONG TERM GOAL #2   Title improve R knee active ROM 0-110 for improved function and mobility (05/23/14)   Time 6   Period Weeks   Status On-going   PT LONG TERM GOAL #3   Title report ability to ambulate > 30 min without increase in pain without device (05/23/14)   Time 6   Period Weeks   Status On-going   PT LONG TERM GOAL #4   Title RLE circumferential measurement within 0.5 cm of LLE for decreased edema and improved function (05/23/14)   Time 6   Period Weeks   Status On-going               Plan - 05/18/14 1029    Clinical Impression Statement Pt reports ambulating without AD 20 minutes prior to increased pain. She demonstrates a -20 degree extension laq with SLR and Long arc quad exercises.    PT Next Visit Plan [place]. VMS stimulation for quad strengthening        Problem List Patient Active Problem List   Diagnosis Date Noted  . Right knee pain 08/23/2013  . Left knee pain 06/02/2013  . Migraine 03/16/2012  . Obesity (BMI 30.0-34.9)  11/25/2011  . Allergic rhinitis 11/25/2011  . Primary localized osteoarthritis of right knee  11/25/2011  . History of viral pericarditis 11/25/2011    Dorene Ar, [place] 05/18/2014, 11:14 AM  [facility] [address] [place], [place], [address] Phone: [phone]   Fax:  [phone]

## 2014-05-19 ENCOUNTER — Encounter: Payer: Self-pay | Admitting: Physical Therapy

## 2014-05-22 ENCOUNTER — Ambulatory Visit: Payer: PRIVATE HEALTH INSURANCE | Admitting: Physical Therapy

## 2014-05-22 DIAGNOSIS — M25661 Stiffness of right knee, not elsewhere classified: Secondary | ICD-10-CM

## 2014-05-22 DIAGNOSIS — M6281 Muscle weakness (generalized): Secondary | ICD-10-CM

## 2014-05-22 DIAGNOSIS — Z96651 Presence of right artificial knee joint: Secondary | ICD-10-CM | POA: Diagnosis not present

## 2014-05-22 NOTE — Patient Instructions (Signed)
Take her pain meds

## 2014-05-22 NOTE — Therapy (Signed)
[place], [place], [address] Phone: [phone]   Fax:  [phone]  Physical Therapy Evaluation  Patient Details  Name: Dawn [name] MRN: [id number] Date of Birth: Jun 10, 1965 Referring Provider:  Seward Carol, MD  Encounter Date: 05/22/2014      PT End of Session - 05/22/14 0929    Visit Number 19   Number of Visits 24   Date for PT Re-Evaluation 06/23/14   PT Start Time 0807   PT Stop Time 0914   PT Time Calculation (min) 67 min   Activity Tolerance Patient limited by pain      Past Medical History  Diagnosis Date  . Acute meniscal tear of knee LEFT  . Seasonal allergies   . Environmental allergies   . History of gastric ulcer AS TEEN  . 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 Arthritis   . Asthma   . Contact lens/glasses fitting     wears contacts or glasses  . Primary localized osteoarthritis of right knee 11/25/2011    S/P Left Knee arthroscopy performed by Dr. Alvan Dame in April 2013.     Past Surgical History  Procedure Laterality Date  . Vaginal hysterectomy  03-18-1999  . Cholecystectomy  1992  . Appendectomy  1998  . Bilateral carpal tunnel release  1980'S  . Transthoracic echocardiogram  04-26-2009    NORMAL LVSF/ EF 60-65% / LVDF NORMAL / NO PERICARDIAL EFFUSION  . Knee arthroscopy  06/20/2011    Procedure: ARTHROSCOPY KNEE;  Surgeon: Mauri Pole, MD;  Location: [facility];  Service: Orthopedics;  Laterality: Left;  left knee scope   latex allergy patient blisters and swells  . Breast reduction surgery Bilateral 12/21/2012    Procedure: BILATERAL MAMMARY REDUCTION  (BREAST);  Surgeon: Crissie Reese, MD;  Location: [place];  Service: Plastics;  Laterality: Bilateral;  . Partial knee arthroplasty Right 03/03/2014    Procedure: RIGHT UNICOMPARTMENTAL KNEE ARTHROPLASTY;  Surgeon:  Johnny Bridge, MD;  Location: [place];  Service: Orthopedics;  Laterality: Right;    There were no vitals filed for this visit.  Visit Diagnosis:  Knee stiffness, right  Muscle weakness of lower extremity      Subjective Assessment - 05/22/14 0827    Symptoms Lateral knee feels strained.  Stiff in am.  No longer takes pain Meds.  Uses Aleve.   Currently in Pain? No/denies   Pain Orientation Right   Pain Descriptors / Indicators Burning  stiff   Pain Frequency Intermittent   Aggravating Factors  Walking long period of time.   Pain Relieving Factors Aleve, Cold, rest   Effect of Pain on Daily Activities decreased activity.                       [facility] Adult PT Treatment/Exercise - 05/22/14 0815    Knee/Hip Exercises: Stretches   Quad Stretch --  standing step stretch   Knee/Hip Exercises: Aerobic   Stationary Bike bike  3 minutes then got stiff   Knee/Hip Exercises: Supine   Quad Sets --  7 reps 10/50 with IFC   Heel Slides 10 reps  painful   Modalities   Modalities Cryotherapy   Cryotherapy   Number Minutes Cryotherapy 20 Minutes   Cryotherapy Location Knee   Type of Cryotherapy --  game ready.   Printmaker  Electrical Stimulation Location Knee   Electrical Stimulation Action Pain  RE ed   Electrical Stimulation Parameters to tolerance   Electrical Stimulation Goals --  pain, strength   Manual Therapy   Manual Therapy --  retrograde, strap mobilization   Edema Management --  retrograde,    Passive ROM not tolerated well                  PT Short Term Goals - 05/18/14 1028    PT SHORT TERM GOAL #1   Title independent with HEP (04/25/14)   Time 4   Period Weeks   Status Achieved   PT SHORT TERM GOAL #2   Title improve R knee active ROM 5-75 for improved mobility (04/25/14)   Time 4   Period Weeks   Status Partially Met   PT SHORT TERM GOAL #3   Title ambulate > 150' with single point cane modified  independent for improved mobility (04/25/14)   Time 4   Period Weeks   Status Achieved   PT SHORT TERM GOAL #4   Title report pain < 3/10 for improved function and decreased pain (04/25/14)   Time 4   Period Weeks   Status Achieved           PT Long Term Goals - 05/16/14 1903    PT LONG TERM GOAL #1   Title independent with advanced HEP (05/23/14)   Time 6   Period Weeks   Status On-going   PT LONG TERM GOAL #2   Title improve R knee active ROM 0-110 for improved function and mobility (05/23/14)   Time 6   Period Weeks   Status On-going   PT LONG TERM GOAL #3   Title report ability to ambulate > 30 min without increase in pain without device (05/23/14)   Time 6   Period Weeks   Status On-going   PT LONG TERM GOAL #4   Title RLE circumferential measurement within 0.5 cm of LLE for decreased edema and improved function (05/23/14)   Time 6   Period Weeks   Status On-going               Plan - 05/22/14 0930    Clinical Impression Statement 7o prior to PT.  87 post. degrees flexion   PT Next Visit Plan Pre med for pain.  Range and strengthen   Consulted and Agree with Plan of Care Patient         Problem List Patient Active Problem List   Diagnosis Date Noted  . Right knee pain 08/23/2013  . Left knee pain 06/02/2013  . Migraine 03/16/2012  . Obesity (BMI 30.0-34.9) 11/25/2011  . Allergic rhinitis 11/25/2011  . Primary localized osteoarthritis of right knee 11/25/2011  . History of viral pericarditis 11/25/2011   Melvenia Needles, PTA 05/22/2014 9:39 AM Phone: [phone] Fax: [phone]  [facility] 05/22/2014, 9:39 AM  [facility] [address] [place], [place], [address] Phone: [phone]   Fax:  [phone]

## 2014-05-24 ENCOUNTER — Ambulatory Visit: Payer: PRIVATE HEALTH INSURANCE | Admitting: Physical Therapy

## 2014-05-24 DIAGNOSIS — Z96651 Presence of right artificial knee joint: Secondary | ICD-10-CM | POA: Diagnosis not present

## 2014-05-24 DIAGNOSIS — M25661 Stiffness of right knee, not elsewhere classified: Secondary | ICD-10-CM

## 2014-05-24 DIAGNOSIS — M6281 Muscle weakness (generalized): Secondary | ICD-10-CM

## 2014-05-24 NOTE — Therapy (Signed)
[place], [place], [address] Phone: [phone]   Fax:  [phone]  Physical Therapy Treatment  Patient Details  Name: Dawn [name] MRN: [id number] Date of Birth: May 28, 1965 Referring Provider:  Seward Carol, MD  Encounter Date: 05/24/2014      PT End of Session - 05/24/14 1549    Visit Number 20   Number of Visits 24   Date for PT Re-Evaluation 06/23/14   PT Start Time 1330   PT Stop Time 1430   PT Time Calculation (min) 60 min   Activity Tolerance Patient tolerated treatment well      Past Medical History  Diagnosis Date  . Acute meniscal tear of knee LEFT  . Seasonal allergies   . Environmental allergies   . History of gastric ulcer AS TEEN  . 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 Arthritis   . Asthma   . Contact lens/glasses fitting     wears contacts or glasses  . Primary localized osteoarthritis of right knee 11/25/2011    S/P Left Knee arthroscopy performed by Dr. Alvan Dame in April 2013.     Past Surgical History  Procedure Laterality Date  . Vaginal hysterectomy  03-18-1999  . Cholecystectomy  1992  . Appendectomy  1998  . Bilateral carpal tunnel release  1980'S  . Transthoracic echocardiogram  04-26-2009    NORMAL LVSF/ EF 60-65% / LVDF NORMAL / NO PERICARDIAL EFFUSION  . Knee arthroscopy  06/20/2011    Procedure: ARTHROSCOPY KNEE;  Surgeon: Mauri Pole, MD;  Location: [facility];  Service: Orthopedics;  Laterality: Left;  left knee scope   latex allergy patient blisters and swells  . Breast reduction surgery Bilateral 12/21/2012    Procedure: BILATERAL MAMMARY REDUCTION  (BREAST);  Surgeon: Crissie Reese, MD;  Location: [place];  Service: Plastics;  Laterality: Bilateral;  . Partial knee arthroplasty Right 03/03/2014    Procedure: RIGHT UNICOMPARTMENTAL KNEE ARTHROPLASTY;   Surgeon: Johnny Bridge, MD;  Location: [place];  Service: Orthopedics;  Laterality: Right;    There were no vitals filed for this visit.  Visit Diagnosis:  Knee stiffness, right  Muscle weakness of lower extremity      Subjective Assessment - 05/24/14 1352    Symptoms No pain premedicated.  Sleeping better                       [facility] Adult PT Treatment/Exercise - 05/24/14 1335    Knee/Hip Exercises: Standing   Lateral Step Up 10 reps   Forward Step Up 10 reps;Step Height: 6"   Knee/Hip Exercises: Seated   Other Seated Knee Exercises sit to stand 10 reps   Knee/Hip Exercises: Supine   Heel Slides 10 reps  2 sets Over pressure   Terminal Knee Extension 10 reps  prone   Knee/Hip Exercises: Prone   Hamstring Curl 10 reps  overpressure   Straight Leg Raises 10 reps   Cryotherapy   Number Minutes Cryotherapy 15 Minutes   Cryotherapy Location Knee   Type of Cryotherapy --  game ready   Manual Therapy   Manual Therapy Edema management;Joint mobilization   Joint Mobilization strap for tibia wiith knee flexion, supine                  PT Short Term Goals - 05/18/14 1028  PT SHORT TERM GOAL #1   Title independent with HEP (04/25/14)   Time 4   Period Weeks   Status Achieved   PT SHORT TERM GOAL #2   Title improve R knee active ROM 5-75 for improved mobility (04/25/14)   Time 4   Period Weeks   Status Partially Met   PT SHORT TERM GOAL #3   Title ambulate > 150' with single point cane modified independent for improved mobility (04/25/14)   Time 4   Period Weeks   Status Achieved   PT SHORT TERM GOAL #4   Title report pain < 3/10 for improved function and decreased pain (04/25/14)   Time 4   Period Weeks   Status Achieved           PT Long Term Goals - 05/24/14 1553    PT LONG TERM GOAL #1   Title independent with advanced HEP (05/23/14)   Time 6   Period Weeks   Status On-going   PT LONG TERM GOAL #2   Title improve  R knee active ROM 0-110 for improved function and mobility (05/23/14)   Time 6   Period Weeks   Status On-going   PT LONG TERM GOAL #3   Title report ability to ambulate > 30 min without increase in pain without device (05/23/14)   Baseline 30 minutes some pain   Time 6   Period Weeks   Status On-going   PT LONG TERM GOAL #4   Title RLE circumferential measurement within 0.5 cm of LLE for decreased edema and improved function (05/23/14)   Time 6   Period Weeks   Status Unable to assess               Plan - 05/24/14 1550    Clinical Impression Statement premedication helped alot.  edema improving.     PT Next Visit Plan progress toward more closed chain   Consulted and Agree with Plan of Care Patient        Problem List Patient Active Problem List   Diagnosis Date Noted  . Right knee pain 08/23/2013  . Left knee pain 06/02/2013  . Migraine 03/16/2012  . Obesity (BMI 30.0-34.9) 11/25/2011  . Allergic rhinitis 11/25/2011  . Primary localized osteoarthritis of right knee 11/25/2011  . History of viral pericarditis 11/25/2011   Melvenia Needles, PTA 05/24/2014 3:56 PM Phone: [phone] Fax: [phone]   [facility] 05/24/2014, 3:56 PM  [facility] [address] [place], [place], [address] Phone: [phone]   Fax:  [phone]

## 2014-05-26 ENCOUNTER — Ambulatory Visit: Payer: PRIVATE HEALTH INSURANCE | Attending: Internal Medicine | Admitting: Physical Therapy

## 2014-05-26 DIAGNOSIS — M6281 Muscle weakness (generalized): Secondary | ICD-10-CM | POA: Diagnosis not present

## 2014-05-26 DIAGNOSIS — R269 Unspecified abnormalities of gait and mobility: Secondary | ICD-10-CM | POA: Diagnosis not present

## 2014-05-26 DIAGNOSIS — M25661 Stiffness of right knee, not elsewhere classified: Secondary | ICD-10-CM | POA: Diagnosis not present

## 2014-05-26 DIAGNOSIS — Z96651 Presence of right artificial knee joint: Secondary | ICD-10-CM | POA: Insufficient documentation

## 2014-05-26 NOTE — Therapy (Signed)
[place], [place], [address] Phone: [phone]   Fax:  [phone]  Physical Therapy Treatment  Patient Details  Name: Dawn [name] MRN: [id number] Date of Birth: Dec 20, 1965 Referring Provider:  Seward Carol, MD  Encounter Date: 05/26/2014      PT End of Session - 05/26/14 1026    Visit Number 21   Number of Visits 24   Date for PT Re-Evaluation 06/23/14   PT Start Time 0930   PT Stop Time 1042   PT Time Calculation (min) 72 min   Activity Tolerance Patient tolerated treatment well      Past Medical History  Diagnosis Date  . Acute meniscal tear of knee LEFT  . Seasonal allergies   . Environmental allergies   . History of gastric ulcer AS TEEN  . 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 Arthritis   . Asthma   . Contact lens/glasses fitting     wears contacts or glasses  . Primary localized osteoarthritis of right knee 11/25/2011    S/P Left Knee arthroscopy performed by Dr. Alvan Dame in April 2013.     Past Surgical History  Procedure Laterality Date  . Vaginal hysterectomy  03-18-1999  . Cholecystectomy  1992  . Appendectomy  1998  . Bilateral carpal tunnel release  1980'S  . Transthoracic echocardiogram  04-26-2009    NORMAL LVSF/ EF 60-65% / LVDF NORMAL / NO PERICARDIAL EFFUSION  . Knee arthroscopy  06/20/2011    Procedure: ARTHROSCOPY KNEE;  Surgeon: Mauri Pole, MD;  Location: [facility];  Service: Orthopedics;  Laterality: Left;  left knee scope   latex allergy patient blisters and swells  . Breast reduction surgery Bilateral 12/21/2012    Procedure: BILATERAL MAMMARY REDUCTION  (BREAST);  Surgeon: Crissie Reese, MD;  Location: [place];  Service: Plastics;  Laterality: Bilateral;  . Partial knee arthroplasty Right 03/03/2014    Procedure: RIGHT UNICOMPARTMENTAL KNEE ARTHROPLASTY;   Surgeon: Johnny Bridge, MD;  Location: [place];  Service: Orthopedics;  Laterality: Right;    There were no vitals filed for this visit.  Visit Diagnosis:  Knee stiffness, right  Muscle weakness of lower extremity  S/P right unicompartmental knee replacement  Abnormality of gait      Subjective Assessment - 05/26/14 0936    Symptoms No pain just stiffness.  Took pain medicine this morning.  I pulled my HS at the gym last week.  Walking without a cane primarily.  Can walk around the park 15-20 min without the cane.  Can walk much longer with the cane.     Currently in Pain? No/denies   Pain Score 0-No pain  just stiff   Pain Location Knee   Pain Orientation Right   Aggravating Factors  will swell with walking too long.            [name] - 05/26/14 1023    AROM   Right Knee Extension 8   Right Knee Flexion 101  after manual therapy   other   Comments superior pole of patella right 46cm (left 44.5)                   [facility] Adult PT Treatment/Exercise - 05/26/14 1003    Lumbar Exercises: Aerobic   Stationary Bike 6  full revolutions   Knee/Hip Exercises: Machines for Strengthening  Cybex Leg Press right only 20# 30 x seat 8   Knee/Hip Exercises: Standing   Step Down 15 reps;Left;Hand Hold: 1;Step Height: 2"   Other Standing Knee Exercises Up and down steps 3 times reciprocally with 1 rail   Other Standing Knee Exercises knee flex stretch on right on stairs 1 min;right hip flexor stretch 1 minute   Knee/Hip Exercises: Seated   Long Arc Quad Right;2 sets;Strengthening;10 reps;Weights   Long Arc Quad Weight 3 lbs.   Other Seated Knee Exercises red band HS curls 20x   Other Seated Knee Exercises seated terminal knee extensions 2x10   Cryotherapy   Number Minutes Cryotherapy 15 Minutes   Cryotherapy Location Knee   Type of Cryotherapy --  Game Ready moderate in supine with elevation   Manual Therapy   Joint Mobilization  knee flex mobs in sitting and supine grade 3/4 10x ea   Myofascial Release distal quad mobilization   Other Manual Therapy Extension mobs grade 3/4 3x 30 sec; HS contract-relax                  PT Short Term Goals - 05/26/14 1031    PT SHORT TERM GOAL #1   Title independent with HEP (04/25/14)   Status Achieved   PT SHORT TERM GOAL #2   Title improve R knee active ROM 5-75 for improved mobility (04/25/14)   Status Partially Met   PT SHORT TERM GOAL #3   Title ambulate > 150' with single point cane modified independent for improved mobility (04/25/14)   Status Achieved   PT SHORT TERM GOAL #4   Title report pain < 3/10 for improved function and decreased pain (04/25/14)   Status Achieved           PT Long Term Goals - 05/26/14 1031    PT LONG TERM GOAL #1   Title independent with advanced HEP (05/23/14)   Time 6   Period Weeks   Status On-going   PT LONG TERM GOAL #2   Title improve R knee active ROM 0-110 for improved function and mobility (05/23/14)   Time 6   Period Weeks   Status On-going   PT LONG TERM GOAL #3   Title report ability to ambulate > 30 min without increase in pain without device (05/23/14)   Time 6   Period Weeks   Status Partially Met   PT LONG TERM GOAL #4   Title RLE circumferential measurement within 0.5 cm of LLE for decreased edema and improved function (05/23/14)   Baseline 1.5 cm difference on 05/26/14   Time 6   Period Weeks   Status Partially Met               Plan - 05/26/14 1028    Clinical Impression Statement Quad weakness with terminal knee extension persists although overall strength improving.  Able to go up and down steps reciprocally with 1 railing.  Minimal improvement in knee flexion ROM.  Swelling decreasing.  Decreased limp.  Verbal cueing to achieve full knee extension.     PT Next Visit Plan Continue 3x next week then will request additional visits from [facility] with a decrease treatment frequency to 2x/week.   Continue knee flexion ROM and quad control.          Problem List Patient Active Problem List   Diagnosis Date Noted  . Right knee pain 08/23/2013  . Left knee pain 06/02/2013  . Migraine 03/16/2012  . Obesity (BMI  30.0-34.9) 11/25/2011  . Allergic rhinitis 11/25/2011  . Primary localized osteoarthritis of right knee 11/25/2011  . History of viral pericarditis 11/25/2011    Alvera Singh 05/26/2014, 10:35 AM  [facility] [address] [place], [place], [address] Phone: [phone]   Fax:  [phone]  Ruben Im, PT 05/26/2014 10:36 AM Phone: [phone] Fax: [phone]

## 2014-05-29 ENCOUNTER — Ambulatory Visit: Payer: PRIVATE HEALTH INSURANCE | Admitting: Physical Therapy

## 2014-05-29 DIAGNOSIS — M25661 Stiffness of right knee, not elsewhere classified: Secondary | ICD-10-CM

## 2014-05-29 DIAGNOSIS — Z96651 Presence of right artificial knee joint: Secondary | ICD-10-CM | POA: Diagnosis not present

## 2014-05-29 DIAGNOSIS — M6281 Muscle weakness (generalized): Secondary | ICD-10-CM

## 2014-05-29 NOTE — Therapy (Signed)
[facility] [address] [place], [place], [address] Phone: [phone]   Fax:  [phone]  Physical Therapy Treatment  Patient Details  Name: Dawn SchmidConstance M [name] MRN: [id number] Date of Birth: 02/16/1966 Referring Provider:  Renford DillsPolite, Ronald, MD  Encounter Date: 05/29/2014      PT End of Session - 05/29/14 1339    Visit Number 22   Number of Visits 24   Date for PT Re-Evaluation 06/23/14   PT Start Time 0845   PT Stop Time 0945   PT Time Calculation (min) 60 min   Activity Tolerance Patient tolerated treatment well      Past Medical History  Diagnosis Date  . Acute meniscal tear of knee LEFT  . Seasonal allergies   . Environmental allergies   . History of gastric ulcer AS TEEN  . 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 Arthritis   . Asthma   . Contact lens/glasses fitting     wears contacts or glasses  . Primary localized osteoarthritis of right knee 11/25/2011    S/P Left Knee arthroscopy performed by Dr. Charlann Boxerlin in April 2013.     Past Surgical History  Procedure Laterality Date  . Vaginal hysterectomy  03-18-1999  . Cholecystectomy  1992  . Appendectomy  1998  . Bilateral carpal tunnel release  1980'S  . Transthoracic echocardiogram  04-26-2009    NORMAL LVSF/ EF 60-65% / LVDF NORMAL / NO PERICARDIAL EFFUSION  . Knee arthroscopy  06/20/2011    Procedure: ARTHROSCOPY KNEE;  Surgeon: Shelda PalMatthew D Olin, MD;  [facility];  Service: Orthopedics;  Laterality: Left;  left knee scope   latex allergy patient blisters and swells  . Breast reduction surgery Bilateral 12/21/2012    Procedure: BILATERAL MAMMARY REDUCTION  (BREAST);  Surgeon: Etter Sjogrenavid Bowers, MD;  Location: [facility];  Service: Plastics;  Laterality: Bilateral;  . Partial knee arthroplasty Right 03/03/2014    Procedure: RIGHT UNICOMPARTMENTAL KNEE ARTHROPLASTY;   Surgeon: Eulas PostJoshua P Landau, MD;  Location: [facility];  Service: Orthopedics;  Laterality: Right;    There were no vitals filed for this visit.  Visit Diagnosis:  Knee stiffness, right  Muscle weakness of lower extremity      Subjective Assessment - 05/29/14 0842    Subjective Feels swelling , no pain.  Flexion staying longer.  Doing her home exercises.     Currently in Pain? No/denies   Pain Orientation Right;Medial   Pain Descriptors / Indicators --  pulling   Pain Frequency Intermittent   Aggravating Factors  exercising.   Pain Relieving Factors meds, cold                       [facility] Adult PT Treatment/Exercise - 05/29/14 0917    Knee/Hip Exercises: Stretches   Gastroc Stretch 30 seconds   Knee/Hip Exercises: Machines for Strengthening   Cybex Leg Press --  20,40 LBS 2 sets each of 1,2 legs 10 reps/.   Knee/Hip Exercises: Standing   Theraband Level (Terminal Knee Extension) Level 2 (Red)  3 positions, 10 reps each   Forward Step Up 20 reps;Step Height: 6"   Wall Squat 10 reps;10 seconds   Gait Training Less limp   Knee/Hip Exercises: Supine   Quad Sets 1 set   Terminal Knee Extension --  10 reps, 3 positions, added to home prpgram  Knee/Hip Exercises: Prone   Hamstring Curl 10 reps  AA   Cryotherapy   Number Minutes Cryotherapy 20 Minutes   Cryotherapy Location Knee   Type of Cryotherapy --  Vasopneumatic,moderate pressure, 32 degrees. Rt   Manual Therapy   Edema Management  3" below patella 37.5 cm. bottom patella 41 cm, top patella 45cm, 3" above patella 53.5 cm   Passive ROM --  contract relax , strap mobilization 105 PROM                  PT Short Term Goals - 05/29/14 1343    PT SHORT TERM GOAL #1   Title independent with HEP (04/25/14)   Time 4   Period Weeks   Status Achieved   PT SHORT TERM GOAL #2   Title improve R knee active ROM 5-75 for improved mobility (04/25/14)   Time 4   Period Weeks   Status  Achieved   PT SHORT TERM GOAL #3   Title ambulate > 150' with single point cane modified independent for improved mobility (04/25/14)   Time 4   Status Achieved   PT SHORT TERM GOAL #4   Title report pain < 3/10 for improved function and decreased pain (04/25/14)   Time 4   Period Weeks   Status Achieved           PT Long Term Goals - 05/29/14 1344    PT LONG TERM GOAL #1   Title independent with advanced HEP (05/23/14)   Time 6   Period Weeks   Status On-going   PT LONG TERM GOAL #2   Title improve R knee active ROM 0-110 for improved function and mobility (05/23/14)   Time 6   Period Weeks   Status On-going   PT LONG TERM GOAL #3   Title report ability to ambulate > 30 min without increase in pain without device (05/23/14)   Baseline 30 uses cane intermittantly, knee sometimes gives away.   Time 6   Period Weeks   Status On-going   PT LONG TERM GOAL #4   Title RLE circumferential measurement within 0.5 cm of LLE for decreased edema and improved function (05/23/14)   Time 6   Status On-going               Plan - 05/29/14 1340    Clinical Impression Statement Able to progress home exercise program, ROM improving, strength improving.        Problem List Patient Active Problem List   Diagnosis Date Noted  . Right knee pain 08/23/2013  . Left knee pain 06/02/2013  . Migraine 03/16/2012  . Obesity (BMI 30.0-34.9) 11/25/2011  . Allergic rhinitis 11/25/2011  . Primary localized osteoarthritis of right knee 11/25/2011  . History of viral pericarditis 11/25/2011    [facility] 05/29/2014, 1:46 PM  [facility] [address] [place], [place], [address] Phone: [phone]   Fax:  [phone]     Liz Beach, PTA 05/29/2014 1:46 PM Phone: [phone] Fax: [phone]

## 2014-05-31 ENCOUNTER — Ambulatory Visit: Payer: PRIVATE HEALTH INSURANCE | Admitting: Physical Therapy

## 2014-05-31 DIAGNOSIS — M25661 Stiffness of right knee, not elsewhere classified: Secondary | ICD-10-CM

## 2014-05-31 DIAGNOSIS — M6281 Muscle weakness (generalized): Secondary | ICD-10-CM

## 2014-05-31 DIAGNOSIS — Z96651 Presence of right artificial knee joint: Secondary | ICD-10-CM | POA: Diagnosis not present

## 2014-05-31 NOTE — Therapy (Signed)
[facility] [address] [place], [place], [address] Phone: [phone]   Fax:  [phone]  Physical Therapy Treatment  Patient Details  Name: Dawn [name] MRN: [id number] Date of Birth: 05/25/1965 Referring Provider:  Renford DillsPolite, Ronald, MD  Encounter Date: 05/31/2014      PT End of Session - 05/31/14 1317    Visit Number 23   Number of Visits 24   Date for PT Re-Evaluation 06/23/14   PT Start Time 0845   PT Stop Time 0945   PT Time Calculation (min) 60 min   Activity Tolerance Patient tolerated treatment well      Past Medical History  Diagnosis Date  . Acute meniscal tear of knee LEFT  . Seasonal allergies   . Environmental allergies   . History of gastric ulcer AS TEEN  . 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 Arthritis   . Asthma   . Contact lens/glasses fitting     wears contacts or glasses  . Primary localized osteoarthritis of right knee 11/25/2011    S/P Left Knee arthroscopy performed by Dr. Charlann Boxerlin in April 2013.     Past Surgical History  Procedure Laterality Date  . Vaginal hysterectomy  03-18-1999  . Cholecystectomy  1992  . Appendectomy  1998  . Bilateral carpal tunnel release  1980'S  . Transthoracic echocardiogram  04-26-2009    NORMAL LVSF/ EF 60-65% / LVDF NORMAL / NO PERICARDIAL EFFUSION  . Knee arthroscopy  06/20/2011    Procedure: ARTHROSCOPY KNEE;  Surgeon: Shelda PalMatthew D Olin, MD;  [facility];  Service: Orthopedics;  Laterality: Left;  left knee scope   latex allergy patient blisters and swells  . Breast reduction surgery Bilateral 12/21/2012    Procedure: BILATERAL MAMMARY REDUCTION  (BREAST);  Surgeon: Etter Sjogrenavid Bowers, MD;  Location: [facility];  Service: Plastics;  Laterality: Bilateral;  . Partial knee arthroplasty Right 03/03/2014    Procedure: RIGHT UNICOMPARTMENTAL KNEE ARTHROPLASTY;   Surgeon: Eulas PostJoshua P Landau, MD;  Location:  SUR[facility]ERY [facility];  Service: Orthopedics;  Laterality: Right;    There were no vitals filed for this visit.  Visit Diagnosis:  Knee stiffness, right  Muscle weakness of lower extremity      Subjective Assessment - 05/31/14 0843    Subjective Stiff, doing her exercises.  May have done too much because it ached last night , didn't get much sleep.   Pain Score 0-No pain   Pain Location Knee   Pain Orientation Right   Pain Descriptors / Indicators Aching   Aggravating Factors  exercising too much   Pain Relieving Factors meds, cold  intermittant                       [facility] Adult PT Treatment/Exercise - 05/31/14 0851    Lumbar Exercises: Stretches   Passive Hamstring Stretch --  passive stretch into extension   Quad Stretch --  quad stretch with patellar glides inferior over edge of bed   Lumbar Exercises: Aerobic   Stationary Bike 5 minutes 1.1 mile   Knee/Hip Exercises: Dentisttretches   Quad Stretch Other (comment)  over edge of mat with patellar mobs.   Knee/Hip Exercises: Aerobic   Stationary Bike 5  1.1 mile.   Knee/Hip Exercises: Standing   Heel Raises 10 reps  1 leg   Forward Step Up 20 reps  Wall Squat 10 reps;10 seconds   Knee/Hip Exercises: Seated   Other Seated Knee Exercises cybex knee flexion 2 plates , both 20 reps   Knee/Hip Exercises: Supine   Quad Sets 10 reps   Heel Slides 10 reps  2 sets , AROM, AAROM   Cryotherapy   Number Minutes Cryotherapy 20 Minutes   Cryotherapy Location Knee   Type of Cryotherapy --  vasopneumatic, moderate pressure, 32 degrees   Manual Therapy   Manual Therapy Edema management;Joint mobilization  scar patellar kneemobs, taping edema, scar, muscles   Joint Mobilization RT   Passive ROM 110 PROM   Other Manual Therapy Heavy manual tolerated today                  PT Short Term Goals - 05/29/14 1343    PT SHORT TERM GOAL #1   Title independent with  HEP (04/25/14)   Time 4   Period Weeks   Status Achieved   PT SHORT TERM GOAL #2   Title improve R knee active ROM 5-75 for improved mobility (04/25/14)   Time 4   Period Weeks   Status Achieved   PT SHORT TERM GOAL #3   Title ambulate > 150' with single point cane modified independent for improved mobility (04/25/14)   Time 4   Status Achieved   PT SHORT TERM GOAL #4   Title report pain < 3/10 for improved function and decreased pain (04/25/14)   Time 4   Period Weeks   Status Achieved           PT Long Term Goals - 05/31/14 1322    PT LONG TERM GOAL #1   Title independent with advanced HEP (05/23/14)   Time 6   Period Weeks   Status On-going   PT LONG TERM GOAL #2   Title improve R knee active ROM 0-110 for improved function and mobility (05/23/14)   Time 6   Period Weeks   Status On-going   PT LONG TERM GOAL #3   Title report ability to ambulate > 30 min without increase in pain without device (05/23/14)   Time 6   Period Weeks   Status On-going   PT LONG TERM GOAL #4   Title RLE circumferential measurement within 0.5 cm of LLE for decreased edema and improved function (05/23/14)   Time 6   Period Weeks   Status Unable to assess               Plan - 05/31/14 1320    Clinical Impression Statement ROM gradually improving.  Starts work 1/2 days Monday.     PT Next Visit Plan ROM, quad control check for SLR.          Problem List Patient Active Problem List   Diagnosis Date Noted  . Right knee pain 08/23/2013  . Left knee pain 06/02/2013  . Migraine 03/16/2012  . Obesity (BMI 30.0-34.9) 11/25/2011  . Allergic rhinitis 11/25/2011  . Primary localized osteoarthritis of right knee 11/25/2011  . History of viral pericarditis 11/25/2011  Liz Beach, PTA 05/31/2014 5:32 PM Phone: [phone] Fax: [phone]   [facility] 05/31/2014, 5:32 PM  [facility] [address] [place], [place],  [address] Phone: [phone]   Fax:  [phone]

## 2014-05-31 NOTE — Patient Instructions (Signed)
Remove tape if irritating 

## 2014-06-02 ENCOUNTER — Ambulatory Visit: Payer: PRIVATE HEALTH INSURANCE | Admitting: Physical Therapy

## 2014-06-02 DIAGNOSIS — M25661 Stiffness of right knee, not elsewhere classified: Secondary | ICD-10-CM

## 2014-06-02 DIAGNOSIS — Z96651 Presence of right artificial knee joint: Secondary | ICD-10-CM

## 2014-06-02 DIAGNOSIS — M6281 Muscle weakness (generalized): Secondary | ICD-10-CM

## 2014-06-02 DIAGNOSIS — R269 Unspecified abnormalities of gait and mobility: Secondary | ICD-10-CM

## 2014-06-02 NOTE — Therapy (Signed)
[place], [place], [address] Phone: [phone]   Fax:  [phone]  Physical Therapy Treatment  Patient Details  Name: Dawn [name] MRN: [id number] Date of Birth: [birth year] Referring Provider:  Seward Carol, MD  Encounter Date: 06/02/2014      PT End of Session - 06/02/14 1144    Visit Number 24   Number of Visits 30   Date for PT Re-Evaluation 06/23/14   Authorization Type W/C case manager Krystal Clark called and per the adjuster approved 6 more visits. 06/02/14   PT Start Time 0935   PT Stop Time 1048   PT Time Calculation (min) 73 min   Activity Tolerance Patient tolerated treatment well      Past Medical History  Diagnosis Date  . Acute meniscal tear of knee LEFT  . Seasonal allergies   . Environmental allergies   . History of gastric ulcer AS TEEN  . 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 Arthritis   . Asthma   . Contact lens/glasses fitting     wears contacts or glasses  . Primary localized osteoarthritis of right knee 11/25/2011    S/P Left Knee arthroscopy performed by Dr. Alvan Dame in April 2013.     Past Surgical History  Procedure Laterality Date  . Vaginal hysterectomy  03-18-1999  . Cholecystectomy  1992  . Appendectomy  1998  . Bilateral carpal tunnel release  1980'S  . Transthoracic echocardiogram  04-26-2009    NORMAL LVSF/ EF 60-65% / LVDF NORMAL / NO PERICARDIAL EFFUSION  . Knee arthroscopy  06/20/2011    Procedure: ARTHROSCOPY KNEE;  Surgeon: Mauri Pole, MD;  Location[facility];  Service: Orthopedics;  Laterality: Left;  left knee scope   latex allergy patient blisters and swells  . Breast reduction surgery Bilateral 12/21/2012    Procedure: BILATERAL MAMMARY REDUCTION  (BREAST);  Surgeon: Crissie Reese, MD;  Location: [place];  Service: Plastics;  Laterality:  Bilateral;  . Partial knee arthroplasty Right 03/03/2014    Procedure: RIGHT UNICOMPARTMENTAL KNEE ARTHROPLASTY;  Surgeon: Johnny Bridge, MD;  Location: [place];  Service: Orthopedics;  Laterality: Right;    There were no vitals filed for this visit.  Visit Diagnosis:  Knee stiffness, right  Muscle weakness of lower extremity  S/P right unicompartmental knee replacement  Abnormality of gait      Subjective Assessment - 06/02/14 0944    Subjective Reports excellent response to kinesiotaping.  "I can tell a big difference."  Walking better.  Returning to work shortened hours 4/day next week for 2 weeks.     Currently in Pain? No/denies   Pain Score 0-No pain   Pain Location Knee   Pain Orientation Right   Pain Type Surgical pain   Pain Onset More than a month ago   Pain Frequency Intermittent   Aggravating Factors  bending knee   Pain Relieving Factors meds, cold            [facility] PT Assessment - 06/02/14 1000    AROM   Right Knee Extension 7   Right Knee Flexion 113                   [facility] Adult PT Treatment/Exercise - 06/02/14 1005    Knee/Hip Exercises: Aerobic   Stationary Bike 7   Knee/Hip Exercises: Machines for Strengthening  Cybex Leg Press 30# right only 50x seat 9   Knee/Hip Exercises: Standing   Forward Step Up Right;10 reps;Hand Hold: 1;Step Height: 6"   Other Standing Knee Exercises step up and overs 4inch 10x   Other Standing Knee Exercises high step walk 25 feet   Knee/Hip Exercises: Seated   Long Arc Quad Right;2 sets;10 reps;Weights   Long Arc Quad Weight 4 lbs.   Other Seated Knee Exercises seated red band HS curls 25x   Other Seated Knee Exercises seated terminal knee extensions 2x10   Cryotherapy   Number Minutes Cryotherapy 20 Minutes   Cryotherapy Location Knee   Type of Cryotherapy --  vasocompression 32 degrees moderate    Manual Therapy   Manual Therapy Joint mobilization;Myofascial release   Joint  Mobilization knee flexion and extension mobs seated and supine grade 3/4 10 reps each   Myofascial Release instrument assisted quad                   PT Short Term Goals - 06/02/14 1149    PT SHORT TERM GOAL #1   Title independent with HEP (04/25/14)   Status Achieved   PT SHORT TERM GOAL #2   Title improve R knee active ROM 5-75 for improved mobility (04/25/14)   Status Achieved   PT SHORT TERM GOAL #3   Title ambulate > 150' with single point cane modified independent for improved mobility (04/25/14)   Status Achieved   PT SHORT TERM GOAL #4   Title report pain < 3/10 for improved function and decreased pain (04/25/14)   Status Achieved           PT Long Term Goals - 06/02/14 1150    PT LONG TERM GOAL #1   Title independent with advanced HEP (05/23/14)   Time 6   Period Weeks   Status On-going   PT LONG TERM GOAL #2   Title improve R knee active ROM 0-110 for improved function and mobility (05/23/14)   Time 6   Period Weeks   Status Partially Met   PT LONG TERM GOAL #3   Title report ability to ambulate > 30 min without increase in pain without device (05/23/14)   Time 6   Period Weeks   Status On-going   PT LONG TERM GOAL #4   Title RLE circumferential measurement within 0.5 cm of LLE for decreased edema and improved function (05/23/14)   Time 6   Period Weeks   Status On-going               Plan - 06/02/14 1146    Clinical Impression Statement Knee flexion AROM gradually improving 113 degrees;  quad activation improving as well although still a lag at terminal knee extension.  Difficulty with descending steps.  Patient expresses readiness to return to work shortened hours next week for 2 weeks.  W/C case manager, Krystal Clark, approved 6 more visits.     PT Next Visit Plan Knee flexion and extension ROM;  quad strengthening especially in 15-0 range; manual therapy; cryotherapy        Problem List Patient Active Problem List   Diagnosis Date Noted  .  Right knee pain 08/23/2013  . Left knee pain 06/02/2013  . Migraine 03/16/2012  . Obesity (BMI 30.0-34.9) 11/25/2011  . Allergic rhinitis 11/25/2011  . Primary localized osteoarthritis of right knee 11/25/2011  . History of viral pericarditis 11/25/2011    Ruben Im C 06/02/2014, 11:57 AM  [place]  [facility] [address] [place], [place], [address] Phone: [phone]   Fax:  [phone]  Ruben Im, PT 06/02/2014 11:57 AM Phone: [phone] Fax: [phone]

## 2014-06-06 ENCOUNTER — Ambulatory Visit: Payer: PRIVATE HEALTH INSURANCE | Admitting: Physical Therapy

## 2014-06-06 DIAGNOSIS — M6281 Muscle weakness (generalized): Secondary | ICD-10-CM

## 2014-06-06 DIAGNOSIS — Z96651 Presence of right artificial knee joint: Secondary | ICD-10-CM | POA: Diagnosis not present

## 2014-06-06 DIAGNOSIS — R269 Unspecified abnormalities of gait and mobility: Secondary | ICD-10-CM

## 2014-06-06 DIAGNOSIS — M25661 Stiffness of right knee, not elsewhere classified: Secondary | ICD-10-CM

## 2014-06-06 NOTE — Therapy (Signed)
[facility] [address] [place], , [address] Phone: [phone]   Fax:  [phone]  Physical Therapy Treatment  Patient Details  Name: Dawn [name] MRN: [id number] Date of Birth: 04/06/1965 Referring Provider:  Polite, Ronald, MD  Encounter Date: 06/06/2014      PT End of Session - 06/06/14 1456    Visit Number 25   Number of Visits 30   Date for PT Re-Evaluation 06/23/14   Authorization Type W/C case manager Maria Plott called and per the adjuster approved 6 more visits. 06/02/14   PT Start Time 1324   PT Stop Time 1453   PT Time Calculation (min) 89 min   Activity Tolerance Patient tolerated treatment well      Past Medical History  Diagnosis Date  . Acute meniscal tear of knee LEFT  . Seasonal allergies   . Environmental allergies   . History of gastric ulcer AS TEEN  . 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 Arthritis   . Asthma   . Contact lens/glasses fitting     wears contacts or glasses  . Primary localized osteoarthritis of right knee 11/25/2011    S/P Left Knee arthroscopy performed by Dr. Olin in April 2013.     Past Surgical History  Procedure Laterality Date  . Vaginal hysterectomy  03-18-1999  . Cholecystectomy  1992  . Appendectomy  1998  . Bilateral carpal tunnel release  1980'S  . Transthoracic echocardiogram  04-26-2009    NORMAL LVSF/ EF 60-65% / LVDF NORMAL / NO PERICARDIAL EFFUSION  . Knee arthroscopy  06/20/2011    Procedure: ARTHROSCOPY KNEE;  Surgeon: Matthew D Olin, MD;  Location: [facility];  Service: Orthopedics;  Laterality: Left;  left knee scope   latex allergy patient blisters and swells  . Breast reduction surgery Bilateral 12/21/2012    Procedure: BILATERAL MAMMARY REDUCTION  (BREAST);  Surgeon: David Bowers, MD;  Location: [facility];  Service: Plastics;  Laterality:  Bilateral;  . Partial knee arthroplasty Right 03/03/2014    Procedure: RIGHT UNICOMPARTMENTAL KNEE ARTHROPLASTY;  Surgeon: Joshua P Landau, MD;  Location: [facility];  Service: Orthopedics;  Laterality: Right;    There were no vitals filed for this visit.  Visit Diagnosis:  Knee stiffness, right  Muscle weakness of lower extremity  S/P right unicompartmental knee replacement  Abnormality of gait      Subjective Assessment - 06/06/14 1325    Subjective Returned to work shortened hours yesterday.  Patient states she was really sore Friday night after Friday's strengthening in PT.  States no increase in pain with return to work but increase in swelling.     Currently in Pain? No/denies   Pain Score 0-No pain   Pain Location Knee   Pain Orientation Right   Pain Type Surgical pain   Pain Onset More than a month ago   Pain Frequency Intermittent   Aggravating Factors  being up a long time;  bending knee   Pain Relieving Factors cold vasocompression                       [facility] Adult PT Treatment/Exercise - 06/06/14 1331    Knee/Hip Exercises: Aerobic   Stationary Bike 8   Knee/Hip Exercises: Standing   Forward Lunges Both;10 reps   Lateral Step Up Right;15 reps;Hand Hold: 0;Step Height: 6"     Forward Step Up Right;15 reps;Hand Hold: 0;Step Height: 6"   Step Down Right;10 reps;Hand Hold: 1;Step Height: 4"   Walking with Sports Cord FW and BW 5x each   Knee/Hip Exercises: Seated   Long Arc Quad Strengthening;Right;3 sets;10 reps;Weights   Long Arc Quad Weight 4 lbs.   Other Seated Knee Exercises seated green band HS curls 25x   Other Seated Knee Exercises seated terminal knee extensions 2x10   Cryotherapy   Number Minutes Cryotherapy 20 Minutes   Cryotherapy Location Knee   Type of Cryotherapy --  vasocompression   Manual Therapy   Manual Therapy Joint mobilization;Myofascial release   Joint Mobilization knee flexion and extension mobs seated and  supine grade 3/4 10 reps each   Myofascial Release instrument assisted quad    Other Manual Therapy Kinesiotaping for quad activation, scar mob and edema management                  PT Short Term Goals - 06/06/14 1500    PT SHORT TERM GOAL #1   Title independent with HEP (04/25/14)   Status Achieved   PT SHORT TERM GOAL #2   Title improve R knee active ROM 5-75 for improved mobility (04/25/14)   Status Achieved   PT SHORT TERM GOAL #3   Title ambulate > 150' with single point cane modified independent for improved mobility (04/25/14)   Status Achieved   PT SHORT TERM GOAL #4   Title report pain < 3/10 for improved function and decreased pain (04/25/14)   Status Achieved           PT Long Term Goals - 06/06/14 1500    PT LONG TERM GOAL #1   Title independent with advanced HEP (05/23/14)   Time 6   Period Weeks   Status On-going   PT LONG TERM GOAL #2   Title improve R knee active ROM 0-110 for improved function and mobility (05/23/14)   Time 6   Period Weeks   Status Partially Met   PT LONG TERM GOAL #3   Title report ability to ambulate > 30 min without increase in pain without device (05/23/14)   Time 6   Period Weeks   Status Partially Met   PT LONG TERM GOAL #4   Title RLE circumferential measurement within 0.5 cm of LLE for decreased edema and improved function (05/23/14)   Baseline 1.5 cm difference on 05/26/14   Time 6   Period Weeks   Status On-going               Plan - 06/06/14 1456    Clinical Impression Statement Quad motor control continues to improve but with continued deficits at terminal extension.  No episodes of knee give-way although patient notes weakness with mini lunges.  Therapist monitoring for safety.   Mild increase in swelling with return to work but no loss in knee flexion AROM.  Patient able to come only 1x this week secondary to meeting/event on Friday.  Patient reports good relief and support with taping.     PT Next Visit Plan Knee  flexion and extension ROM;  quad strengthening especially in 15-0 range; manual therapy; cryotherapy;  consider Elliptical         Problem List Patient Active Problem List   Diagnosis Date Noted  . Right knee pain 08/23/2013  . Left knee pain 06/02/2013  . Migraine 03/16/2012  . Obesity (BMI 30.0-34.9) 11/25/2011  . Allergic rhinitis 11/25/2011  . Primary  localized osteoarthritis of right knee 11/25/2011  . History of viral pericarditis 11/25/2011    Simpson, Stacy C 06/06/2014, 3:01 PM  [facility] [address] [place], [place], [address] Phone: [phone]   Fax:  [phone]  Stacy Simpson, PT 06/06/2014 3:02 PM Phone: [phone] Fax: [phone]    

## 2014-06-09 ENCOUNTER — Encounter: Payer: Self-pay | Admitting: Physical Therapy

## 2014-06-12 ENCOUNTER — Ambulatory Visit: Payer: PRIVATE HEALTH INSURANCE | Admitting: Physical Therapy

## 2014-06-12 DIAGNOSIS — M6281 Muscle weakness (generalized): Secondary | ICD-10-CM

## 2014-06-12 DIAGNOSIS — M25661 Stiffness of right knee, not elsewhere classified: Secondary | ICD-10-CM

## 2014-06-12 DIAGNOSIS — Z96651 Presence of right artificial knee joint: Secondary | ICD-10-CM | POA: Diagnosis not present

## 2014-06-12 NOTE — Therapy (Signed)
[place], [place], [address] Phone: [phone]   Fax:  [phone]  Physical Therapy Treatment  Patient Details  Name: Dawn [name] MRN: [id number] Date of Birth: Jul 21, 1965 Referring Provider:  Seward Carol, MD  Encounter Date: 06/12/2014      PT End of Session - 06/12/14 1608    Visit Number 26   Number of Visits 30   Date for PT Re-Evaluation 06/23/14   PT Start Time 8546   PT Stop Time 1520   PT Time Calculation (min) 62 min   Activity Tolerance Patient tolerated treatment well      Past Medical History  Diagnosis Date  . Acute meniscal tear of knee LEFT  . Seasonal allergies   . Environmental allergies   . History of gastric ulcer AS TEEN  . 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 Arthritis   . Asthma   . Contact lens/glasses fitting     wears contacts or glasses  . Primary localized osteoarthritis of right knee 11/25/2011    S/P Left Knee arthroscopy performed by Dr. Alvan Dame in April 2013.     Past Surgical History  Procedure Laterality Date  . Vaginal hysterectomy  03-18-1999  . Cholecystectomy  1992  . Appendectomy  1998  . Bilateral carpal tunnel release  1980'S  . Transthoracic echocardiogram  04-26-2009    NORMAL LVSF/ EF 60-65% / LVDF NORMAL / NO PERICARDIAL EFFUSION  . Knee arthroscopy  06/20/2011    Procedure: ARTHROSCOPY KNEE;  Surgeon: Mauri Pole, MD;  Location: [facility];  Service: Orthopedics;  Laterality: Left;  left knee scope   latex allergy patient blisters and swells  . Breast reduction surgery Bilateral 12/21/2012    Procedure: BILATERAL MAMMARY REDUCTION  (BREAST);  Surgeon: Crissie Reese, MD;  Location: [facility];  Service: Plastics;  Laterality: Bilateral;  . Partial knee arthroplasty Right 03/03/2014    Procedure: RIGHT UNICOMPARTMENTAL KNEE ARTHROPLASTY;   Surgeon: Johnny Bridge, MD;  Location: [facility];  Service: Orthopedics;  Laterality: Right;    There were no vitals filed for this visit.  Visit Diagnosis:  Knee stiffness, right  Muscle weakness of lower extremity      Subjective Assessment - 06/12/14 1440    Subjective sTIFF INSIDE.   tAPE helpful with swelling.  Less knee give away, not as deep a give away.   Currently in Pain? No/denies   Multiple Pain Sites No                         [facility] Adult PT Treatment/Exercise - 06/12/14 1420    Lumbar Exercises: Aerobic   Stationary Bike 5 minutes   Elliptical 2 minutrs ramp 1   Knee/Hip Exercises: Public affairs consultant --  10 reps prone with overpressure.   Knee/Hip Exercises: Supine   Quad Sets 10 reps  needed cues to activate quads vs gluteals for extension Long   Heel Slides 10 reps   Knee/Hip Exercises: Prone   Other Prone Exercises Terminal knee extension 10 reps 2 second holds.    Cryotherapy   Number Minutes Cryotherapy 20 Minutes   Cryotherapy Location Knee   Type of Cryotherapy --  vasopneumatic, 32 degrees, moderate pressure   Manual Therapy   Edema Management --  soft tissue work distal quads, scar tissue mobilization.  Joint Mobilization for knee flexion   Passive ROM 10   Other Manual Therapy kinesiotex taping as previous                  PT Short Term Goals - 06/06/14 1500    PT SHORT TERM GOAL #1   Title independent with HEP (04/25/14)   Status Achieved   PT SHORT TERM GOAL #2   Title improve R knee active ROM 5-75 for improved mobility (04/25/14)   Status Achieved   PT SHORT TERM GOAL #3   Title ambulate > 150' with single point cane modified independent for improved mobility (04/25/14)   Status Achieved   PT SHORT TERM GOAL #4   Title report pain < 3/10 for improved function and decreased pain (04/25/14)   Status Achieved           PT Long Term Goals - 06/12/14 1612    PT LONG TERM GOAL #1   Title  independent with advanced HEP (05/23/14)   Time 6   Period Weeks   Status On-going   PT LONG TERM GOAL #2   Title improve R knee active ROM 0-110 for improved function and mobility (05/23/14)   Baseline 105 degrees   Time 6   Period Weeks   Status Partially Met   PT LONG TERM GOAL #3   Title report ability to ambulate > 30 min without increase in pain without device (05/23/14)   Time 6   Period Weeks   Status On-going   PT LONG TERM GOAL #4   Title RLE circumferential measurement within 0.5 cm of LLE for decreased edema and improved function (05/23/14)   Time 6   Period Weeks   Status Unable to assess               Plan - 06/12/14 1610    Clinical Impression Statement ROM continues to improve 105 degrees flexion AROM, -8 extension. Patient able to tolerate more stretching today.  Needs more work on gait which is stiff and slow.   PT Next Visit Plan Knee flexion and extension ROM;  quad strengthening especially in 15-0 range; manual therapy; cryotherapy;  consider Elliptical    Consulted and Agree with Plan of Care Patient        Problem List Patient Active Problem List   Diagnosis Date Noted  . Right knee pain 08/23/2013  . Left knee pain 06/02/2013  . Migraine 03/16/2012  . Obesity (BMI 30.0-34.9) 11/25/2011  . Allergic rhinitis 11/25/2011  . Primary localized osteoarthritis of right knee 11/25/2011  . History of viral pericarditis 11/25/2011    [facility] 06/12/2014, 4:14 PM  [facility] [address] [place], [place], [address] Phone: [phone]   Fax:  [phone]   Melvenia Needles, PTA 06/12/2014 4:14 PM Phone: [phone] Fax: [phone]

## 2014-06-13 ENCOUNTER — Ambulatory Visit: Payer: PRIVATE HEALTH INSURANCE | Admitting: Physical Therapy

## 2014-06-13 DIAGNOSIS — Z96651 Presence of right artificial knee joint: Secondary | ICD-10-CM

## 2014-06-13 DIAGNOSIS — M25661 Stiffness of right knee, not elsewhere classified: Secondary | ICD-10-CM

## 2014-06-13 DIAGNOSIS — M6281 Muscle weakness (generalized): Secondary | ICD-10-CM

## 2014-06-13 DIAGNOSIS — R269 Unspecified abnormalities of gait and mobility: Secondary | ICD-10-CM

## 2014-06-13 NOTE — Therapy (Signed)
[place], [place], [address] Phone: [phone]   Fax:  [phone]  Physical Therapy Treatment  Patient Details  Name: Dawn [name] MRN: [id number] Date of Birth: [birth year] Referring Provider:  Seward Carol, MD  Encounter Date: 06/13/2014      PT End of Session - 06/13/14 1418    Visit Number 27   Number of Visits 30   Date for PT Re-Evaluation 06/23/14   Authorization Type W/C case manager Krystal Clark called and per the adjuster approved 6 more visits. 06/02/14   PT Start Time 1329   PT Stop Time 1443   PT Time Calculation (min) 74 min   Activity Tolerance Patient tolerated treatment well      Past Medical History  Diagnosis Date  . Acute meniscal tear of knee LEFT  . Seasonal allergies   . Environmental allergies   . History of gastric ulcer AS TEEN  . 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 Arthritis   . Asthma   . Contact lens/glasses fitting     wears contacts or glasses  . Primary localized osteoarthritis of right knee 11/25/2011    S/P Left Knee arthroscopy performed by Dr. Alvan Dame in April 2013.     Past Surgical History  Procedure Laterality Date  . Vaginal hysterectomy  03-18-1999  . Cholecystectomy  1992  . Appendectomy  1998  . Bilateral carpal tunnel release  1980'S  . Transthoracic echocardiogram  04-26-2009    NORMAL LVSF/ EF 60-65% / LVDF NORMAL / NO PERICARDIAL EFFUSION  . Knee arthroscopy  06/20/2011    Procedure: ARTHROSCOPY KNEE;  Surgeon: Mauri Pole, MD;  Location: [facility];  Service: Orthopedics;  Laterality: Left;  left knee scope   latex allergy patient blisters and swells  . Breast reduction surgery Bilateral 12/21/2012    Procedure: BILATERAL MAMMARY REDUCTION  (BREAST);  Surgeon: Crissie Reese, MD;  Location: [place];  Service: Plastics;  Laterality:  Bilateral;  . Partial knee arthroplasty Right 03/03/2014    Procedure: RIGHT UNICOMPARTMENTAL KNEE ARTHROPLASTY;  Surgeon: Johnny Bridge, MD;  Location: [place];  Service: Orthopedics;  Laterality: Right;    There were no vitals filed for this visit.  Visit Diagnosis:  Knee stiffness, right  Muscle weakness of lower extremity  S/P right unicompartmental knee replacement  Abnormality of gait      Subjective Assessment - 06/13/14 1332    Subjective States she was hurting some last evening possibly from Elliptical but taping helped.     Currently in Pain? Yes   Pain Location Knee   Pain Orientation Right   Pain Type Surgical pain   Pain Onset More than a month ago   Pain Frequency Intermittent   Aggravating Factors  being up a long time; bending knee   Pain Relieving Factors vasocompression                         [facility] Adult PT Treatment/Exercise - 06/13/14 1334    Knee/Hip Exercises: Aerobic   Stationary Bike 5   Knee/Hip Exercises: Machines for Strengthening   Cybex Leg Press 30# right only 50x seat 8   Knee/Hip Exercises: Standing   Terminal Knee Extension Right;2 sets;10 reps;Theraband   Theraband Level (Terminal Knee Extension) Level 3 (Green)   Other Standing Knee Exercises 4 in step  down 3 points 15x   Other Standing Knee Exercises high step, BW walk, butt kicks 30 feet each   Knee/Hip Exercises: Seated   Long Arc Quad Right;2 sets;Weights   Long Arc Quad Weight 5 lbs.   Heel Slides Strengthening;Right;2 sets;10 reps  green band   Other Seated Knee Exercises Knee extension machine knee extension 15# B 2x10   Other Seated Knee Exercises seated terminal knee extensions 2x10   Cryotherapy   Number Minutes Cryotherapy 20 Minutes   Cryotherapy Location Knee   Type of Cryotherapy --  vasocompression moderate 32 degrees   Manual Therapy   Manual Therapy Joint mobilization;Myofascial release   Joint Mobilization for knee flexion    Massage Soft tissue work distal quads, pt reports decreased pain.    Passive ROM 10                  PT Short Term Goals - 06/13/14 1422    PT SHORT TERM GOAL #1   Title independent with HEP (04/25/14)   Status Achieved   PT SHORT TERM GOAL #2   Title improve R knee active ROM 5-75 for improved mobility (04/25/14)   Status Achieved   PT SHORT TERM GOAL #3   Title ambulate > 150' with single point cane modified independent for improved mobility (04/25/14)   Status Achieved   PT SHORT TERM GOAL #4   Title report pain < 3/10 for improved function and decreased pain (04/25/14)   Status Achieved           PT Long Term Goals - 06/13/14 1423    PT LONG TERM GOAL #1   Title independent with advanced HEP (05/23/14)   Time 6   Period Weeks   Status On-going   PT LONG TERM GOAL #2   Title improve R knee active ROM 0-110 for improved function and mobility (05/23/14)   Time 6   Period Weeks   Status Partially Met   PT LONG TERM GOAL #3   Title report ability to ambulate > 30 min without increase in pain without device (05/23/14)   Time 6   Period Weeks   Status On-going   PT LONG TERM GOAL #4   Title RLE circumferential measurement within 0.5 cm of LLE for decreased edema and improved function (05/23/14)   Baseline 1.5 cm difference on 05/26/14   Period Weeks   Status On-going               Plan - 06/13/14 1418    Clinical Impression Statement Patient continues to improve with ROM and function.  Strength deficit in terminal knee extension.  Muscular fatigue with single leg exercises and eccentric lowering with step downs.    Should meet remaining goals in 2-3 visits.  Therapist monitoring for patellofemoral alignment and cueing to decrease hip hike with knee extension.     PT Next Visit Plan Remeasure knee ROM;  send MD update;  continue quad strengthening; recheck circumferential measurements for swelling        Problem List Patient Active Problem List   Diagnosis  Date Noted  . Right knee pain 08/23/2013  . Left knee pain 06/02/2013  . Migraine 03/16/2012  . Obesity (BMI 30.0-34.9) 11/25/2011  . Allergic rhinitis 11/25/2011  . Primary localized osteoarthritis of right knee 11/25/2011  . History of viral pericarditis 11/25/2011    Alvera Singh 06/13/2014, 2:26 PM  [facility] [address] [place], [place], [address] Phone:  [phone]   Fax:  [place], PT 06/13/2014 2:26 PM Phone: [phone] Fax: [phone]

## 2014-06-19 ENCOUNTER — Encounter: Payer: Self-pay | Admitting: Physical Therapy

## 2014-06-22 ENCOUNTER — Ambulatory Visit: Payer: PRIVATE HEALTH INSURANCE | Admitting: Physical Therapy

## 2014-06-22 DIAGNOSIS — M25661 Stiffness of right knee, not elsewhere classified: Secondary | ICD-10-CM

## 2014-06-22 DIAGNOSIS — R269 Unspecified abnormalities of gait and mobility: Secondary | ICD-10-CM

## 2014-06-22 DIAGNOSIS — Z96651 Presence of right artificial knee joint: Secondary | ICD-10-CM | POA: Diagnosis not present

## 2014-06-22 DIAGNOSIS — M6281 Muscle weakness (generalized): Secondary | ICD-10-CM

## 2014-06-22 NOTE — Therapy (Signed)
[place], [place], [address] Phone: [phone]   Fax:  [phone]  Physical Therapy Treatment  Patient Details  Name: Dawn [name] MRN: [id number] Date of Birth: [birth year] Referring Provider:  Seward Carol, MD  Encounter Date: 06/22/2014      PT End of Session - 06/22/14 1519    Visit Number 28   Number of Visits 30   Date for PT Re-Evaluation 07/07/14   Authorization Type W/C case manager Krystal Clark called and per the adjuster approved 6 more visits. 06/02/14   PT Start Time 1325   PT Stop Time 1440   PT Time Calculation (min) 75 min   Activity Tolerance Patient tolerated treatment well      Past Medical History  Diagnosis Date  . Acute meniscal tear of knee LEFT  . Seasonal allergies   . Environmental allergies   . History of gastric ulcer AS TEEN  . 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 Arthritis   . Asthma   . Contact lens/glasses fitting     wears contacts or glasses  . Primary localized osteoarthritis of right knee 11/25/2011    S/P Left Knee arthroscopy performed by Dr. Alvan Dame in April 2013.     Past Surgical History  Procedure Laterality Date  . Vaginal hysterectomy  03-18-1999  . Cholecystectomy  1992  . Appendectomy  1998  . Bilateral carpal tunnel release  1980'S  . Transthoracic echocardiogram  04-26-2009    NORMAL LVSF/ EF 60-65% / LVDF NORMAL / NO PERICARDIAL EFFUSION  . Knee arthroscopy  06/20/2011    Procedure: ARTHROSCOPY KNEE;  Surgeon: Mauri Pole, MD;  Location: [facility];  Service: Orthopedics;  Laterality: Left;  left knee scope   latex allergy patient blisters and swells  . Breast reduction surgery Bilateral 12/21/2012    Procedure: BILATERAL MAMMARY REDUCTION  (BREAST);  Surgeon: Crissie Reese, MD;  Location: [place];  Service: Plastics;  Laterality:  Bilateral;  . Partial knee arthroplasty Right 03/03/2014    Procedure: RIGHT UNICOMPARTMENTAL KNEE ARTHROPLASTY;  Surgeon: Johnny Bridge, MD;  Location: [place];  Service: Orthopedics;  Laterality: Right;    There were no vitals filed for this visit.  Visit Diagnosis:  Knee stiffness, right  Muscle weakness of lower extremity  S/P right unicompartmental knee replacement  Abnormality of gait      Subjective Assessment - 06/22/14 1324    Subjective Up to 6.5 hours at work;  reports some swelling persists;  decreased limp; no longer uses cane;  states biggest issue is strength on stairs;   a few nights a week my knee will talk to me; saw Dr. Mardelle Matte earlier in the week and patient states he thought she was doing well   Currently in Pain? No/denies   Pain Location Knee   Pain Orientation Right   Pain Type Surgical pain   Aggravating Factors  being up a long time;  sometimes at night   Pain Relieving Factors vasocompression            [facility] PT Assessment - 06/22/14 1347    Posture/Postural Control   Posture Comments sup pole 45.5 cm   AROM   Right Knee Extension 6   Right Knee Flexion 117   Strength   Right Knee Flexion 4/5   Right Knee Extension 4/5  [facility] Adult PT Treatment/Exercise - 06/22/14 1336    Knee/Hip Exercises: Aerobic   Stationary Bike 7 min 2 miles   Knee/Hip Exercises: Machines for Strengthening   Cybex Leg Press --  Cybex B knee extension 15# 2x10   Knee/Hip Exercises: Standing   Lateral Step Up Right;15 reps;Hand Hold: 0;Step Height: 6"   Step Down Right;10 reps;Hand Hold: 1;Step Height: 4"   Other Standing Knee Exercises Wall slides with ball on wall 15x   Other Standing Knee Exercises up and down steps reciprocally min UE touch 2x   Knee/Hip Exercises: Seated   Long Arc Quad Right;2 sets;Weights   Long Arc Quad Weight 5 lbs.   Stool Scoot - Round Trips flexion and extension 2 laps   Cryotherapy    Number Minutes Cryotherapy 20 Minutes   Cryotherapy Location Knee   Type of Cryotherapy --  vasocompression moderate 32 degrees   Manual Therapy   Manual Therapy Joint mobilization;Myofascial release   Joint Mobilization for knee flexion; knee extension mobs grade 3/4 3x 20 sec each;   patellar mobs grade 3 med/lat , sup/inferior 10x each   Passive ROM seated and supine knee flexion and extension 10x each                  PT Short Term Goals - 06/22/14 1525    PT SHORT TERM GOAL #1   Title independent with HEP (04/25/14)   Status Achieved   PT SHORT TERM GOAL #2   Title improve R knee active ROM 5-75 for improved mobility (04/25/14)   Status Achieved   PT SHORT TERM GOAL #3   Title ambulate > 150' with single point cane modified independent for improved mobility (04/25/14)   Status Achieved   PT SHORT TERM GOAL #4   Title report pain < 3/10 for improved function and decreased pain (04/25/14)   Status Achieved           PT Long Term Goals - 06/22/14 1526    PT LONG TERM GOAL #1   Title independent with advanced HEP (05/23/14)   Time 6   Period Weeks   Status On-going   PT LONG TERM GOAL #2   Title improve R knee active ROM 0-110 for improved function and mobility (05/23/14)   Time 6   Period Weeks   Status Partially Met   PT LONG TERM GOAL #3   Title report ability to ambulate > 30 min without increase in pain without device (05/23/14)   Status Achieved   PT LONG TERM GOAL #4   Title RLE circumferential measurement within 0.5 cm of LLE for decreased edema and improved function (05/23/14)   Period Weeks   Status Partially Met               Plan - 06/22/14 1521    Clinical Impression Statement Patient improving with right knee AROM 6-117 degrees.  Deficits with eccentric quad lowering (stepping down from  a curb/step) but improved especially in the last few weeks.  Able to ambulate without cane now and work 3/4 days.  Should meet remaining goals in next  2  visits.    PT Next Visit Plan continue with LE strengthening especially quads.  Hoping to get 120 degrees knee flexion in remaining 2 visits; vasocompression for edema control/pain control        Problem List Patient Active Problem List   Diagnosis Date Noted  . Right knee pain 08/23/2013  . Left knee pain 06/02/2013  .  Migraine 03/16/2012  . Obesity (BMI 30.0-34.9) 11/25/2011  . Allergic rhinitis 11/25/2011  . Primary localized osteoarthritis of right knee 11/25/2011  . History of viral pericarditis 11/25/2011    Alvera Singh 06/22/2014, 3:58 PM  [facility] [address] [place], [place], [address] Phone: [phone]   Fax:  [phone]   Ruben Im, PT 06/22/2014 3:59 PM Phone: [phone] Fax: [phone]

## 2014-06-26 ENCOUNTER — Ambulatory Visit: Payer: PRIVATE HEALTH INSURANCE | Attending: Internal Medicine | Admitting: Physical Therapy

## 2014-06-26 DIAGNOSIS — M25661 Stiffness of right knee, not elsewhere classified: Secondary | ICD-10-CM | POA: Diagnosis not present

## 2014-06-26 DIAGNOSIS — Z96651 Presence of right artificial knee joint: Secondary | ICD-10-CM | POA: Diagnosis not present

## 2014-06-26 DIAGNOSIS — R269 Unspecified abnormalities of gait and mobility: Secondary | ICD-10-CM | POA: Insufficient documentation

## 2014-06-26 DIAGNOSIS — M6281 Muscle weakness (generalized): Secondary | ICD-10-CM | POA: Insufficient documentation

## 2014-06-26 NOTE — Therapy (Signed)
[place], [place], [address] Phone: [phone]   Fax:  [phone]  Physical Therapy Treatment  Patient Details  Name: Dawn [name] MRN: [id number] Date of Birth: Aug 24, 1965 Referring Provider:  Seward Carol, MD  Encounter Date: 06/26/2014      PT End of Session - 06/26/14 1613    Visit Number 29   Number of Visits 30   Date for PT Re-Evaluation 07/07/14   PT Start Time 1332   PT Stop Time 1430   PT Time Calculation (min) 58 min   Activity Tolerance Patient tolerated treatment well;Patient limited by fatigue      Past Medical History  Diagnosis Date  . Acute meniscal tear of knee LEFT  . Seasonal allergies   . Environmental allergies   . History of gastric ulcer AS TEEN  . 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 Arthritis   . Asthma   . Contact lens/glasses fitting     wears contacts or glasses  . Primary localized osteoarthritis of right knee 11/25/2011    S/P Left Knee arthroscopy performed by Dr. Alvan Dame in April 2013.     Past Surgical History  Procedure Laterality Date  . Vaginal hysterectomy  03-18-1999  . Cholecystectomy  1992  . Appendectomy  1998  . Bilateral carpal tunnel release  1980'S  . Transthoracic echocardiogram  04-26-2009    NORMAL LVSF/ EF 60-65% / LVDF NORMAL / NO PERICARDIAL EFFUSION  . Knee arthroscopy  06/20/2011    Procedure: ARTHROSCOPY KNEE;  Surgeon: Mauri Pole, MD;  Location: [facility];  Service: Orthopedics;  Laterality: Left;  left knee scope   latex allergy patient blisters and swells  . Breast reduction surgery Bilateral 12/21/2012    Procedure: BILATERAL MAMMARY REDUCTION  (BREAST);  Surgeon: Crissie Reese, MD;  Location: [place];  Service: Plastics;  Laterality: Bilateral;  . Partial knee arthroplasty Right 03/03/2014    Procedure: RIGHT  UNICOMPARTMENTAL KNEE ARTHROPLASTY;  Surgeon: Johnny Bridge, MD;  Location: [place];  Service: Orthopedics;  Laterality: Right;    There were no vitals filed for this visit.  Visit Diagnosis:  Knee stiffness, right  Muscle weakness of lower extremity      Subjective Assessment - 06/26/14 1338    Subjective May  be overdoing it, pushing herself beyond limits.  has no energy. .  Pain only at night. 1 pain pill at night    Currently in Pain? No/denies   Pain Score 0-No pain   Pain Orientation Right                         [facility] Adult PT Treatment/Exercise - 06/26/14 1331    Knee/Hip Exercises: Stretches   Passive Hamstring Stretch --  10 reps, posterior knee felt stretch.   Knee/Hip Exercises: Standing   Heel Raises 10 reps   Forward Lunges --  static with knee on counter and hands holding pole 10 reps e   Side Lunges --  side lunge around mat 1 rep   Wall Squat 10 reps   SLS 30+ seconds   Other Standing Knee Exercises terminal knee extension 10 reps each 3 positions, able to advance to blue band., one issued for home.   Knee/Hip Exercises: Supine   Quad Sets 10 reps   Heel Slides 5 reps  120  degrees almost all active, may be due to decreased edema   Straight Leg Raises 10 reps  quad lag noted. 4-5 degrees.   Patellar Mobs non tight when checked   Cryotherapy   Number Minutes Cryotherapy 20 Minutes   Cryotherapy Location Knee   Type of Cryotherapy --  vasopneumatic, 32 degrees, moderate pressure   Manual Therapy   Massage --  retrograde, for edema, edema improved                  PT Short Term Goals - 06/22/14 1525    PT SHORT TERM GOAL #1   Title independent with HEP (04/25/14)   Status Achieved   PT SHORT TERM GOAL #2   Title improve R knee active ROM 5-75 for improved mobility (04/25/14)   Status Achieved   PT SHORT TERM GOAL #3   Title ambulate > 150' with single point cane modified independent for improved mobility  (04/25/14)   Status Achieved   PT SHORT TERM GOAL #4   Title report pain < 3/10 for improved function and decreased pain (04/25/14)   Status Achieved           PT Long Term Goals - 06/26/14 1620    PT LONG TERM GOAL #1   Title independent with advanced HEP (05/23/14)   Time 6   Period Weeks   Status On-going   PT LONG TERM GOAL #2   Title improve R knee active ROM 0-110 for improved function and mobility (05/23/14)   Baseline 115 active flexion, -5 degrees extension   Period Weeks   Status Partially Met   PT LONG TERM GOAL #3   Title report ability to ambulate > 30 min without increase in pain without device (05/23/14)   Status Achieved   PT LONG TERM GOAL #4   Title RLE circumferential measurement within 0.5 cm of LLE for decreased edema and improved function (05/23/14)   Time 6   Period Weeks   Status Unable to assess               Plan - 06/26/14 1615    Clinical Impression Statement No pain post session, fatigues whole session.          Problem List Patient Active Problem List   Diagnosis Date Noted  . Right knee pain 08/23/2013  . Left knee pain 06/02/2013  . Migraine 03/16/2012  . Obesity (BMI 30.0-34.9) 11/25/2011  . Allergic rhinitis 11/25/2011  . Primary localized osteoarthritis of right knee 11/25/2011  . History of viral pericarditis 11/25/2011    [place] 06/26/2014, 4:22 PM  [facility] [address] [place], [place], [address] Phone: [phone]   Fax:  [phone]  Melvenia Needles, PTA 06/26/2014 4:22 PM Phone: [phone] Fax: [phone]

## 2014-06-29 ENCOUNTER — Ambulatory Visit: Payer: PRIVATE HEALTH INSURANCE | Admitting: Physical Therapy

## 2014-06-29 DIAGNOSIS — Z96651 Presence of right artificial knee joint: Secondary | ICD-10-CM

## 2014-06-29 DIAGNOSIS — M6281 Muscle weakness (generalized): Secondary | ICD-10-CM

## 2014-06-29 DIAGNOSIS — M25661 Stiffness of right knee, not elsewhere classified: Secondary | ICD-10-CM

## 2014-06-29 DIAGNOSIS — R269 Unspecified abnormalities of gait and mobility: Secondary | ICD-10-CM

## 2014-06-30 NOTE — Patient Instructions (Signed)
Focus on endrange knee extension with step downs and seated knee extension.  Also can use blue band for mid to endrange knee extension.

## 2014-06-30 NOTE — Therapy (Signed)
[place], [place], [address] Phone: [phone]   Fax:  [phone]  Physical Therapy Treatment/Discharge summary  Patient Details  Name: Dawn [name] MRN: [id number] Date of Birth: 1965/08/04 Referring Provider:  Seward Carol, MD  Encounter Date: 06/29/2014      PT End of Session - 06/30/14 0640    Visit Number 30   Number of Visits 30   Date for PT Re-Evaluation 07/07/14   Authorization Type W/C case manager Krystal Clark called and per the adjuster approved 6 more visits. 06/02/14   PT Start Time 1330   PT Stop Time 1435   PT Time Calculation (min) 65 min   Activity Tolerance Patient tolerated treatment well      Past Medical History  Diagnosis Date  . Acute meniscal tear of knee LEFT  . Seasonal allergies   . Environmental allergies   . History of gastric ulcer AS TEEN  . 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 Arthritis   . Asthma   . Contact lens/glasses fitting     wears contacts or glasses  . Primary localized osteoarthritis of right knee 11/25/2011    S/P Left Knee arthroscopy performed by Dr. Alvan Dame in April 2013.     Past Surgical History  Procedure Laterality Date  . Vaginal hysterectomy  03-18-1999  . Cholecystectomy  1992  . Appendectomy  1998  . Bilateral carpal tunnel release  1980'S  . Transthoracic echocardiogram  04-26-2009    NORMAL LVSF/ EF 60-65% / LVDF NORMAL / NO PERICARDIAL EFFUSION  . Knee arthroscopy  06/20/2011    Procedure: ARTHROSCOPY KNEE;  Surgeon: Mauri Pole, MD;  Location: [facility];  Service: Orthopedics;  Laterality: Left;  left knee scope   latex allergy patient blisters and swells  . Breast reduction surgery Bilateral 12/21/2012    Procedure: BILATERAL MAMMARY REDUCTION  (BREAST);  Surgeon: Crissie Reese, MD;  Location: [facility];  Service: Plastics;   Laterality: Bilateral;  . Partial knee arthroplasty Right 03/03/2014    Procedure: RIGHT UNICOMPARTMENTAL KNEE ARTHROPLASTY;  Surgeon: Johnny Bridge, MD;  Location: [facility];  Service: Orthopedics;  Laterality: Right;    There were no vitals filed for this visit.  Visit Diagnosis:  Knee stiffness, right  Muscle weakness of lower extremity  S/P right unicompartmental knee replacement  Abnormality of gait      Subjective Assessment - 06/29/14 1335    Subjective States she has been busy all day since doing a class at 7 AM.     Currently in Pain? No/denies   Pain Score 0-No pain   Pain Orientation Right   Pain Descriptors / Indicators --  stiff and swelling   Pain Type Surgical pain   Pain Onset More than a month ago   Pain Frequency Intermittent   Aggravating Factors  being up a long time on my feet   Pain Relieving Factors vasocompression            [facility] PT Assessment - 06/29/14 1338    Observation/Other Assessments   Focus on Therapeutic Outcomes (FOTO)  33% limit, improved from 70%   Posture/Postural Control   Posture Comments left 45 cm  left;  45 3/4 cm right   AROM   Right Knee Extension 5   Right Knee Flexion 120   Strength   Right Knee Flexion 4+/5  Right Knee Extension 4+/5                     [facility] Adult PT Treatment/Exercise - 06/30/14 0001    Ambulation/Gait   Gait velocity 1.2 m/sec no assistive device   Stairs --  reciprocal with 1 railing   Standardized Balance Assessment   Standardized Balance Assessment --   Timed Up and Go Test   TUG --  9 sec   Knee/Hip Exercises: Standing   Step Down Right;10 reps;Hand Hold: 1;Step Height: 4"   Knee/Hip Exercises: Seated   Other Seated Knee Exercises blue band knee extension 15x   Other Seated Knee Exercises seated terminal knee extensions 2x10   Cryotherapy   Number Minutes Cryotherapy 20 Minutes   Cryotherapy Location Knee   Type of Cryotherapy --  vasocompression 32  deg moderate   Manual Therapy   Joint Mobilization for knee flexion; knee extension mobs grade 3/4 3x 20 sec each;    Massage Soft tissue work distal quads, pt reports decreased pain.    Passive ROM seated and supine knee flexion and extension 10x each                PT Education - 06/30/14 0640    Education provided Yes   Education Details discussion of HEP safe self progression   Person(s) Educated Patient   Methods Explanation;Demonstration   Comprehension Verbalized understanding;Returned demonstration          PT Short Term Goals - 06/29/14 1358    PT SHORT TERM GOAL #1   Title independent with HEP (04/25/14)   Status Achieved   PT SHORT TERM GOAL #2   Title improve R knee active ROM 5-75 for improved mobility (04/25/14)   Status Achieved   PT SHORT TERM GOAL #3   Title ambulate > 150' with single point cane modified independent for improved mobility (04/25/14)   Status Achieved   PT SHORT TERM GOAL #4   Title report pain < 3/10 for improved function and decreased pain (04/25/14)   Status Achieved           PT Long Term Goals - 06/29/14 1358    PT LONG TERM GOAL #1   Title independent with advanced HEP (05/23/14)   Status Achieved   PT LONG TERM GOAL #2   Title improve R knee active ROM 0-110 for improved function and mobility (05/23/14)   Baseline 0 supine, 5 degrees in sitting   Status Partially Met   PT LONG TERM GOAL #3   Title report ability to ambulate > 30 min without increase in pain without device (05/23/14)   Status Achieved   PT LONG TERM GOAL #4   Title RLE circumferential measurement within 0.5 cm of LLE for decreased edema and improved function (05/23/14)   Baseline 3/4 cm difference   Status Partially Met               Plan - 06/30/14 2641    Clinical Impression Statement The patient made good progress with ROM progression following knee joint manipulation.  Her current knee AROM in supine 0-120 degrees, in sitting 5-120 degrees  secondary to weakness in quadriceps at endrange rather than stiffness.  She has been instructed in a HEP with focus on this motor control deficit.  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  She is able to go up and down steps reciprocally with moderate UE use of 1 handrail.  She has met the majority of goals.  Recommend discharge from PT at this time.          Problem List Patient Active Problem List   Diagnosis Date Noted  . Right knee pain 08/23/2013  . Left knee pain 06/02/2013  . Migraine 03/16/2012  . Obesity (BMI 30.0-34.9) 11/25/2011  . Allergic rhinitis 11/25/2011  . Primary localized osteoarthritis of right knee 11/25/2011  . History of viral pericarditis 11/25/2011    Alvera Singh 06/30/2014, 6:51 AM  [facility] [address] [place], [place], [address] Phone: [phone]   Fax:  [phone]   PHYSICAL THERAPY DISCHARGE SUMMARY  Visits from Start of Care: 30  Current functional level related to goals / functional outcomes: See clinical impressions above   Remaining deficits: See above.  Majority of goals met.   Education / Equipment: HEP Plan: Patient agrees to discharge.  Patient goals were met. Patient is being discharged due to meeting the stated rehab goals.  ?????      Ruben Im, PT 06/30/2014 6:53 AM Phone: [phone] Fax: [phone]

## 2014-11-02 IMAGING — CT CT HEAD W/O CM
2 series · 16 of 30 positions shown, 20 images · non-contrast
Comparison: None.

CLINICAL DATA: Headache

CT HEAD WITHOUT CONTRAST
TECHNIQUE: Contiguous axial images were obtained from the base of
the skull through the vertex without contrast.

[Series 2: head w/o · axial · non-contrast · 0.43mm/px · z∈[+670,+790]mm · 13 of 30 slices shown, 17 images]
[im 3/30  brain]
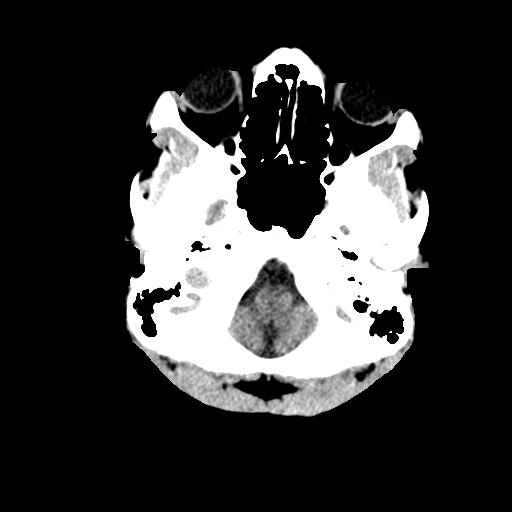
[im 3/30  bone]
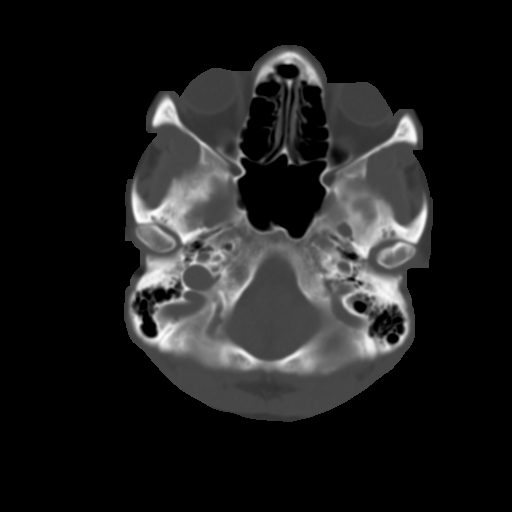
[im 5/30  brain]
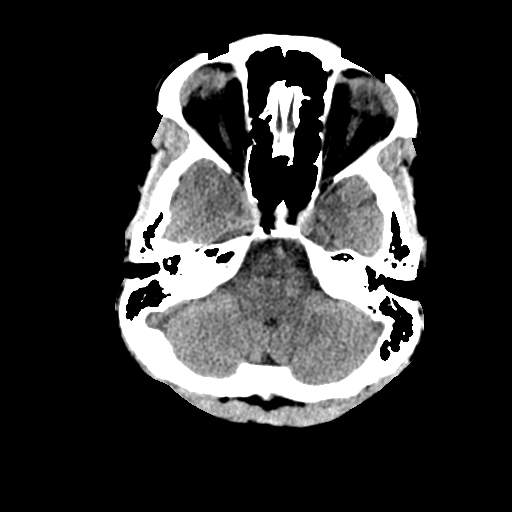
[im 7/30  brain]
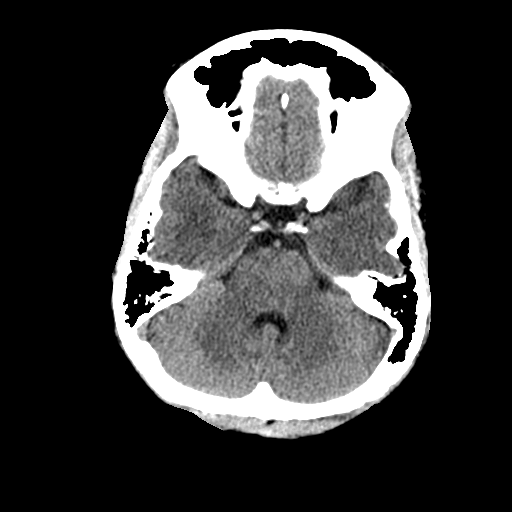
[im 9/30  brain]
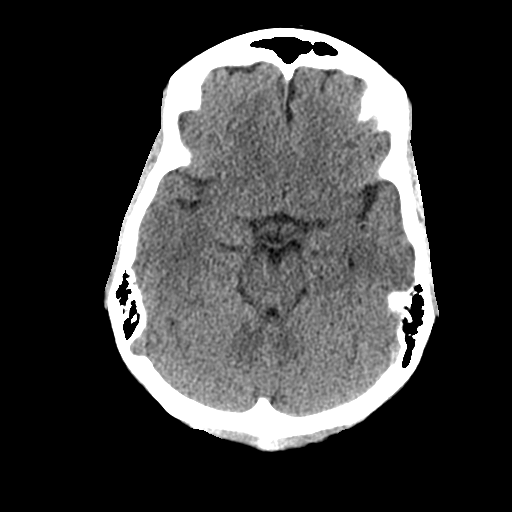
[im 11/30  brain]
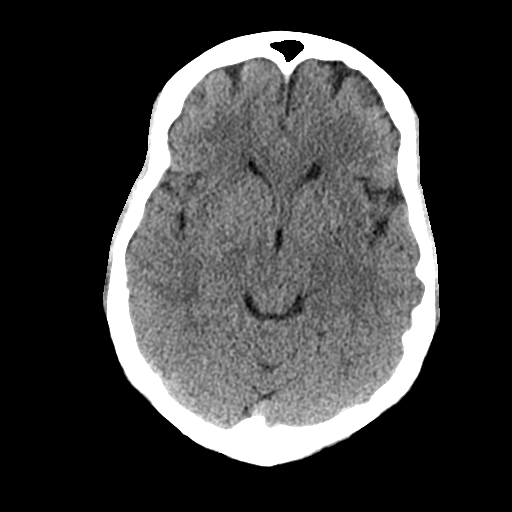
[im 11/30  bone]
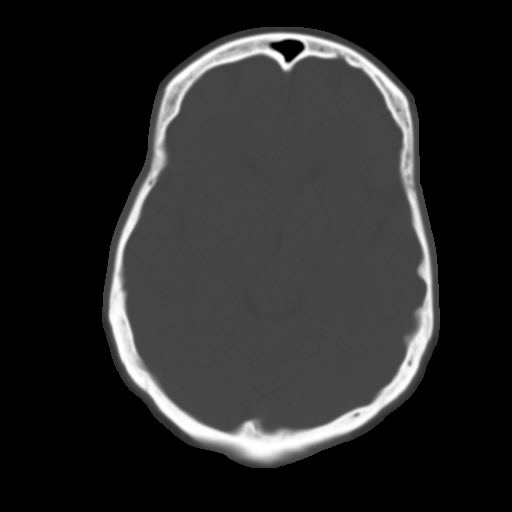
[im 13/30  brain]
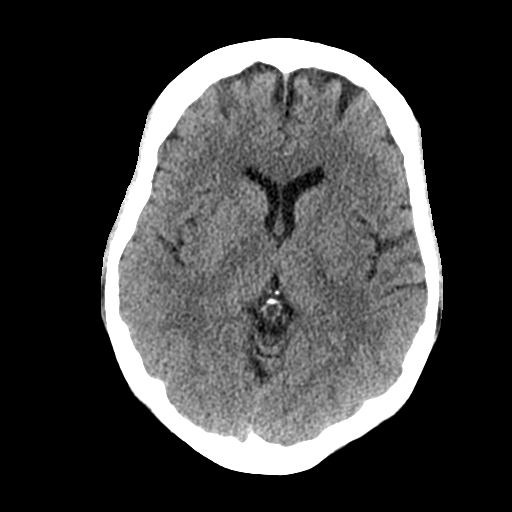
[im 15/30  brain]
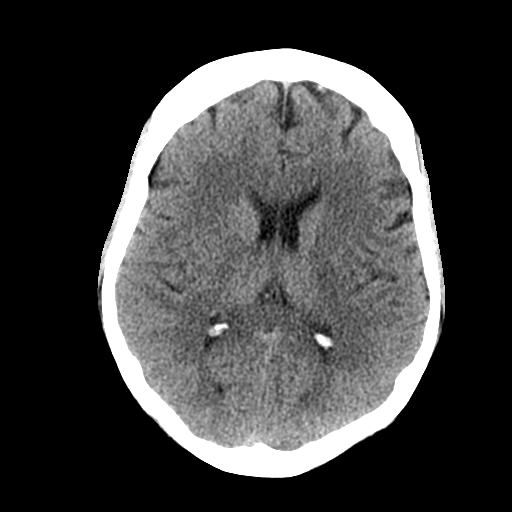
[im 17/30  brain]
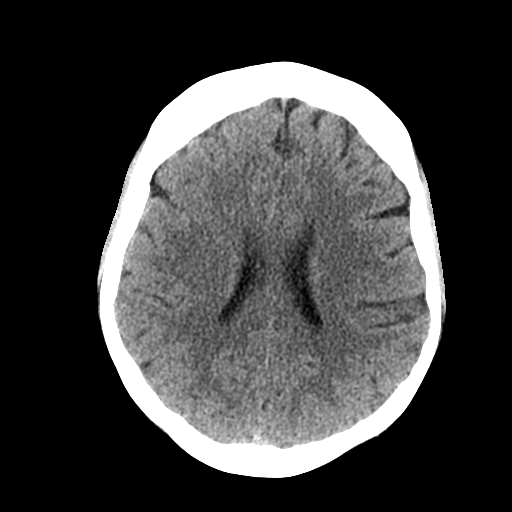
[im 19/30  brain]
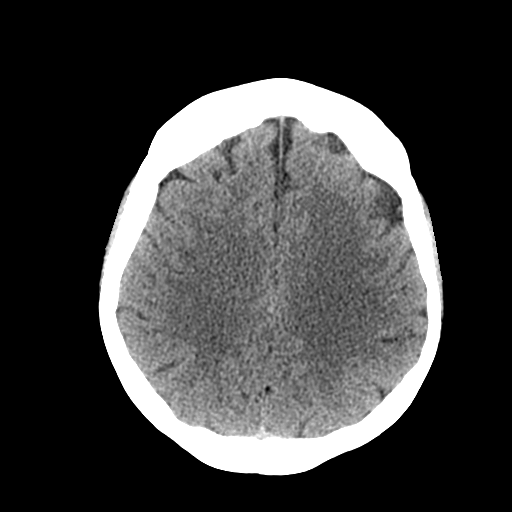
[im 19/30  bone]
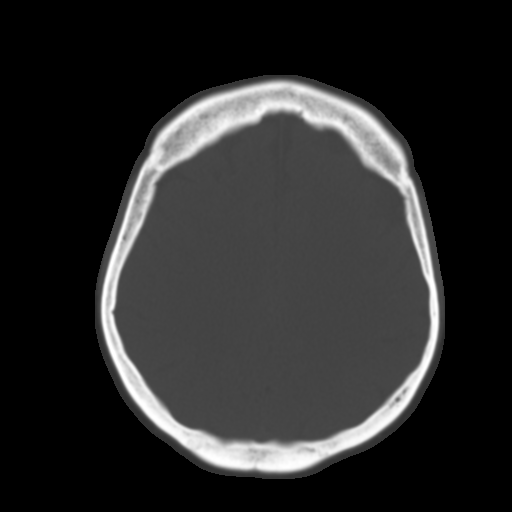
[im 21/30  brain]
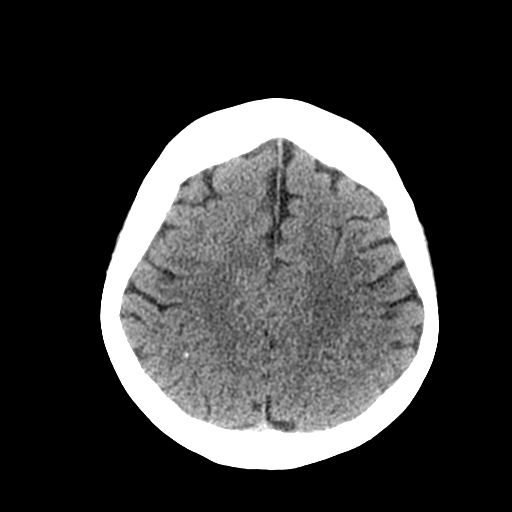
[im 23/30  brain]
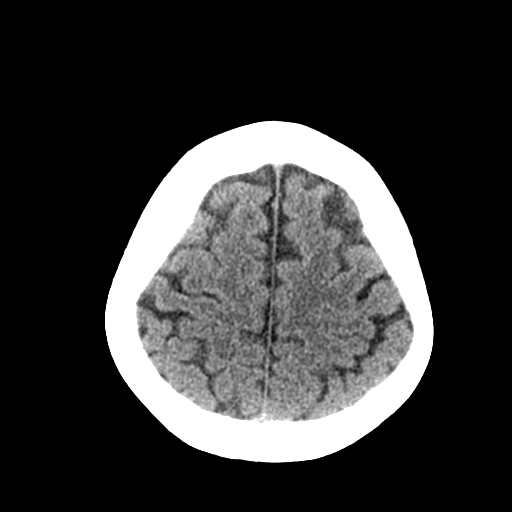
[im 25/30  brain]
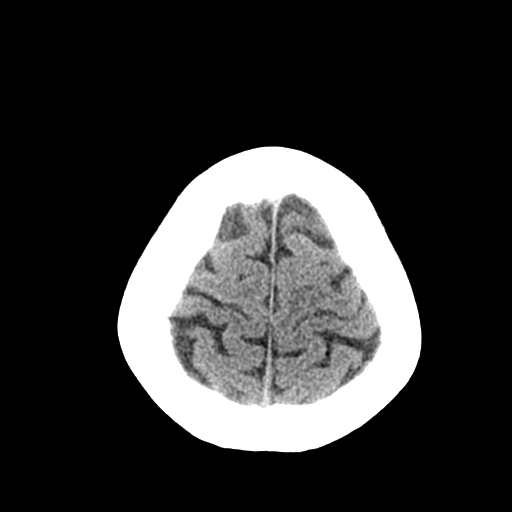
[im 27/30  brain]
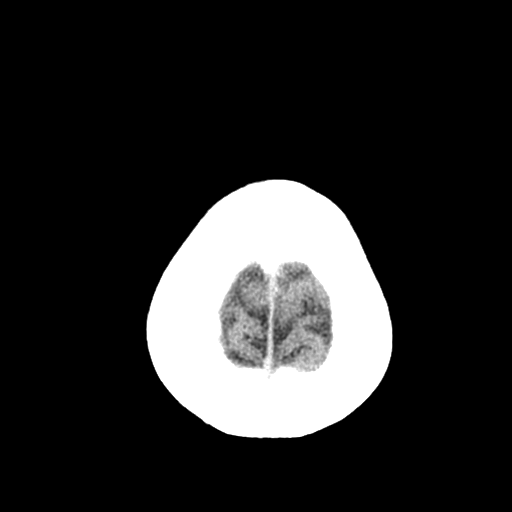
[im 27/30  bone]
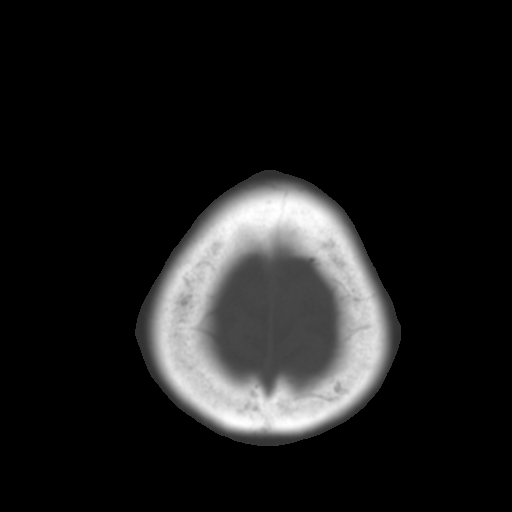

[Series 3: bone windows · axial · 0.43mm/px · z∈[+670,+710]mm · 3 of 30 slices shown]
[im 3/30  bone]
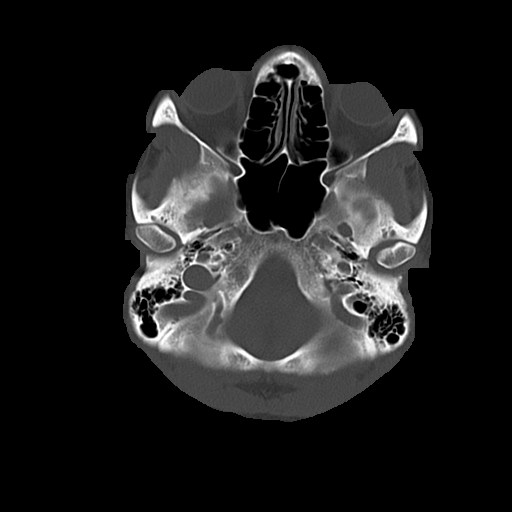
[im 7/30  bone]
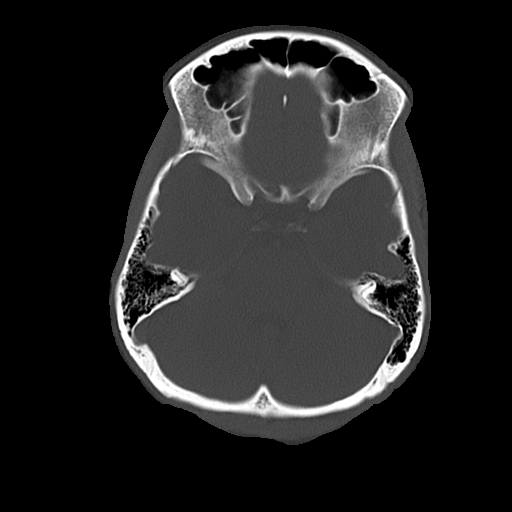
[im 11/30  bone]
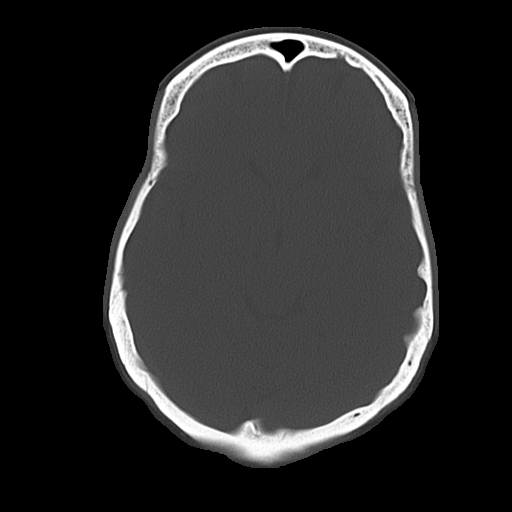

[16 of 30 positions shown; findings below may reference images not displayed]

FINDINGS: 3 mm   focal parenchymal calcification in the posterior
right frontal lobe.
There is no evidence of acute intracranial hemorrhage, brain edema,
mass lesion, acute infarction,   mass effect, or midline shift.
Acute infarct may be inapparent on noncontrast CT.  No other intra-
axial abnormalities are seen, and the ventricles and sulci are
within normal limits in size and symmetry.   No abnormal extra-
axial fluid collections or masses are identified.  No significant
calvarial abnormality.
IMPRESSION: 1. Negative for bleed or other acute intracranial process.

## 2015-02-28 ENCOUNTER — Encounter: Payer: Self-pay | Admitting: Family Medicine

## 2015-02-28 ENCOUNTER — Ambulatory Visit (INDEPENDENT_AMBULATORY_CARE_PROVIDER_SITE_OTHER): Payer: 59 | Admitting: Family Medicine

## 2015-02-28 VITALS — BP 122/72 | HR 76 | Ht 64.0 in | Wt 181.0 lb

## 2015-02-28 DIAGNOSIS — G5701 Lesion of sciatic nerve, right lower limb: Secondary | ICD-10-CM | POA: Diagnosis not present

## 2015-02-28 DIAGNOSIS — M9904 Segmental and somatic dysfunction of sacral region: Secondary | ICD-10-CM

## 2015-02-28 DIAGNOSIS — M9902 Segmental and somatic dysfunction of thoracic region: Secondary | ICD-10-CM

## 2015-02-28 DIAGNOSIS — M9903 Segmental and somatic dysfunction of lumbar region: Secondary | ICD-10-CM

## 2015-02-28 DIAGNOSIS — M999 Biomechanical lesion, unspecified: Secondary | ICD-10-CM

## 2015-02-28 NOTE — Progress Notes (Signed)
Pre visit review using our clinic review tool, if applicable. No additional management support is needed unless otherwise documented below in the visit note. 

## 2015-02-28 NOTE — Assessment & Plan Note (Signed)
Piriformis Syndrome  Using an anatomical model, reviewed with the patient the structures involved and how they related to diagnosis. The patient indicated understanding.   The patient was given a handout from Dr. Ailene Ardsouzier's book "The Sports Medicine Patient Advisor" describing the anatomy and rehabilitation of the following condition: Piriformis Syndrome  Also given a handout with more extensive Piriformis stretching, hip flexor and abductor strengthening, ham stretching  Rec deep massage, explained self-massage with ball Patient also responding very well to manipulation. Patient will come back and see me again in 3-4 weeks. I anticipate patient making fairly quick progress.

## 2015-02-28 NOTE — Assessment & Plan Note (Signed)
Decision today to treat with OMT was based on Physical Exam  After verbal consent patient was treated with HVLA, ME, FPR techniques in  thoracic, lumbar and sacral areas  Patient tolerated the procedure well with improvement in symptoms  Patient given exercises, stretches and lifestyle modifications  See medications in patient instructions if given  Patient will follow up in 3-4 weeks 

## 2015-02-28 NOTE — Progress Notes (Signed)
Dawn [name] [address] [place], [place] [address] Phone: [phone] Subjective:    I'm seeing this patient by the request  of:  POLITE,RONALD D, MD   CC: right hip pain   Dawn [name] is a 50 y.o. female coming in with complaint of right hip pain. Patient has had this pain for multiple months. Has come and gone before. Seems to be more constant this time. Discussed pain as a dull, throbbing aching sensation. Seems to be more on the lateral aspect as well as the proximal region. Patient recently did have a unicompartmental replacement of the medial compartment of the right knee. Pain worsen recently. Denies any nighttime awakening. Rates the severity of pain as 5 out of 10. Does not rib or any true injury.       Past Medical History  Diagnosis Date  . Acute meniscal tear of knee LEFT  . Seasonal allergies   . Environmental allergies   . History of gastric ulcer AS TEEN  . 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 Arthritis   . Asthma   . Contact lens/glasses fitting     wears contacts or glasses  . Primary localized osteoarthritis of right knee 11/25/2011    S/P Left Knee arthroscopy performed by Dr. Charlann Boxer in April 2013.    Past Surgical History  Procedure Laterality Date  . Vaginal hysterectomy  03-18-1999  . Cholecystectomy  1992  . Appendectomy  1998  . Bilateral carpal tunnel release  1980'S  . Transthoracic echocardiogram  04-26-2009    NORMAL LVSF/ EF 60-65% / LVDF NORMAL / NO PERICARDIAL EFFUSION  . Knee arthroscopy  06/20/2011    Procedure: ARTHROSCOPY KNEE;  Surgeon: Shelda Pal, MD;  Location: [facility];  Service: Orthopedics;  Laterality: Left;  left knee scope   latex allergy patient blisters and swells  . Breast reduction surgery Bilateral 12/21/2012    Procedure: BILATERAL MAMMARY REDUCTION  (BREAST);  Surgeon: Etter Sjogren,  MD;  Location: [facility];  Service: Plastics;  Laterality: Bilateral;  . Partial knee arthroplasty Right 03/03/2014    Procedure: RIGHT UNICOMPARTMENTAL KNEE ARTHROPLASTY;  Surgeon: Eulas Post, MD;  Location: [facility];  Service: Orthopedics;  Laterality: Right;   Social History   Social History  . Marital Status: Married    Spouse Name: N/A  . Number of Children: N/A  . Years of Education: N/A   Social History Main Topics  . Smoking status: Never Smoker   . Smokeless tobacco: Never Used  . Alcohol Use: No  . Drug Use: No  . Sexual Activity: Yes    Birth Control/ Protection: Surgical   Other Topics Concern  . None   Social History Narrative   Allergies  Allergen Reactions  . Latex Swelling    AND BLISTERS  . Sulfa Antibiotics Anaphylaxis   Family History  Problem Relation Age of Onset  . Cancer Brother   . Asthma Son     Past medical history, social, surgical and family history all reviewed in electronic medical record.  No pertanent information unless stated regarding to the chief complaint.   Review of Systems: No headache, visual changes, nausea, vomiting, diarrhea, constipation, dizziness, abdominal pain, skin rash, fevers, chills, night sweats, weight loss, swollen lymph nodes, body aches, joint swelling, muscle aches, chest pain, shortness of breath, mood changes.   Objective  Blood pressure 122/72, pulse 76, height 5\' 4"  (1.626 m), weight 181 lb (82.101 kg), SpO2 99 %.  General: No apparent distress alert and oriented x3 mood and affect normal, dressed appropriately.  HEENT: Pupils equal, extraocular movements intact  Respiratory: Patient's speak in full sentences and does not appear short of breath  Cardiovascular: No lower extremity edema, non tender, no erythema  Skin: Warm dry intact with no signs of infection or rash on extremities or on axial skeleton.  Abdomen: Soft nontender  Neuro: Cranial nerves II through XII are  intact, neurovascularly intact in all extremities with 2+ DTRs and 2+ pulses.  Lymph: No lymphadenopathy of posterior or anterior cervical chain or axillae bilaterally.  Gait normal with good balance and coordination.  MSK:  Non tender with full range of motion and good stability and symmetric strength and tone of shoulders, elbows, wrist,  knee and ankles bilaterally. Incision well-healed on patient's right knee  Hip: right ROM IR: 35 Deg, ER: 15 Deg, Flexion: 120 Deg, Extension: 100 Deg, Abduction: 45 Deg, Adduction: 45 Deg Strength IR: 5/5, ER: 5/5, Flexion: 5/5, Extension: 5/5, Abduction: 5/5, Adduction: 5/5 Pelvic alignment unremarkable to inspection and palpation. Standing hip rotation and gait without trendelenburg sign / unsteadiness. Greater trochanter without tenderness to palpation. Severe tenderness over the piriformis Positive Faber Mild tenderness of the sacroiliac joint Contralateral hip unremarkable  OMT findings T3 E RS right T7 E RS left L2 F RS left Sacrum left on left  Procedure note 97110; 15 minutes spent for Therapeutic exercises as stated in above notes.  This included exercises focusing on stretching, strengthening, with significant focus on eccentric aspects.Piriformis Syndrome  Using an anatomical model, reviewed with the patient the structures involved and how they related to diagnosis. The patient indicated understanding.   The patient was given a handout from Dr. Ailene Ardsouzier's book "The Sports [name] Patient Advisor" describing the anatomy and rehabilitation of the following condition: Piriformis Syndrome  Also given a handout with more extensive Piriformis stretching, hip flexor and abductor strengthening, ham stretching  Rec deep massage, explained self-massage with ball  Proper technique shown and discussed handout in great detail with ATC.  All questions were discussed and answered.     Impression and Recommendations:     This case required  medical decision making of moderate complexity.      Note: This dictation was prepared with Dragon dictation along with smaller phrase technology. Any transcriptional errors that result from this process are unintentional.

## 2015-02-28 NOTE — Patient Instructions (Signed)
Good to see you.  Tell Dawn [name] not as bad as her ;) Stay active but drop what you are doing by 20% and increase slowly Exercises on wall.  Heel and butt touching.  Raise leg 6 inches and hold 2 seconds.  Down slow for count of 4 seconds.  1 set of 30 reps daily on both sides.  Exercises 3 times a week.  Tennis ball in back right pocket with sitting.  This will take time but I think you will get better fairly quick See me again in 3-4 weeks.

## 2015-03-20 DIAGNOSIS — E663 Overweight: Secondary | ICD-10-CM | POA: Diagnosis not present

## 2015-03-20 DIAGNOSIS — Z0000 Encounter for general adult medical examination without abnormal findings: Secondary | ICD-10-CM | POA: Diagnosis not present

## 2015-03-20 DIAGNOSIS — Z6829 Body mass index (BMI) 29.0-29.9, adult: Secondary | ICD-10-CM | POA: Diagnosis not present

## 2015-03-21 ENCOUNTER — Ambulatory Visit (INDEPENDENT_AMBULATORY_CARE_PROVIDER_SITE_OTHER): Payer: 59 | Admitting: Family Medicine

## 2015-03-21 ENCOUNTER — Encounter: Payer: Self-pay | Admitting: Family Medicine

## 2015-03-21 VITALS — BP 118/82 | HR 81 | Wt 177.0 lb

## 2015-03-21 DIAGNOSIS — M9902 Segmental and somatic dysfunction of thoracic region: Secondary | ICD-10-CM

## 2015-03-21 DIAGNOSIS — G5701 Lesion of sciatic nerve, right lower limb: Secondary | ICD-10-CM

## 2015-03-21 DIAGNOSIS — M9903 Segmental and somatic dysfunction of lumbar region: Secondary | ICD-10-CM

## 2015-03-21 DIAGNOSIS — M9904 Segmental and somatic dysfunction of sacral region: Secondary | ICD-10-CM

## 2015-03-21 DIAGNOSIS — M999 Biomechanical lesion, unspecified: Secondary | ICD-10-CM

## 2015-03-21 NOTE — Assessment & Plan Note (Signed)
Making progress overall. We discussed home exercises, icing protocol, we discussed more core stability and the importance of hip abductor's. Patient is going to continue to make the improvements in the adjustments. Patient and will come back and see me again in 4 weeks.

## 2015-03-21 NOTE — Patient Instructions (Addendum)
Good to see you  Ice is your friend Stay active  DDP! Core and hip flexors are key Keep up with the ball Keep the left elbow below the left shoulder See me again in 4 weeks.

## 2015-03-21 NOTE — Progress Notes (Signed)
[name] [address] [place], [place] [address] Phone: [phone] Subjective:    I'm seeing this patient by the request  of:  POLITE,RONALD D, MD   CC: right hip pain  F/u   [id number] Dawn [name] is a 50 y.o. female coming in with complaint of right hip pain. Patient was found to have a right-sided piriformis syndrome. We didn't have some malalignment. Patient did respond fairly well to osteopathic manipulation. Patient was given home exercises, icing protocol, discussed which activities to do an which was potentially avoid. Patient states overall she has made improvement. Did have an exacerbation last week. States that now seems to be doing better. No radiation down the leg, no numbness. States that she is making progress. Patient has been doing a lot of different yoga and core stability exercises. States that she thinks this is helping. Would state that she is a proximal way 40% better.      Past Medical History  Diagnosis Date  . Acute meniscal tear of knee LEFT  . Seasonal allergies   . Environmental allergies   . History of gastric ulcer AS TEEN  . 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 Arthritis   . Asthma   . Contact lens/glasses fitting     wears contacts or glasses  . Primary localized osteoarthritis of right knee 11/25/2011    S/P Left Knee arthroscopy performed by Dr. Charlann Boxer in April 2013.    Past Surgical History  Procedure Laterality Date  . Vaginal hysterectomy  03-18-1999  . Cholecystectomy  1992  . Appendectomy  1998  . Bilateral carpal tunnel release  1980'S  . Transthoracic echocardiogram  04-26-2009    NORMAL LVSF/ EF 60-65% / LVDF NORMAL / NO PERICARDIAL EFFUSION  . Knee arthroscopy  06/20/2011    Procedure: ARTHROSCOPY KNEE;  Surgeon: Shelda Pal, MD;  Location: [facility];  Service: Orthopedics;  Laterality: Left;  left knee  scope   latex allergy patient blisters and swells  . Breast reduction surgery Bilateral 12/21/2012    Procedure: BILATERAL MAMMARY REDUCTION  (BREAST);  Surgeon: Etter Sjogren, MD;  Location: [facility];  Service: Plastics;  Laterality: Bilateral;  . Partial knee arthroplasty Right 03/03/2014    Procedure: RIGHT UNICOMPARTMENTAL KNEE ARTHROPLASTY;  Surgeon: Eulas Post, MD;  Location: [facility];  Service: Orthopedics;  Laterality: Right;   Social History   Social History  . Marital Status: Married    Spouse Name: N/A  . Number of Children: N/A  . Years of Education: N/A   Social History Main Topics  . Smoking status: Never Smoker   . Smokeless tobacco: Never Used  . Alcohol Use: No  . Drug Use: No  . Sexual Activity: Yes    Birth Control/ Protection: Surgical   Other Topics Concern  . None   Social History Narrative   Allergies  Allergen Reactions  . Latex Swelling    AND BLISTERS  . Sulfa Antibiotics Anaphylaxis   Family History  Problem Relation Age of Onset  . Cancer Brother   . Asthma Son     Past medical history, social, surgical and family history all reviewed in electronic medical record.  No pertanent information unless stated regarding to the chief complaint.   Review of Systems: No headache, visual changes, nausea, vomiting, diarrhea, constipation, dizziness, abdominal pain, skin rash, fevers, chills,  night sweats, weight loss, swollen lymph nodes, body aches, joint swelling, muscle aches, chest pain, shortness of breath, mood changes.   Objective Blood pressure 118/82, pulse 81, weight 177 lb (80.287 kg), SpO2 94 %.  General: No apparent distress alert and oriented x3 mood and affect normal, dressed appropriately.  HEENT: Pupils equal, extraocular movements intact  Respiratory: Patient's speak in full sentences and does not appear short of breath  Cardiovascular: No lower extremity edema, non tender, no erythema  Skin: Warm  dry intact with no signs of infection or rash on extremities or on axial skeleton.  Abdomen: Soft nontender  Neuro: Cranial nerves II through XII are intact, neurovascularly intact in all extremities with 2+ DTRs and 2+ pulses.  Lymph: No lymphadenopathy of posterior or anterior cervical chain or axillae bilaterally.  Gait normal with good balance and coordination.  MSK:  Non tender with full range of motion and good stability and symmetric strength and tone of shoulders, elbows, wrist,  knee and ankles bilaterally. Incision well-healed on patient's right knee  Hip: right ROM IR: 35 Deg, ER: 15 Deg, Flexion: 120 Deg, Extension: 100 Deg, Abduction: 45 Deg, Adduction: 45 Deg Strength IR: 5/5, ER: 5/5, Flexion: 5/5, Extension: 5/5, Abduction: 5/5, Adduction: 5/5 Pelvic alignment unremarkable to inspection and palpation. Standing hip rotation and gait without trendelenburg sign / unsteadiness. Greater trochanter without tenderness to palpation. Moderate tenderness over the piriformis but improvement from previous exam Positive Pearlean Brownie still present Mild tenderness of the sacroiliac joint Contralateral hip unremarkable  OMT findings T1 extended rotated and side bent left with elevated first rib T3 E RS right  T7 E RS left L2 F RS left Sacrum left on left     Impression and Recommendations:     This case required medical decision making of moderate complexity.      Note: This dictation was prepared with Dragon dictation along with smaller phrase technology. Any transcriptional errors that result from this process are unintentional.

## 2015-03-21 NOTE — Assessment & Plan Note (Signed)
Decision today to treat with OMT was based on Physical Exam  After verbal consent patient was treated with HVLA, ME, FPR techniques in  thoracic, lumbar and sacral areas  Patient tolerated the procedure well with improvement in symptoms  Patient given exercises, stretches and lifestyle modifications  See medications in patient instructions if given  Patient will follow up in 4 weeks 

## 2015-04-18 ENCOUNTER — Ambulatory Visit: Payer: Self-pay | Admitting: Family Medicine

## 2015-06-20 DIAGNOSIS — J310 Chronic rhinitis: Secondary | ICD-10-CM | POA: Diagnosis not present

## 2015-06-20 DIAGNOSIS — J4520 Mild intermittent asthma, uncomplicated: Secondary | ICD-10-CM | POA: Diagnosis not present

## 2015-06-20 DIAGNOSIS — H1045 Other chronic allergic conjunctivitis: Secondary | ICD-10-CM | POA: Diagnosis not present

## 2015-06-20 DIAGNOSIS — Z91018 Allergy to other foods: Secondary | ICD-10-CM | POA: Diagnosis not present

## 2015-06-20 MED FILL — LEVOCETIRIZINE 5 MG TABLET: 5 | 30 days supply | Qty: 30 | Fill #0

## 2015-06-20 MED FILL — FLUTICASONE PROP 50 MCG SPR: 50 | 30 days supply | Qty: 16 | Fill #0

## 2015-06-20 MED FILL — EPINEPHRINE 0.3 MG AUTO-INJ: 0.3 | 30 days supply | Qty: 2 | Fill #0

## 2015-06-20 MED FILL — PROAIR RESPICLICK INHAL PWD: 108 (90 BAS | 30 days supply | Qty: 1 | Fill #0

## 2015-06-22 DIAGNOSIS — H5203 Hypermetropia, bilateral: Secondary | ICD-10-CM | POA: Diagnosis not present

## 2015-06-22 DIAGNOSIS — H524 Presbyopia: Secondary | ICD-10-CM | POA: Diagnosis not present

## 2015-07-18 DIAGNOSIS — Z6829 Body mass index (BMI) 29.0-29.9, adult: Secondary | ICD-10-CM | POA: Diagnosis not present

## 2015-07-18 DIAGNOSIS — Z01419 Encounter for gynecological examination (general) (routine) without abnormal findings: Secondary | ICD-10-CM | POA: Diagnosis not present

## 2015-07-18 DIAGNOSIS — Z1231 Encounter for screening mammogram for malignant neoplasm of breast: Secondary | ICD-10-CM | POA: Diagnosis not present

## 2015-07-18 MED FILL — ESTRADIOL 1 MG TABLET: 1 | 90 days supply | Qty: 90 | Fill #0

## 2015-07-18 MED FILL — AMOXICILLIN 500 MG CAPSULE: 500 | 4 days supply | Qty: 16 | Fill #0

## 2015-09-05 DIAGNOSIS — J0110 Acute frontal sinusitis, unspecified: Secondary | ICD-10-CM | POA: Diagnosis not present

## 2015-09-05 MED FILL — LEVOCETIRIZINE 5 MG TABLET: 5 | 30 days supply | Qty: 30 | Fill #1

## 2015-09-05 MED FILL — AZITHROMYCIN 250 MG TABLET: 250 | 5 days supply | Qty: 6 | Fill #0

## 2016-01-24 MED FILL — LEVOCETIRIZINE 5 MG TABLET: 5 | 30 days supply | Qty: 30 | Fill #2

## 2016-03-20 DIAGNOSIS — E663 Overweight: Secondary | ICD-10-CM | POA: Diagnosis not present

## 2016-03-20 DIAGNOSIS — Z6832 Body mass index (BMI) 32.0-32.9, adult: Secondary | ICD-10-CM | POA: Diagnosis not present

## 2016-03-20 DIAGNOSIS — Z23 Encounter for immunization: Secondary | ICD-10-CM | POA: Diagnosis not present

## 2016-03-20 DIAGNOSIS — Z0000 Encounter for general adult medical examination without abnormal findings: Secondary | ICD-10-CM | POA: Diagnosis not present

## 2016-03-20 DIAGNOSIS — J45909 Unspecified asthma, uncomplicated: Secondary | ICD-10-CM | POA: Diagnosis not present

## 2016-04-23 DIAGNOSIS — F4323 Adjustment disorder with mixed anxiety and depressed mood: Secondary | ICD-10-CM | POA: Diagnosis not present

## 2016-04-29 ENCOUNTER — Ambulatory Visit: Payer: Self-pay

## 2016-04-29 ENCOUNTER — Encounter: Payer: Self-pay | Admitting: Family Medicine

## 2016-04-29 ENCOUNTER — Ambulatory Visit (INDEPENDENT_AMBULATORY_CARE_PROVIDER_SITE_OTHER): Payer: 59 | Admitting: Family Medicine

## 2016-04-29 VITALS — BP 132/78 | HR 84 | Ht 64.0 in | Wt 177.0 lb

## 2016-04-29 DIAGNOSIS — M25512 Pain in left shoulder: Secondary | ICD-10-CM

## 2016-04-29 DIAGNOSIS — M75112 Incomplete rotator cuff tear or rupture of left shoulder, not specified as traumatic: Secondary | ICD-10-CM | POA: Diagnosis not present

## 2016-04-29 MED ORDER — VITAMIN D (ERGOCALCIFEROL) 1.25 MG (50000 UNIT) PO CAPS: 50000.0000 [IU] | ORAL_CAPSULE | ORAL | 0 refills | Status: DC

## 2016-04-29 MED ORDER — NITROGLYCERIN 0.2 MG/HR TD PT24
MEDICATED_PATCH | TRANSDERMAL | 1 refills | Status: DC
Start: 2016-04-29 — End: 2016-04-29

## 2016-04-29 MED ORDER — NITROGLYCERIN 0.2 MG/HR TD PT24: MEDICATED_PATCH | TRANSDERMAL | 1 refills | Status: DC

## 2016-04-29 MED FILL — NITROGLYCERIN 0.2 MG/HR PTC: 0.2 | 90 days supply | Qty: 23 | Fill #0

## 2016-04-29 MED FILL — VIT D2 1.25 MG (50,000 UNIT: 1.25 MG | 56 days supply | Qty: 8 | Fill #0

## 2016-04-29 NOTE — Patient Instructions (Signed)
Good to see you.  Ice 20 minutes 2 times daily. Usually after activity and before bed. Exercises 3 times a week. Starting Friday No lifting otherwise ok for lower body.  Nitroglycerin Protocol   Apply 1/4 nitroglycerin patch to affected area daily.  Change position of patch within the affected area every 24 hours.  You may experience a headache during the first 1-2 weeks of using the patch, these should subside.  If you experience headaches after beginning nitroglycerin patch treatment, you may take your preferred over the counter pain reliever.  Another side effect of the nitroglycerin patch is skin irritation or rash related to patch adhesive.  Please notify our office if you develop more severe headaches or rash, and stop the patch.  Tendon healing with nitroglycerin patch may require 12 to 24 weeks depending on the extent of injury.  Men should not use if taking Viagra, Cialis, or Levitra.   Do not use if you have migraines or rosacea.   Once weekly vitamin D for 12 weeks due to the bone chip See me again in 2-3 weeks and we will make sure it is healing and get you doing more.

## 2016-04-29 NOTE — Assessment & Plan Note (Signed)
Acute tear. Partial articular side tear. Nitroglycerin in once weekly vitamin D prescribed. Home exercises given. Discussed icing regimen. Avoid any type of lifting until patient also began in the next 2-3 weeks.

## 2016-04-29 NOTE — Progress Notes (Signed)
Dawn [name] D.O. [facility] [address] [place], [place] [address] Phone: [phone] Subjective:    I'm seeing this patient by the request  of:  POLITE,RONALD D, MD   CC: left shoulder pain   BJY:[id number]:Subjective  [name] is a 51 y.o. female coming in with complaint of left shoulder pain.  Been hurting For 2 weeks. Patient states though that she was lifting a day and had a sharp pain and left shoulder movement or unfortunate drop away. Since it as a dull throbbing aching pain. Certain movements causes worsening symptoms. Patient denies any radiation down the arm or any significant weakness. Patient states though that it's been very difficult even concentrated at work second due to the pain. Has never injured this shoulder this been previously.     Past Medical History:  Diagnosis Date  . Acute meniscal tear of knee LEFT  . Arthritis   . Asthma   . Contact lens/glasses fitting    wears contacts or glasses  . Environmental allergies   . History of gastric ulcer AS TEEN  . History of viral pericarditis PROBABLE OR IDIOPATHIC PER D/C SUMMARY MARCH 2011   NO PROBLEMS SINCE  . Primary localized osteoarthritis of right knee 11/25/2011   S/P Left Knee arthroscopy performed by Dr. Charlann Boxerlin in April 2013.   . Seasonal allergies    Past Surgical History:  Procedure Laterality Date  . APPENDECTOMY  1998  . BILATERAL CARPAL TUNNEL RELEASE  1980'S  . BREAST REDUCTION SURGERY Bilateral 12/21/2012   Procedure: BILATERAL MAMMARY REDUCTION  (BREAST);  Surgeon: Etter Sjogrenavid Bowers, MD;  Location: [facility];  Service: Plastics;  Laterality: Bilateral;  . CHOLECYSTECTOMY  1992  . KNEE ARTHROSCOPY  06/20/2011   Procedure: ARTHROSCOPY KNEE;  Surgeon: Shelda PalMatthew D Olin, MD;  [facility];  Service: Orthopedics;  Laterality: Left;  left knee scope   latex allergy patient blisters and swells  . PARTIAL KNEE ARTHROPLASTY Right 03/03/2014   Procedure: RIGHT  UNICOMPARTMENTAL KNEE ARTHROPLASTY;  Surgeon: Eulas PostJoshua P Landau, MD;  Location: [facility];  Service: Orthopedics;  Laterality: Right;  . TRANSTHORACIC ECHOCARDIOGRAM  04-26-2009   NORMAL LVSF/ EF 60-65% / LVDF NORMAL / NO PERICARDIAL EFFUSION  . VAGINAL HYSTERECTOMY  03-18-1999   Social History   Social History  . Marital status: Married    Spouse name: N/A  . Number of children: N/A  . Years of education: N/A   Social History Main Topics  . Smoking status: Never Smoker  . Smokeless tobacco: Never Used  . Alcohol use No  . Drug use: No  . Sexual activity: Yes    Birth control/ protection: Surgical   Other Topics Concern  . None   Social History Narrative  . None   Allergies  Allergen Reactions  . Latex Swelling    AND BLISTERS  . Sulfa Antibiotics Anaphylaxis   Family History  Problem Relation Age of Onset  . Cancer Brother   . Asthma Son     Past medical history, social, surgical and family history all reviewed in electronic medical record.  No pertanent information unless stated regarding to the chief complaint.   Review of Systems:Review of systems updated and as accurate as of 04/29/16  No headache, visual changes, nausea, vomiting, diarrhea, constipation, dizziness, abdominal pain, skin rash, fevers, chills, night sweats, weight loss, swollen lymph nodes, body aches, joint swelling, muscle aches, chest pain, shortness of breath, mood changes.   Objective  Blood pressure 132/78, pulse 84, height 5\' 4"  (1.626 m), weight 177 lb (80.3 kg). Systems examined below as of 04/29/16   General: No apparent distress alert and oriented x3 mood and affect normal, dressed appropriately.  HEENT: Pupils equal, extraocular movements intact  Respiratory: Patient's speak in full sentences and does not appear short of breath  Cardiovascular: No lower extremity edema, non tender, no erythema  Skin: Warm dry intact with no signs of infection or rash on extremities or  on axial skeleton.  Abdomen: Soft nontender  Neuro: Cranial nerves II through XII are intact, neurovascularly intact in all extremities with 2+ DTRs and 2+ pulses.  Lymph: No lymphadenopathy of posterior or anterior cervical chain or axillae bilaterally.  Gait normal with good balance and coordination.  MSK:  Non tender with full range of motion and good stability and symmetric strength and tone of , elbows, wrist, hip, knee and ankles bilaterally.  Shoulder: left Inspection reveals no abnormalities, atrophy or asymmetry. Palpation is normal with no tenderness over AC joint or bicipital groove. ROM is full in all planes passively. Rotator cuff strength normal throughout. signs of impingement with positive Neer and Hawkin's tests, but negative empty can sign. Speeds and Yergason's tests normal. No labral pathology noted with negative Obrien's, negative clunk and good stability. Normal scapular function observed. No painful arc and no drop arm sign. No apprehension sign  MSK US performed of: left This study was ordered, performed, and interpreted by Terrilee Files D.O.  Shoulder:   Supraspinatus:  Articular side tear noted with what appears to be a small avulsion fracture. Hypoechoic changes and increasing Doppler flow. Subscapularis:  Appears normal on long and transverse views.  AC joint:  Capsule undistended, no geyser sign. Glenohumeral Joint:  Appears normal without effusion. Glenoid Labrum:  Intact without visualized tears. Biceps Tendon:  Appears normal on long and transverse views, no fraying of tendon, tendon located in intertubercular groove, no subluxation with shoulder internal or external rotation.  Impression: Partial supraspinatus tear with avulsion first possible spur formation.  Procedure note 97110; 15 minutes spent for Therapeutic exercises as stated in above notes.  This included exercises focusing on stretching, strengthening, with significant focus on eccentric  aspects.  Shoulder Exercises that included:  Basic scapular stabilization to include adduction and depression of scapula Scaption, focusing on proper movement and good control Internal and External rotation utilizing a theraband, with elbow tucked at side entire time Rows with theraband   Proper technique shown and discussed handout in great detail with ATC.  All questions were discussed and answered.     Impression and Recommendations:     This case required medical decision making of moderate complexity.      Note: This dictation was prepared with Dragon dictation along with smaller phrase technology. Any transcriptional errors that result from this process are unintentional.

## 2016-05-01 ENCOUNTER — Ambulatory Visit: Payer: Self-pay | Admitting: Family Medicine

## 2016-05-05 DIAGNOSIS — F4323 Adjustment disorder with mixed anxiety and depressed mood: Secondary | ICD-10-CM | POA: Diagnosis not present

## 2016-05-13 ENCOUNTER — Encounter: Payer: Self-pay | Admitting: Family Medicine

## 2016-05-13 ENCOUNTER — Ambulatory Visit (INDEPENDENT_AMBULATORY_CARE_PROVIDER_SITE_OTHER)
Admission: RE | Admit: 2016-05-13 | Discharge: 2016-05-13 | Disposition: A | Discharge status: Home / Self Care | Payer: 59 | Source: Ambulatory Visit | Attending: Family Medicine | Admitting: Family Medicine

## 2016-05-13 ENCOUNTER — Ambulatory Visit: Payer: Self-pay

## 2016-05-13 ENCOUNTER — Ambulatory Visit (INDEPENDENT_AMBULATORY_CARE_PROVIDER_SITE_OTHER): Payer: 59 | Admitting: Family Medicine

## 2016-05-13 VITALS — BP 120/82 | HR 73 | Ht 64.0 in | Wt 179.4 lb

## 2016-05-13 DIAGNOSIS — M25512 Pain in left shoulder: Secondary | ICD-10-CM

## 2016-05-13 DIAGNOSIS — M50322 Other cervical disc degeneration at C5-C6 level: Secondary | ICD-10-CM | POA: Diagnosis not present

## 2016-05-13 DIAGNOSIS — M75112 Incomplete rotator cuff tear or rupture of left shoulder, not specified as traumatic: Secondary | ICD-10-CM

## 2016-05-13 DIAGNOSIS — M50321 Other cervical disc degeneration at C4-C5 level: Secondary | ICD-10-CM | POA: Diagnosis not present

## 2016-05-13 MED ORDER — GABAPENTIN 100 MG PO CAPS: 200.0000 mg | ORAL_CAPSULE | Freq: Every day | ORAL | 3 refills | Status: DC

## 2016-05-13 MED FILL — GABAPENTIN 100 MG CAPSULE: 100 | 30 days supply | Qty: 60 | Fill #0

## 2016-05-13 NOTE — Progress Notes (Signed)
[name] [address] [place], [place] [address] Phone: [phone] Subjective:    I'm seeing this patient by the request  of:  POLITE,RONALD D, MD   CC: left shoulder pain f/u   [id number]  Dawn [name] is a 51 y.o. female coming in with complaint of left shoulder pain.  Patient was found to have a rotator cuff tear. Patient has been doing the exercises regularly. Has been doing icing, home exercises, which activities to do in which ones to avoid. Patient states though that she is only made some mild improvement. States that the pain seems to be still a dull throbbing aching sensation.     Past Medical History:  Diagnosis Date  . Acute meniscal tear of knee LEFT  . Arthritis   . Asthma   . Contact lens/glasses fitting    wears contacts or glasses  . Environmental allergies   . History of gastric ulcer AS TEEN  . History of viral pericarditis PROBABLE OR IDIOPATHIC PER D/C SUMMARY MARCH 2011   NO PROBLEMS SINCE  . Primary localized osteoarthritis of right knee 11/25/2011   S/P Left Knee arthroscopy performed by Dr. Charlann Boxer in April 2013.   . Seasonal allergies    Past Surgical History:  Procedure Laterality Date  . APPENDECTOMY  1998  . BILATERAL CARPAL TUNNEL RELEASE  1980'S  . BREAST REDUCTION SURGERY Bilateral 12/21/2012   Procedure: BILATERAL MAMMARY REDUCTION  (BREAST);  Surgeon: Etter Sjogren, MD;  Location: [facility];  Service: Plastics;  Laterality: Bilateral;  . CHOLECYSTECTOMY  1992  . KNEE ARTHROSCOPY  06/20/2011   Procedure: ARTHROSCOPY KNEE;  Surgeon: Shelda Pal, MD;  Location: [facility];  Service: Orthopedics;  Laterality: Left;  left knee scope   latex allergy patient blisters and swells  . PARTIAL KNEE ARTHROPLASTY Right 03/03/2014   Procedure: RIGHT UNICOMPARTMENTAL KNEE ARTHROPLASTY;  Surgeon: Eulas Post, MD;  Location: [facility];  Service: Orthopedics;   Laterality: Right;  . TRANSTHORACIC ECHOCARDIOGRAM  04-26-2009   NORMAL LVSF/ EF 60-65% / LVDF NORMAL / NO PERICARDIAL EFFUSION  . VAGINAL HYSTERECTOMY  03-18-1999   Social History   Social History  . Marital status: Married    Spouse name: N/A  . Number of children: N/A  . Years of education: N/A   Social History Main Topics  . Smoking status: Never Smoker  . Smokeless tobacco: Never Used  . Alcohol use No  . Drug use: No  . Sexual activity: Yes    Birth control/ protection: Surgical   Other Topics Concern  . None   Social History Narrative  . None   Allergies  Allergen Reactions  . Latex Swelling    AND BLISTERS  . Sulfa Antibiotics Anaphylaxis   Family History  Problem Relation Age of Onset  . Cancer Brother   . Asthma Son     Past medical history, social, surgical and family history all reviewed in electronic medical record.  No pertanent information unless stated regarding to the chief complaint.   Review of Systems: No headache, visual changes, nausea, vomiting, diarrhea, constipation, dizziness, abdominal pain, skin rash, fevers, chills, night sweats, weight loss, swollen lymph nodes, body aches, joint swelling, muscle aches, chest pain, shortness of breath, mood changes.    Objective  Blood pressure 120/82, pulse 73, height 5\' 4"  (1.626 m), weight 179 lb 6.4 oz (81.4 kg), SpO2 99 %.   Systems examined below as of 05/13/16  General: NAD A&O x3 mood, affect normal  HEENT: Pupils equal, extraocular movements intact no nystagmus Respiratory: not short of breath at rest or with speaking Cardiovascular: No lower extremity edema, non tender Skin: Warm dry intact with no signs of infection or rash on extremities or on axial skeleton. Abdomen: Soft nontender, no masses Neuro: Cranial nerves  intact, neurovascularly intact in all extremities with 2+ DTRs and 2+ pulses. Lymph: No lymphadenopathy appreciated today  Gait normal with good balance and coordination.    MSK: Non tender with full range of motion and good stability and symmetric strength and tone of  elbows, wrist,  knee hips and ankles bilaterally.   Shoulder: left Inspection reveals no abnormalities, atrophy or asymmetry. Palpation is normal with no tenderness over AC joint or bicipital groove. ROM is full in all planes passively. Rotator cuff strength 4+ out of 5. signs of impingement with positive Neer and Hawkin's tests, but negative empty can sign. Speeds and Yergason's tests normal. No labral pathology noted with negative Obrien's, negative clunk and good stability. Normal scapular function observed. No painful arc and no drop arm sign. No apprehension sign Contralateral shoulder unremarkable  Procedure: Real-time Ultrasound Guided Injection of left glenohumeral joint Device: GE Logiq E  Ultrasound guided injection is preferred based studies that show increased duration, increased effect, greater accuracy, decreased procedural pain, increased response rate with ultrasound guided versus blind injection.  Verbal informed consent obtained.  Time-out conducted.  Noted no overlying erythema, induration, or other signs of local infection.  Skin prepped in a sterile fashion.  Local anesthesia: Topical Ethyl chloride.  With sterile technique and under real time ultrasound guidance:  Joint visualized.  21g 2 inch needle inserted posterior approach. Pictures taken for needle placement. Patient did have injection of 2 cc of 0.5% Marcaine, and 1cc of Kenalog 40 mg/dL. Completed without difficulty  Pain immediately resolved suggesting accurate placement of the medication.  Advised to call if fevers/chills, erythema, induration, drainage, or persistent bleeding.  Images permanently stored and available for review in the ultrasound unit.  Impression: Technically successful ultrasound guided injection. d.     Impression and Recommendations:     This case required medical decision making of  moderate complexity.      Note: This dictation was prepared with Dragon dictation along with smaller phrase technology. Any transcriptional errors that result from this process are unintentional.

## 2016-05-13 NOTE — Assessment & Plan Note (Signed)
Patient had very minimal improvement previously. I do believe the patient had even maybe some mild increase in weakness today. Patient will have x-ray of the shoulder. Patient did have an injection today and tolerated the procedure well. We discussed that worsening symptoms we'll consider icing regimen, we discussed formal physical therapy. Patient's will come back and see me again in 3 weeks. Any worsening weakness patient could also need advance imaging.

## 2016-05-13 NOTE — Patient Instructions (Addendum)
Good to see you  Dawn [name] is your friend.   injected today  OK to lift but 20-30% of weight and increase 10% a week.  Continue the nitro Xray downstairs today  Gabapentin 200mg  at night See me again in 3 weeks.

## 2016-05-15 ENCOUNTER — Encounter (INDEPENDENT_AMBULATORY_CARE_PROVIDER_SITE_OTHER): Payer: 59 | Admitting: Family Medicine

## 2016-05-20 ENCOUNTER — Ambulatory Visit (INDEPENDENT_AMBULATORY_CARE_PROVIDER_SITE_OTHER): Payer: 59 | Admitting: Family Medicine

## 2016-05-20 ENCOUNTER — Encounter (INDEPENDENT_AMBULATORY_CARE_PROVIDER_SITE_OTHER): Payer: Self-pay | Admitting: Family Medicine

## 2016-05-20 VITALS — BP 126/77 | HR 62 | Temp 97.7°F | Ht 63.0 in | Wt 173.0 lb

## 2016-05-20 DIAGNOSIS — Z1389 Encounter for screening for other disorder: Secondary | ICD-10-CM | POA: Diagnosis not present

## 2016-05-20 DIAGNOSIS — Z9189 Other specified personal risk factors, not elsewhere classified: Secondary | ICD-10-CM | POA: Diagnosis not present

## 2016-05-20 DIAGNOSIS — R5383 Other fatigue: Secondary | ICD-10-CM

## 2016-05-20 DIAGNOSIS — J4520 Mild intermittent asthma, uncomplicated: Secondary | ICD-10-CM

## 2016-05-20 DIAGNOSIS — E559 Vitamin D deficiency, unspecified: Secondary | ICD-10-CM

## 2016-05-20 DIAGNOSIS — Z1331 Encounter for screening for depression: Secondary | ICD-10-CM

## 2016-05-20 DIAGNOSIS — E669 Obesity, unspecified: Secondary | ICD-10-CM | POA: Diagnosis not present

## 2016-05-20 DIAGNOSIS — Z6830 Body mass index (BMI) 30.0-30.9, adult: Secondary | ICD-10-CM

## 2016-05-20 DIAGNOSIS — Z0289 Encounter for other administrative examinations: Secondary | ICD-10-CM

## 2016-05-20 NOTE — Progress Notes (Signed)
Office: [phone]  /  Fax: [phone]   HPI:   Chief Complaint: OBESITY  Dawn [name] (MR# [id number]) is a 51 y.o. female who presents on 05/20/2016 for obesity evaluation and treatment. Current BMI is Body mass index is 30.65 kg/m.Marland Kitchen Dawn [name] has struggled with obesity for years and has been unsuccessful in either losing weight or maintaining long term weight loss. Dawn [name] attended our information session and states she is currently in the action stage of change and ready to dedicate time achieving and maintaining a healthier weight.  Dawn [name] states her family eats meals together she thinks her family will eat healthier with  her her desired weight loss is 36 lbs she started gaining weight 10 years ago her heaviest weight ever was 192 lbs. she skips meals frequently she is frequently drinking liquids with calories she frequently makes poor food choices   Dawn [name] feels her energy is lower than it should be. This has worsened with weight gain and has not worsened recently. Dawn [name] admits to daytime somnolence and  admits to waking up still tired. Patient is at risk for obstructive sleep apnea. Patent has a history of symptoms of daytime Dawn. Patient generally gets 4 or 5 hours of sleep per night, and states they generally have restless sleep. Snoring is not present. Apneic episodes are not present. Epworth Sleepiness Score is 10   Vitamin D deficiency Dawn [name] has a diagnosis of vitamin D deficiency. She is currently taking vit D, no recent labs available. She still notes Dawn and denies nausea, vomiting or muscle weakness.  Asthma Mild Intermittent Dawn [name] uses inhaler approximately 1 time per month. She is able to exercise without wheezing.  At risk for cardiovascular disease Dawn [name] is at a higher than average risk for cardiovascular disease due to obesity. She currently denies any chest pain.  Depression Screen Dawn [name]'s Food and Mood  (modified PHQ-9) score was  Depression screen PHQ 2/9 05/20/2016  Decreased Interest 1  Down, Depressed, Hopeless 0  PHQ - 2 Score 1  Altered sleeping 1  Tired, decreased energy 1  Change in appetite 1  Feeling bad or failure about yourself  0  Trouble concentrating 0  Moving slowly or fidgety/restless 0  Suicidal thoughts 0  PHQ-9 Score 4    ALLERGIES: Allergies  Allergen Reactions  . Coconut Oil Anaphylaxis, Shortness Of Breath and Swelling    Coconut  . Latex Swelling    AND BLISTERS  . Sulfa Antibiotics Anaphylaxis    MEDICATIONS: Current Outpatient Prescriptions on File Prior to Visit  Medication Sig Dispense Refill  . albuterol (PROVENTIL HFA;VENTOLIN HFA) 108 (90 BASE) MCG/ACT inhaler Inhale 2 puffs into the lungs every 6 (six) hours as needed for wheezing.    Marland Kitchen b complex vitamins tablet Take 1 tablet by mouth daily.    Marland Kitchen gabapentin (NEURONTIN) 100 MG capsule Take 2 capsules (200 mg total) by mouth at bedtime. 60 capsule 3  . Multiple Vitamins-Minerals (MULTIVITAMIN WITH MINERALS) tablet Take by mouth daily.     . nitroGLYCERIN (NITRODUR - DOSED IN MG/24 HR) 0.2 mg/hr patch 1/4 patch daily 30 patch 1  . Vitamin D, Ergocalciferol, (DRISDOL) 50000 units CAPS capsule Take 1 capsule (50,000 Units total) by mouth every 7 (seven) days. 8 capsule 0   No current facility-administered medications on file prior to visit.     PAST MEDICAL HISTORY: Past Medical History:  Diagnosis Date  . Acute meniscal tear of knee LEFT  . Arthritis   . Asthma   .  Constipation   . Contact lens/glasses fitting    wears contacts or glasses  . Environmental allergies   . Gallbladder problem   . History of gastric ulcer AS TEEN  . History of stomach ulcers   . History of viral pericarditis PROBABLE OR IDIOPATHIC PER D/C SUMMARY MARCH 2011   NO PROBLEMS SINCE  . Joint pain   . Lactose intolerance   . Multiple food allergies    coconut  . Osteoarthritis   . Pericarditis   . Primary  localized osteoarthritis of right knee 11/25/2011   S/P Left Knee arthroscopy performed by Dr. Charlann Boxer in April 2013.   . Seasonal allergies   . Torn rotator cuff     PAST SURGICAL HISTORY: Past Surgical History:  Procedure Laterality Date  . APPENDECTOMY  1998  . BILATERAL CARPAL TUNNEL RELEASE  1980'S  . BREAST REDUCTION SURGERY Bilateral 12/21/2012   Procedure: BILATERAL MAMMARY REDUCTION  (BREAST);  Surgeon: Etter Sjogren, MD;  Location: [facility];  Service: Plastics;  Laterality: Bilateral;  . CHOLECYSTECTOMY  1992  . KNEE ARTHROSCOPY  06/20/2011   Procedure: ARTHROSCOPY KNEE;  Surgeon: Shelda Pal, MD;  Location: [facility];  Service: Orthopedics;  Laterality: Left;  left knee scope   latex allergy patient blisters and swells  . PARTIAL KNEE ARTHROPLASTY Right 03/03/2014   Procedure: RIGHT UNICOMPARTMENTAL KNEE ARTHROPLASTY;  Surgeon: Eulas Post, MD;  Location: [facility];  Service: Orthopedics;  Laterality: Right;  . TRANSTHORACIC ECHOCARDIOGRAM  04-26-2009   NORMAL LVSF/ EF 60-65% / LVDF NORMAL / NO PERICARDIAL EFFUSION  . VAGINAL HYSTERECTOMY  03-18-1999    SOCIAL HISTORY: Social History  Substance Use Topics  . Smoking status: Never Smoker  . Smokeless tobacco: Never Used  . Alcohol use No    FAMILY HISTORY: Family History  Problem Relation Age of Onset  . Cancer Brother   . Asthma Son     ROS: Review of Systems  Constitutional: Positive for malaise/Dawn.  Cardiovascular: Negative for chest pain and orthopnea.  Gastrointestinal: Negative for nausea and vomiting.  Musculoskeletal:       Negative muscle weakness     PHYSICAL EXAM: Blood pressure 126/77, pulse 62, temperature 97.7 F (36.5 C), temperature source Oral, height 5\' 3"  (1.6 m), weight 173 lb (78.5 kg), SpO2 100 %. Body mass index is 30.65 kg/m. Physical Exam  Constitutional: She is oriented to person, place, and time. She appears  well-developed and well-nourished.  Cardiovascular: Normal rate.   Pulmonary/Chest: Effort normal.  Musculoskeletal: Normal range of motion.  Neurological: She is oriented to person, place, and time.  Skin: Skin is warm and dry.  Psychiatric: She has a normal mood and affect. Her behavior is normal.  Vitals reviewed.   RECENT LABS AND TESTS: BMET    Component Value Date/Time   NA 139 03/09/2012 1915   K 3.7 03/09/2012 1915   CL 102 03/09/2012 1915   CO2 28 03/09/2012 1915   GLUCOSE 89 03/09/2012 1915   BUN 20 03/09/2012 1915   CREATININE 0.67 03/09/2012 1915   CALCIUM 9.8 03/09/2012 1915   GFRNONAA >90 03/09/2012 1915   GFRAA >90 03/09/2012 1915   No results found for: HGBA1C No results found for: INSULIN CBC    Component Value Date/Time   WBC 11.5 (H) 03/09/2012 1915   RBC 4.32 03/09/2012 1915   HGB 12.8 03/03/2014 1252   HCT 39.7 03/09/2012 1915   PLT 259 03/09/2012 1915   MCV  91.9 03/09/2012 1915   MCV 95.9 12/22/2011 1227   MCH 31.3 03/09/2012 1915   MCHC 34.0 03/09/2012 1915   RDW 13.2 03/09/2012 1915   LYMPHSABS 2.2 03/09/2012 1915   MONOABS 0.6 03/09/2012 1915   EOSABS 0.1 03/09/2012 1915   BASOSABS 0.0 03/09/2012 1915   Iron/TIBC/Ferritin/ %Sat No results found for: IRON, TIBC, FERRITIN, IRONPCTSAT Lipid Panel  No results found for: CHOL, TRIG, HDL, CHOLHDL, VLDL, LDLCALC, LDLDIRECT Hepatic Function Panel     Component Value Date/Time   PROT 8.1 04/25/2009 2312   ALBUMIN 4.2 04/25/2009 2312   AST 26 04/25/2009 2312   ALT 24 04/25/2009 2312   ALKPHOS 66 04/25/2009 2312   BILITOT 0.6 04/25/2009 2312   No results found for: TSH  ECG  shows NSR with a rate of 63 BPM INDIRECT CALORIMETER done today shows a VO2 of 157 and a REE of 1093.    ASSESSMENT AND PLAN: Other Dawn - Plan: EKG 12-Lead, Comprehensive metabolic panel, CBC with Differential/Platelet, Hemoglobin A1c, Insulin, random, Lipid Panel With LDL/HDL Ratio, Vitamin B12, Folate, TSH,  T4, free, T3  SOB (shortness of breath) on exertion  Mild intermittent asthma, unspecified whether complicated  Vitamin D deficiency - Plan: VITAMIN D 25 Hydroxy (Vit-D Deficiency, Fractures)  Depression screening  Class 1 obesity without serious comorbidity with body mass index (BMI) of 30.0 to 30.9 in adult, unspecified obesity type  PLAN:  Dawn Chesni was informed that her Dawn may be related to obesity, depression or many other causes. Labs will be ordered, and in the meanwhile Dawn [name] has agreed to work on diet, exercise and weight loss to help with Dawn. Proper sleep hygiene was discussed including the need for 7-8 hours of quality sleep each night. A sleep study was not ordered based on symptoms and Epworth score.   Vitamin D Deficiency Dawn [name] was informed that low vitamin D levels contributes to Dawn and are associated with obesity, breast, and colon cancer. She agrees to continue to take prescription Vit D @50 ,000 IU every week and we will check labs and will follow up for routine testing of vitamin D, at least 2-3 times per year. She was informed of the risk of over-replacement of vitamin D and agrees to not increase her dose unless he discusses this with [place] first. Dawn [name] agrees to follow up with our clinic in 2 weeks.  Asthma Mild Intermittent  Cardiovascular risk counselling Dawn [name] was given extended (at least 15 minutes) coronary artery disease prevention counseling today. She is 51 y.o. female and has risk factors for heart disease including obesity. We discussed intensive lifestyle modifications today with an emphasis on specific weight loss instructions and strategies. Pt was also informed of the importance of increasing exercise and decreasing saturated fats to help prevent heart disease.  Depression Screen Dawn [name] had a negative depression screening. Depression is commonly associated with obesity and often results in emotional eating behaviors.  We will monitor this closely and work on CBT to help improve the non-hunger eating patterns. Referral to Psychology may be required if no improvement is seen as she continues in our clinic.  Obesity Dawn [name] is currently in the action stage of change and her goal is to continue with weight loss efforts She has agreed to portion control better and make smarter food choices, such as increase vegetables and decrease simple carbohydrates  and follow the Category 1 plan Dawn [name] has been instructed to work up to a goal of 150 minutes of combined cardio and strengthening  exercise per week for weight loss and overall health benefits. We discussed the following Behavioral Modification Stratagies today: increasing lean protein intake, decreasing simple carbohydrates , decrease eating out and emotional eating strategies  Dawn [name] has agreed to follow up with our clinic in 2 weeks. She was informed of the importance of frequent follow up visits to maximize her success with intensive lifestyle modifications for her multiple health conditions. She was informed we would discuss her lab results at her next visit unless there is a critical issue that needs to be addressed sooner. Dawn [name] agreed to keep her next visit at the agreed upon time to discuss these results.  I, [place] CraneJoanne Murray, am acting as scribe for Quillian Quincearen Kennette Cuthrell, MD  I have reviewed the above documentation for accuracy and completeness, and I agree with the above. -Quillian Quincearen Adael Culbreath, MD

## 2016-05-21 LAB — VITAMIN D 25 HYDROXY (VIT D DEFICIENCY, FRACTURES): Vit D, 25-Hydroxy: 48.2 ng/mL (ref 30.0–100.0)

## 2016-05-21 LAB — COMPREHENSIVE METABOLIC PANEL
ALBUMIN: 4.6 g/dL (ref 3.5–5.5)
ALT: 19 IU/L (ref 0–32)
AST: 18 IU/L (ref 0–40)
Albumin/Globulin Ratio: 1.4 (ref 1.2–2.2)
Alkaline Phosphatase: 64 IU/L (ref 39–117)
BILIRUBIN TOTAL: 0.3 mg/dL (ref 0.0–1.2)
BUN / CREAT RATIO: 24 — AB (ref 9–23)
BUN: 20 mg/dL (ref 6–24)
CALCIUM: 9.8 mg/dL (ref 8.7–10.2)
CO2: 27 mmol/L (ref 18–29)
CREATININE: 0.82 mg/dL (ref 0.57–1.00)
Chloride: 100 mmol/L (ref 96–106)
GFR, EST AFRICAN AMERICAN: 95 mL/min/{1.73_m2} (ref >59)
GFR, EST NON AFRICAN AMERICAN: 83 mL/min/{1.73_m2} (ref >59)
GLUCOSE: 77 mg/dL (ref 65–99)
Globulin, Total: 3.3 g/dL (ref 1.5–4.5)
Potassium: 4.7 mmol/L (ref 3.5–5.2)
Sodium: 141 mmol/L (ref 134–144)
TOTAL PROTEIN: 7.9 g/dL (ref 6.0–8.5)

## 2016-05-21 LAB — CBC WITH DIFFERENTIAL/PLATELET
Basophils Absolute: 0 10*3/uL (ref 0.0–0.2)
Basos: 0 %
EOS (ABSOLUTE): 0.1 10*3/uL (ref 0.0–0.4)
EOS: 1 %
HEMOGLOBIN: 12.9 g/dL (ref 11.1–15.9)
Hematocrit: 40.1 % (ref 34.0–46.6)
Immature Grans (Abs): 0 10*3/uL (ref 0.0–0.1)
Immature Granulocytes: 0 %
LYMPHS ABS: 2.6 10*3/uL (ref 0.7–3.1)
Lymphs: 25 %
MCH: 30.6 pg (ref 26.6–33.0)
MCHC: 32.2 g/dL (ref 31.5–35.7)
MCV: 95 fL (ref 79–97)
MONOCYTES: 7 %
Monocytes Absolute: 0.7 10*3/uL (ref 0.1–0.9)
NEUTROS ABS: 6.9 10*3/uL (ref 1.4–7.0)
Neutrophils: 67 %
Platelets: 343 10*3/uL (ref 150–379)
RBC: 4.22 x10E6/uL (ref 3.77–5.28)
RDW: 13.2 % (ref 12.3–15.4)
WBC: 10.3 10*3/uL (ref 3.4–10.8)

## 2016-05-21 LAB — LIPID PANEL WITH LDL/HDL RATIO
CHOLESTEROL TOTAL: 178 mg/dL (ref 100–199)
HDL: 55 mg/dL (ref >39)
LDL CALC: 111 mg/dL — AB (ref 0–99)
LDL/HDL RATIO: 2 ratio (ref 0.0–3.2)
TRIGLYCERIDES: 61 mg/dL (ref 0–149)
VLDL Cholesterol Cal: 12 mg/dL (ref 5–40)

## 2016-05-21 LAB — INSULIN, RANDOM: INSULIN: 5.7 u[IU]/mL (ref 2.6–24.9)

## 2016-05-21 LAB — VITAMIN B12: Vitamin B-12: 1099 pg/mL (ref 232–1245)

## 2016-05-21 LAB — HEMOGLOBIN A1C
ESTIMATED AVERAGE GLUCOSE: 94 mg/dL
Hgb A1c MFr Bld: 4.9 % (ref 4.8–5.6)

## 2016-05-21 LAB — T4, FREE: Free T4: 1.52 ng/dL (ref 0.82–1.77)

## 2016-05-21 LAB — TSH: TSH: 2.04 u[IU]/mL (ref 0.450–4.500)

## 2016-05-21 LAB — T3: T3 TOTAL: 111 ng/dL (ref 71–180)

## 2016-05-21 LAB — FOLATE

## 2016-05-23 DIAGNOSIS — F4323 Adjustment disorder with mixed anxiety and depressed mood: Secondary | ICD-10-CM | POA: Diagnosis not present

## 2016-05-23 MED FILL — LEVOCETIRIZINE 5 MG TABLET: 5 | 30 days supply | Qty: 30 | Fill #3

## 2016-06-03 ENCOUNTER — Ambulatory Visit (INDEPENDENT_AMBULATORY_CARE_PROVIDER_SITE_OTHER): Payer: 59 | Admitting: Family Medicine

## 2016-06-03 ENCOUNTER — Encounter (INDEPENDENT_AMBULATORY_CARE_PROVIDER_SITE_OTHER): Payer: Self-pay | Admitting: Family Medicine

## 2016-06-03 ENCOUNTER — Encounter: Payer: Self-pay | Admitting: Family Medicine

## 2016-06-03 VITALS — BP 142/83 | HR 69 | Temp 98.0°F | Ht 63.0 in | Wt 168.0 lb

## 2016-06-03 VITALS — BP 126/84 | HR 99 | Resp 16 | Wt 172.5 lb

## 2016-06-03 DIAGNOSIS — E7849 Other hyperlipidemia: Secondary | ICD-10-CM

## 2016-06-03 DIAGNOSIS — M75112 Incomplete rotator cuff tear or rupture of left shoulder, not specified as traumatic: Secondary | ICD-10-CM | POA: Diagnosis not present

## 2016-06-03 DIAGNOSIS — R030 Elevated blood-pressure reading, without diagnosis of hypertension: Secondary | ICD-10-CM

## 2016-06-03 DIAGNOSIS — Z9189 Other specified personal risk factors, not elsewhere classified: Secondary | ICD-10-CM | POA: Diagnosis not present

## 2016-06-03 DIAGNOSIS — E669 Obesity, unspecified: Secondary | ICD-10-CM

## 2016-06-03 DIAGNOSIS — E784 Other hyperlipidemia: Secondary | ICD-10-CM | POA: Diagnosis not present

## 2016-06-03 DIAGNOSIS — E559 Vitamin D deficiency, unspecified: Secondary | ICD-10-CM | POA: Diagnosis not present

## 2016-06-03 DIAGNOSIS — M999 Biomechanical lesion, unspecified: Secondary | ICD-10-CM

## 2016-06-03 DIAGNOSIS — Z6830 Body mass index (BMI) 30.0-30.9, adult: Secondary | ICD-10-CM | POA: Diagnosis not present

## 2016-06-03 NOTE — Patient Instructions (Signed)
Good to see you.  Ice 20 minutes 2 times daily. Usually after activity and before bed. Exercises 2-3 times a week.  pennsaid pinkie amount topically 2 times daily as needed.  Keep doing the exercises OK to lift 50% of what you were doing and increase 10% a week.  See me again in 4-6 weeks.

## 2016-06-03 NOTE — Progress Notes (Signed)
con

## 2016-06-03 NOTE — Assessment & Plan Note (Signed)
Doing better at this time. Discussed with patient at great length. Patient will start increasing activity as tolerated. We discussed icing regimen. Patient will start going back to her class. Follow-up with me again in 4-6 weeks.

## 2016-06-03 NOTE — Progress Notes (Signed)
Office: [phone]  /  Fax: [phone]   HPI:   Chief Complaint: OBESITY Dawn [name] is here to discuss her progress with her obesity treatment plan. She is following her eating plan approximately 90 % of the time and states she is exercising 0 minutes 0 times per week. Dawn [name] has done well with category 1 plan overall, but is starting to get bored with plan and had increased cravings later in the day and wanted an extra 100 calorie snack. Her weight is 168 lb (76.2 kg) today and has had a weight loss of 5 pounds over a period of 2 weeks since her last visit. She has lost 5 lbs since starting treatment with [place].  Hyperlipidemia Dawn [name] has hyperlipidemia, LDL slightly elevated  At 295 and has been trying to improve her cholesterol levels with intensive lifestyle modification including a low saturated fat diet, exercise and weight loss. She would like to control with diet. Dawn [name] denies any chest pain, claudication or myalgias.  Vitamin D deficiency Sugar has a diagnosis of vitamin D deficiency. She is currently taking vit D and denies nausea, vomiting or muscle weakness.  Elevated Blood Pressure without history of hypertension Dawn [name] had elevated blood pressure today, normally not a problem, not on medications, no recent illness or change in medications or unusual stress.  At risk for cardiovascular disease Dawn [name] is at a higher than average risk for cardiovascular disease due to obesity. She currently denies any chest pain.  Wt Readings from Last 500 Encounters:  06/03/16 168 lb (76.2 kg)  06/03/16 172 lb 8 oz (78.2 kg)  05/20/16 173 lb (78.5 kg)  05/13/16 179 lb 6.4 oz (81.4 kg)  04/29/16 177 lb (80.3 kg)  03/21/15 177 lb (80.3 kg)  02/28/15 181 lb (82.1 kg)  03/03/14 188 lb (85.3 kg)  08/19/13 182 lb (82.6 kg)  05/31/13 177 lb (80.3 kg)  03/31/13 187 lb 6.4 oz (85 kg)  02/21/13 195 lb 8 oz (88.7 kg)  12/21/12 187 lb 6 oz (85 kg)  08/24/12 186 lb 9.6 oz  (84.6 kg)  03/16/12 178 lb (80.7 kg)  03/11/12 173 lb (78.5 kg)  01/02/12 184 lb (83.5 kg)  12/22/11 185 lb (83.9 kg)  11/19/11 196 lb (88.9 kg)  06/17/11 185 lb (83.9 kg)     ALLERGIES: Allergies  Allergen Reactions  . Coconut Oil Anaphylaxis, Shortness Of Breath and Swelling    Coconut  . Latex Swelling    AND BLISTERS  . Sulfa Antibiotics Anaphylaxis    MEDICATIONS: Current Outpatient Prescriptions on File Prior to Visit  Medication Sig Dispense Refill  . albuterol (PROVENTIL HFA;VENTOLIN HFA) 108 (90 BASE) MCG/ACT inhaler Inhale 2 puffs into the lungs every 6 (six) hours as needed for wheezing.    Marland Kitchen b complex vitamins tablet Take 1 tablet by mouth daily.    . Biotin 5000 MCG CAPS Take by mouth every morning.    Marland Kitchen EPINEPHrine 0.3 mg/0.3 mL IJ SOAJ injection Inject into the muscle once.    Marland Kitchen levocetirizine (XYZAL) 5 MG tablet Take 5 mg by mouth every evening.    . Multiple Vitamins-Minerals (MULTIVITAMIN WITH MINERALS) tablet Take by mouth daily.     Marland Kitchen omega-3 acid ethyl esters (LOVAZA) 1 g capsule Take by mouth every morning.    . Vitamin D, Ergocalciferol, (DRISDOL) 50000 units CAPS capsule Take 1 capsule (50,000 Units total) by mouth every 7 (seven) days. 8 capsule 0   No current facility-administered medications on file prior to visit.  PAST MEDICAL HISTORY: Past Medical History:  Diagnosis Date  . Acute meniscal tear of knee LEFT  . Arthritis   . Asthma   . Constipation   . Contact lens/glasses fitting    wears contacts or glasses  . Environmental allergies   . Gallbladder problem   . History of gastric ulcer AS TEEN  . History of stomach ulcers   . History of viral pericarditis PROBABLE OR IDIOPATHIC PER D/C SUMMARY MARCH 2011   NO PROBLEMS SINCE  . Joint pain   . Lactose intolerance   . Multiple food allergies    coconut  . Osteoarthritis   . Pericarditis   . Primary localized osteoarthritis of right knee 11/25/2011   S/P Left Knee arthroscopy  performed by Dr. Charlann Boxer in April 2013.   . Seasonal allergies   . Torn rotator cuff     PAST SURGICAL HISTORY: Past Surgical History:  Procedure Laterality Date  . APPENDECTOMY  1998  . BILATERAL CARPAL TUNNEL RELEASE  1980'S  . BREAST REDUCTION SURGERY Bilateral 12/21/2012   Procedure: BILATERAL MAMMARY REDUCTION  (BREAST);  Surgeon: Etter Sjogren, MD;  Location: [facility];  Service: Plastics;  Laterality: Bilateral;  . CHOLECYSTECTOMY  1992  . KNEE ARTHROSCOPY  06/20/2011   Procedure: ARTHROSCOPY KNEE;  Surgeon: Shelda Pal, MD;  Location: [facility];  Service: Orthopedics;  Laterality: Left;  left knee scope   latex allergy patient blisters and swells  . PARTIAL KNEE ARTHROPLASTY Right 03/03/2014   Procedure: RIGHT UNICOMPARTMENTAL KNEE ARTHROPLASTY;  Surgeon: Eulas Post, MD;  Location: [facility];  Service: Orthopedics;  Laterality: Right;  . TRANSTHORACIC ECHOCARDIOGRAM  04-26-2009   NORMAL LVSF/ EF 60-65% / LVDF NORMAL / NO PERICARDIAL EFFUSION  . VAGINAL HYSTERECTOMY  03-18-1999    SOCIAL HISTORY: Social History  Substance Use Topics  . Smoking status: Never Smoker  . Smokeless tobacco: Never Used  . Alcohol use No    FAMILY HISTORY: Family History  Problem Relation Age of Onset  . Cancer Brother   . Asthma Son     ROS: Review of Systems  Constitutional: Positive for weight loss.  Cardiovascular: Negative for chest pain and claudication.  Gastrointestinal: Negative for nausea and vomiting.  Musculoskeletal: Negative for myalgias.       Negative muscle weakness    PHYSICAL EXAM: Blood pressure (!) 142/83, pulse 69, temperature 98 F (36.7 C), temperature source Oral, height  (1.6 m), weight 168 lb (76.2 kg), SpO2 100 %. Body mass index is 29.76 kg/m. Physical Exam  Constitutional: She is oriented to person, place, and time. She appears well-developed and well-nourished.  Cardiovascular: Normal rate.     Pulmonary/Chest: Effort normal.  Musculoskeletal: Normal range of motion.  Neurological: She is oriented to person, place, and time.  Skin: Skin is warm and dry.  Psychiatric: She has a normal mood and affect. Her behavior is normal.  Vitals reviewed.   RECENT LABS AND TESTS: BMET    Component Value Date/Time   NA 141 05/20/2016 0954   K 4.7 05/20/2016 0954   CL 100 05/20/2016 0954   CO2 27 05/20/2016 0954   GLUCOSE 77 05/20/2016 0954   GLUCOSE 89 03/09/2012 1915   BUN 20 05/20/2016 0954   CREATININE 0.82 05/20/2016 0954   CALCIUM 9.8 05/20/2016 0954   GFRNONAA 83 05/20/2016 0954   GFRAA 95 05/20/2016 0954   Lab Results  Component Value Date   HGBA1C 4.9 05/20/2016  Lab Results  Component Value Date   INSULIN 5.7 05/20/2016   CBC    Component Value Date/Time   WBC 10.3 05/20/2016 0954   WBC 11.5 (H) 03/09/2012 1915   RBC 4.22 05/20/2016 0954   RBC 4.32 03/09/2012 1915   HGB 12.8 03/03/2014 1252   HCT 40.1 05/20/2016 0954   PLT 343 05/20/2016 0954   MCV 95 05/20/2016 0954   MCH 30.6 05/20/2016 0954   MCH 31.3 03/09/2012 1915   MCHC 32.2 05/20/2016 0954   MCHC 34.0 03/09/2012 1915   RDW 13.2 05/20/2016 0954   LYMPHSABS 2.6 05/20/2016 0954   MONOABS 0.6 03/09/2012 1915   EOSABS 0.1 05/20/2016 0954   BASOSABS 0.0 05/20/2016 0954   Iron/TIBC/Ferritin/ %Sat No results found for: IRON, TIBC, FERRITIN, IRONPCTSAT Lipid Panel     Component Value Date/Time   CHOL 178 05/20/2016 0954   TRIG 61 05/20/2016 0954   HDL 55 05/20/2016 0954   LDLCALC 111 (H) 05/20/2016 0954   Hepatic Function Panel     Component Value Date/Time   PROT 7.9 05/20/2016 0954   ALBUMIN 4.6 05/20/2016 0954   AST 18 05/20/2016 0954   ALT 19 05/20/2016 0954   ALKPHOS 64 05/20/2016 0954   BILITOT 0.3 05/20/2016 0954      Component Value Date/Time   TSH 2.040 05/20/2016 0954    ASSESSMENT AND PLAN: Other hyperlipidemia  Vitamin D deficiency  Elevated blood pressure reading  without diagnosis of hypertension  At risk for heart disease  Class 1 obesity without serious comorbidity with body mass index (BMI) of 30.0 to 30.9 in adult, unspecified obesity type - Starting BMI greater then 30  PLAN:  Hyperlipidemia Porcia was informed of the American Heart Association Guidelines emphasizing intensive lifestyle modifications as the first line treatment for hyperlipidemia. We discussed many lifestyle modifications today in depth, and Janalee will continue to work on decreasing saturated fats such as fatty red meat, butter and many fried foods. She will also increase vegetables and lean protein in her diet and continue to work on exercise and weight loss efforts. We will re-check labs in 3 months.  Vitamin D Deficiency Cinde was informed that low vitamin D levels contributes to fatigue and are associated with obesity, breast, and colon cancer. She agrees to continue to take prescription Vit D ,000 IU every week and we will re-check labs in 3 months and will follow up for routine testing of vitamin D, at least 2-3 times per year. She was informed of the risk of over-replacement of vitamin D and agrees to not increase her dose unless he discusses this with [place] first.  Elevated Blood Pressure without history of hypertension Oluwatamilore agrees to continue to work on diet and weight loss efforts and increase H2O and we will plan to re-check blood pressure in 2 weeks.  Cardiovascular risk counselling Corinthia was given extended (at least 30 minutes) coronary artery disease prevention counseling today. She is 51 y.o. female and has risk factors for heart disease including obesity. We discussed intensive lifestyle modifications today with an emphasis on specific weight loss instructions and strategies. Pt was also informed of the importance of increasing exercise and decreasing saturated fats to help prevent heart disease.  Obesity Mindee is currently in the action stage  of change. As such, her goal is to continue with weight loss efforts She has agreed to follow the Category 1 plan +100 calories Keyonta has been instructed to work up to a goal of 150 minutes  of combined cardio and strengthening exercise per week for weight loss and overall health benefits. We discussed the following Behavioral Modification Stratagies today: no skipping meals, increasing lean protein intake and work on meal planning and easy cooking plans  Kloe has agreed to follow up with our clinic in 2 weeks. She was informed of the importance of frequent follow up visits to maximize her success with intensive lifestyle modifications for her multiple health conditions.  I, [place] Crane, am acting as scribe for Quillian Quince, MD  I have reviewed the above documentation for accuracy and completeness, and I agree with the above. -Quillian Quince, MD

## 2016-06-03 NOTE — Progress Notes (Signed)
[name] [address] [place], [place] [address] Phone: [phone] Subjective:    I'm seeing this patient by the request  of:  POLITE,RONALD D, MD   CC: left shoulder pain f/u   [id number]  Dawn [name] is a 51 y.o. female coming in with complaint of left shoulder pain.  Patient was found to have a rotator cuff tear. Patient though has been doing the home exercises. Patient was given an injection. Was doing significantly better at last time now. Patient states that feeling 70% better. Denies any new symptoms. Unable to tolerate the nitroglycerin as well as the gabapentin.     Past Medical History:  Diagnosis Date  . Acute meniscal tear of knee LEFT  . Arthritis   . Asthma   . Constipation   . Contact lens/glasses fitting    wears contacts or glasses  . Environmental allergies   . Gallbladder problem   . History of gastric ulcer AS TEEN  . History of stomach ulcers   . History of viral pericarditis PROBABLE OR IDIOPATHIC PER D/C SUMMARY MARCH 2011   NO PROBLEMS SINCE  . Joint pain   . Lactose intolerance   . Multiple food allergies    coconut  . Osteoarthritis   . Pericarditis   . Primary localized osteoarthritis of right knee 11/25/2011   S/P Left Knee arthroscopy performed by Dr. Charlann Boxer in April 2013.   . Seasonal allergies   . Torn rotator cuff    Past Surgical History:  Procedure Laterality Date  . APPENDECTOMY  1998  . BILATERAL CARPAL TUNNEL RELEASE  1980'S  . BREAST REDUCTION SURGERY Bilateral 12/21/2012   Procedure: BILATERAL MAMMARY REDUCTION  (BREAST);  Surgeon: Etter Sjogren, MD;  Location: [facility];  Service: Plastics;  Laterality: Bilateral;  . CHOLECYSTECTOMY  1992  . KNEE ARTHROSCOPY  06/20/2011   Procedure: ARTHROSCOPY KNEE;  Surgeon: Shelda Pal, MD;  Location: [facility];  Service: Orthopedics;  Laterality: Left;  left knee scope   latex allergy patient blisters and swells    . PARTIAL KNEE ARTHROPLASTY Right 03/03/2014   Procedure: RIGHT UNICOMPARTMENTAL KNEE ARTHROPLASTY;  Surgeon: Eulas Post, MD;  Location: [facility];  Service: Orthopedics;  Laterality: Right;  . TRANSTHORACIC ECHOCARDIOGRAM  04-26-2009   NORMAL LVSF/ EF 60-65% / LVDF NORMAL / NO PERICARDIAL EFFUSION  . VAGINAL HYSTERECTOMY  03-18-1999   Social History   Social History  . Marital status: Married    Spouse name: Fayrene Fearing  . Number of children: 2  . Years of education: N/A   Occupational History  . organizational Dev. Practitioner    Social History Main Topics  . Smoking status: Never Smoker  . Smokeless tobacco: Never Used  . Alcohol use No  . Drug use: No  . Sexual activity: Yes    Partners: Male    Birth control/ protection: Surgical   Other Topics Concern  . None   Social History Narrative  . None   Allergies  Allergen Reactions  . Coconut Oil Anaphylaxis, Shortness Of Breath and Swelling    Coconut  . Latex Swelling    AND BLISTERS  . Sulfa Antibiotics Anaphylaxis   Family History  Problem Relation Age of Onset  . Cancer Brother   . Asthma Son     Past medical history, social, surgical and family history all reviewed in electronic medical record.  No pertanent information unless stated regarding to the chief  complaint.   Review of Systems: No headache, visual changes, nausea, vomiting, diarrhea, constipation, dizziness, abdominal pain, skin rash, fevers, chills, night sweats, weight loss, swollen lymph nodes, body aches, joint swelling, muscle aches, chest pain, shortness of breath, mood changes.    Objective  Blood pressure 126/84, pulse 99, resp. rate 16, weight 172 lb 8 oz (78.2 kg), SpO2 99 %.   Systems examined below as of 06/03/16 General: NAD A&O x3 mood, affect normal  HEENT: Pupils equal, extraocular movements intact no nystagmus Respiratory: not short of breath at rest or with speaking Cardiovascular: No lower extremity edema, non  tender Skin: Warm dry intact with no signs of infection or rash on extremities or on axial skeleton. Abdomen: Soft nontender, no masses Neuro: Cranial nerves  intact, neurovascularly intact in all extremities with 2+ DTRs and 2+ pulses. Lymph: No lymphadenopathy appreciated today  Gait normal with good balance and coordination.  MSK: Non tender with full range of motion and good stability and symmetric strength and tone of  elbows, wrist,  knee hips and ankles bilaterally.   Neck: Inspection unremarkable. No palpable stepoffs. Negative Spurling's maneuver. Full neck range of motion Grip strength and sensation normal in bilateral hands Strength good C4 to T1 distribution No sensory change to C4 to T1 Negative Hoffman sign bilaterally Reflexes normal Shoulder: Left Inspection reveals no abnormalities, atrophy or asymmetry. Palpation is normal with no tenderness over AC joint or bicipital groove. ROM is full in all planes. Rotator cuff strength normal throughout. Mild impingement noted Speeds and Yergason's tests normal. No labral pathology noted with negative Obrien's, negative clunk and good stability. Normal scapular function observed. No painful arc and no drop arm sign. No apprehension sign  Osteopathic findings Cervical C2 flexed rotated and side bent right C4 flexed rotated and side bent left C6 flexed rotated and side bent left T3 extended rotated and side bent right inhaled third rib T9 extended rotated and side bent left L2 flexed rotated and side bent right Sacrum right on right       Impression and Recommendations:     This case required medical decision making of moderate complexity.      Note: This dictation was prepared with Dragon dictation along with smaller phrase technology. Any transcriptional errors that result from this process are unintentional.

## 2016-06-03 NOTE — Assessment & Plan Note (Signed)
Decision today to treat with OMT was based on Physical Exam  After verbal consent patient was treated with HVLA, ME, FPR techniques in cervical, thoracic, lumbar and sacral areas  Patient tolerated the procedure well with improvement in symptoms  Patient given exercises, stretches and lifestyle modifications  See medications in patient instructions if given  Patient will follow up in 3-6 weeks 

## 2016-06-03 NOTE — Progress Notes (Signed)
Pre-visit discussion using our clinic review tool. No additional management support is needed unless otherwise documented below in the visit note.  

## 2016-06-04 ENCOUNTER — Ambulatory Visit (INDEPENDENT_AMBULATORY_CARE_PROVIDER_SITE_OTHER): Payer: 59 | Admitting: Family Medicine

## 2016-06-10 DIAGNOSIS — F4323 Adjustment disorder with mixed anxiety and depressed mood: Secondary | ICD-10-CM | POA: Diagnosis not present

## 2016-06-16 ENCOUNTER — Ambulatory Visit (INDEPENDENT_AMBULATORY_CARE_PROVIDER_SITE_OTHER): Payer: 59 | Admitting: Family Medicine

## 2016-06-16 VITALS — BP 117/77 | HR 69 | Temp 97.9°F | Ht 63.0 in | Wt 166.0 lb

## 2016-06-16 DIAGNOSIS — E669 Obesity, unspecified: Secondary | ICD-10-CM

## 2016-06-16 DIAGNOSIS — K5900 Constipation, unspecified: Secondary | ICD-10-CM

## 2016-06-16 DIAGNOSIS — E559 Vitamin D deficiency, unspecified: Secondary | ICD-10-CM

## 2016-06-16 MED ORDER — DOCUSATE SODIUM 100 MG PO CAPS: 100.0000 mg | ORAL_CAPSULE | Freq: Two times a day (BID) | ORAL | 0 refills | Status: DC

## 2016-06-16 MED ORDER — VITAMIN D (ERGOCALCIFEROL) 1.25 MG (50000 UNIT) PO CAPS: 50000.0000 [IU] | ORAL_CAPSULE | ORAL | 0 refills | Status: DC

## 2016-06-17 MED FILL — VIT D2 1.25 MG (50,000 UNIT: 1.25 MG | 56 days supply | Qty: 8 | Fill #0

## 2016-06-17 NOTE — Progress Notes (Signed)
Office: [phone]  /  Fax: [phone]   HPI:   Chief Complaint: OBESITY Dawn [name] is here to discuss her progress with her obesity treatment plan. She is following her eating plan approximately 90 % of the time and states she is exercising 0 minutes 0 times per week. Dawn [name] continues to do well with weight loss, even with increased stress and temptation. She is doing well on category 1 plan. Hunger is controlled. Her weight is 166 lb (75.3 kg) today and has had a weight loss of 2 pounds over a period of 2 weeks since her last visit. She has lost 7 lbs since starting treatment with [place].  Vitamin D deficiency Dawn [name] has a diagnosis of vitamin D deficiency. She is currently stable on vit D and denies nausea, vomiting or muscle weakness.  Constipation Dawn [name] notes constipation for the last few weeks, worse since attempting weight loss. She states BM are less frequent and are not hard and painful. She does note increased abdominal cramping at least 2 to 3 times per week. She has taken Colace before with improvement. She denies hematochezia or melena. She denies drinking less H20 recently.  Wt Readings from Last 500 Encounters:  06/16/16 166 lb (75.3 kg)  06/03/16 168 lb (76.2 kg)  06/03/16 172 lb 8 oz (78.2 kg)  05/20/16 173 lb (78.5 kg)  05/13/16 179 lb 6.4 oz (81.4 kg)  04/29/16 177 lb (80.3 kg)  03/21/15 177 lb (80.3 kg)  02/28/15 181 lb (82.1 kg)  03/03/14 188 lb (85.3 kg)  08/19/13 182 lb (82.6 kg)  05/31/13 177 lb (80.3 kg)  03/31/13 187 lb 6.4 oz (85 kg)  02/21/13 195 lb 8 oz (88.7 kg)  12/21/12 187 lb 6 oz (85 kg)  08/24/12 186 lb 9.6 oz (84.6 kg)  03/16/12 178 lb (80.7 kg)  03/11/12 173 lb (78.5 kg)  01/02/12 184 lb (83.5 kg)  12/22/11 185 lb (83.9 kg)  11/19/11 196 lb (88.9 kg)  06/17/11 185 lb (83.9 kg)     ALLERGIES: Allergies  Allergen Reactions  . Coconut Oil Anaphylaxis, Shortness Of Breath and Swelling    Coconut  . Latex Swelling    AND  BLISTERS  . Sulfa Antibiotics Anaphylaxis    MEDICATIONS: Current Outpatient Prescriptions on File Prior to Visit  Medication Sig Dispense Refill  . albuterol (PROVENTIL HFA;VENTOLIN HFA) 108 (90 BASE) MCG/ACT inhaler Inhale 2 puffs into the lungs every 6 (six) hours as needed for wheezing.    Marland Kitchen b complex vitamins tablet Take 1 tablet by mouth daily.    . Biotin 5000 MCG CAPS Take by mouth every morning.    Marland Kitchen EPINEPHrine 0.3 mg/0.3 mL IJ SOAJ injection Inject into the muscle once.    Marland Kitchen levocetirizine (XYZAL) 5 MG tablet Take 5 mg by mouth every evening.    . Multiple Vitamins-Minerals (MULTIVITAMIN WITH MINERALS) tablet Take by mouth daily.     Marland Kitchen omega-3 acid ethyl esters (LOVAZA) 1 g capsule Take by mouth every morning.     No current facility-administered medications on file prior to visit.     PAST MEDICAL HISTORY: Past Medical History:  Diagnosis Date  . Acute meniscal tear of knee LEFT  . Arthritis   . Asthma   . Constipation   . Contact lens/glasses fitting    wears contacts or glasses  . Environmental allergies   . Gallbladder problem   . History of gastric ulcer AS TEEN  . History of stomach ulcers   . History  of viral pericarditis PROBABLE OR IDIOPATHIC PER D/C SUMMARY MARCH 2011   NO PROBLEMS SINCE  . Joint pain   . Lactose intolerance   . Multiple food allergies    coconut  . Osteoarthritis   . Pericarditis   . Primary localized osteoarthritis of right knee 11/25/2011   S/P Left Knee arthroscopy performed by Dr. Charlann Boxer in April 2013.   . Seasonal allergies   . Torn rotator cuff     PAST SURGICAL HISTORY: Past Surgical History:  Procedure Laterality Date  . APPENDECTOMY  1998  . BILATERAL CARPAL TUNNEL RELEASE  1980'S  . BREAST REDUCTION SURGERY Bilateral 12/21/2012   Procedure: BILATERAL MAMMARY REDUCTION  (BREAST);  Surgeon: Etter Sjogren, MD;  Location: [facility];  Service: Plastics;  Laterality: Bilateral;  . CHOLECYSTECTOMY  1992  . KNEE  ARTHROSCOPY  06/20/2011   Procedure: ARTHROSCOPY KNEE;  Surgeon: Shelda Pal, MD;  Location: [facility];  Service: Orthopedics;  Laterality: Left;  left knee scope   latex allergy patient blisters and swells  . PARTIAL KNEE ARTHROPLASTY Right 03/03/2014   Procedure: RIGHT UNICOMPARTMENTAL KNEE ARTHROPLASTY;  Surgeon: Eulas Post, MD;  Location: [facility];  Service: Orthopedics;  Laterality: Right;  . TRANSTHORACIC ECHOCARDIOGRAM  04-26-2009   NORMAL LVSF/ EF 60-65% / LVDF NORMAL / NO PERICARDIAL EFFUSION  . VAGINAL HYSTERECTOMY  03-18-1999    SOCIAL HISTORY: Social History  Substance Use Topics  . Smoking status: Never Smoker  . Smokeless tobacco: Never Used  . Alcohol use No    FAMILY HISTORY: Family History  Problem Relation Age of Onset  . Cancer Brother   . Asthma Son     ROS: Review of Systems  Constitutional: Positive for weight loss.  Gastrointestinal: Positive for constipation. Negative for melena, nausea and vomiting.       Abdominal cramping  Musculoskeletal:       Negative muscle weakness    PHYSICAL EXAM: Blood pressure 117/77, pulse 69, temperature 97.9 F (36.6 C), temperature source Oral, height  (1.6 m), weight 166 lb (75.3 kg), SpO2 100 %. Body mass index is 29.41 kg/m. Physical Exam  Constitutional: She is oriented to person, place, and time. She appears well-developed and well-nourished.  Cardiovascular: Normal rate.   Pulmonary/Chest: Effort normal.  Neurological: She is oriented to person, place, and time.  Skin: Skin is warm and dry.  Psychiatric: She has a normal mood and affect. Her behavior is normal.  Vitals reviewed.   RECENT LABS AND TESTS: BMET    Component Value Date/Time   NA 141 05/20/2016 0954   K 4.7 05/20/2016 0954   CL 100 05/20/2016 0954   CO2 27 05/20/2016 0954   GLUCOSE 77 05/20/2016 0954   GLUCOSE 89 03/09/2012 1915   BUN 20 05/20/2016 0954   CREATININE 0.82 05/20/2016 0954    CALCIUM 9.8 05/20/2016 0954   GFRNONAA 83 05/20/2016 0954   GFRAA 95 05/20/2016 0954   Lab Results  Component Value Date   HGBA1C 4.9 05/20/2016   Lab Results  Component Value Date   INSULIN 5.7 05/20/2016   CBC    Component Value Date/Time   WBC 10.3 05/20/2016 0954   WBC 11.5 (H) 03/09/2012 1915   RBC 4.22 05/20/2016 0954   RBC 4.32 03/09/2012 1915   HGB 12.8 03/03/2014 1252   HCT 40.1 05/20/2016 0954   PLT 343 05/20/2016 0954   MCV 95 05/20/2016 0954   MCH 30.6 05/20/2016 0954  MCH 31.3 03/09/2012 1915   MCHC 32.2 05/20/2016 0954   MCHC 34.0 03/09/2012 1915   RDW 13.2 05/20/2016 0954   LYMPHSABS 2.6 05/20/2016 0954   MONOABS 0.6 03/09/2012 1915   EOSABS 0.1 05/20/2016 0954   BASOSABS 0.0 05/20/2016 0954   Iron/TIBC/Ferritin/ %Sat No results found for: IRON, TIBC, FERRITIN, IRONPCTSAT Lipid Panel     Component Value Date/Time   CHOL 178 05/20/2016 0954   TRIG 61 05/20/2016 0954   HDL 55 05/20/2016 0954   LDLCALC 111 (H) 05/20/2016 0954   Hepatic Function Panel     Component Value Date/Time   PROT 7.9 05/20/2016 0954   ALBUMIN 4.6 05/20/2016 0954   AST 18 05/20/2016 0954   ALT 19 05/20/2016 0954   ALKPHOS 64 05/20/2016 0954   BILITOT 0.3 05/20/2016 0954      Component Value Date/Time   TSH 2.040 05/20/2016 0954    ASSESSMENT AND PLAN: Constipation, unspecified constipation type - Plan: docusate sodium (COLACE) 100 MG capsule  Vitamin D deficiency - Plan: Vitamin D, Ergocalciferol, (DRISDOL) 50000 units CAPS capsule  Class 1 obesity without serious comorbidity in adult, unspecified BMI, unspecified obesity type - Patient BMI over 30 when she began the program  PLAN:  Vitamin D Deficiency Dawn [name] was informed that low vitamin D levels contributes to fatigue and are associated with obesity, breast, and colon cancer. She agrees to continue to take prescription Vit D ,000 IU every week, we will refill for 1 month and will re-check labs in 2 weeks  and will follow up for routine testing of vitamin D, at least 2-3 times per year. She was informed of the risk of over-replacement of vitamin D and agrees to not increase her dose unless he discusses this with [place] first. Dawn [name] agrees to follow up with our clinic in 2 weeks.  Constipation Dawn [name] was informed decrease bowel movement frequency is normal while losing weight, but stools should not be hard or painful. She was advised to increase her H20 intake and work on increasing her fiber intake. High fiber foods were discussed today. Analyah agrees to start to take Colace 100 mg bid #60 with no refills and follow up with our clinic in 2 weeks.  Obesity Dawn [name] is currently in the action stage of change. As such, her goal is to continue with weight loss efforts She has agreed to follow the Category 1 plan Dawn [name] has been instructed to work up to a goal of 150 minutes of combined cardio and strengthening exercise per week for weight loss and overall health benefits. We discussed the following Behavioral Modification Stratagies today: increasing H2O, increasing lean protein intake, decreasing simple carbohydrates and increasing fiber rich foods  Dawn [name] has agreed to follow up with our clinic in 2 weeks. She was informed of the importance of frequent follow up visits to maximize her success with intensive lifestyle modifications for her multiple health conditions.  I, Nevada Crane, am acting as scribe for Quillian Quince, MD  I have reviewed the above documentation for accuracy and completeness, and I agree with the above. -Quillian Quince, MD

## 2016-06-24 DIAGNOSIS — H5203 Hypermetropia, bilateral: Secondary | ICD-10-CM | POA: Diagnosis not present

## 2016-07-01 ENCOUNTER — Ambulatory Visit (INDEPENDENT_AMBULATORY_CARE_PROVIDER_SITE_OTHER): Payer: 59 | Admitting: Family Medicine

## 2016-07-01 VITALS — BP 110/70 | HR 64 | Temp 98.3°F | Ht 63.0 in | Wt 168.0 lb

## 2016-07-01 DIAGNOSIS — E559 Vitamin D deficiency, unspecified: Secondary | ICD-10-CM

## 2016-07-01 DIAGNOSIS — E669 Obesity, unspecified: Secondary | ICD-10-CM | POA: Diagnosis not present

## 2016-07-01 DIAGNOSIS — Z6830 Body mass index (BMI) 30.0-30.9, adult: Secondary | ICD-10-CM | POA: Diagnosis not present

## 2016-07-01 MED ORDER — VITAMIN D (ERGOCALCIFEROL) 1.25 MG (50000 UNIT) PO CAPS: 50000.0000 [IU] | ORAL_CAPSULE | ORAL | 0 refills | Status: DC

## 2016-07-01 NOTE — Progress Notes (Signed)
Office: [phone]  /  Fax: [phone]   HPI:   Chief Complaint: OBESITY Dawn [name] is here to discuss her progress with her obesity treatment plan. She is on the  follow the Category 1 plan and is following her eating plan approximately 85 % of the time. She states she is walking 20 minutes 2 to 3 times per week. Dawn [name] is struggling to follow her category 1 plan but having hot flashes and feeling more nauseated. She is retaining some fluid today and feels somewhat bloated. Her weight is 168 lb (76.2 kg) today and has had a weight gain of 2 pounds over a period of 2 weeks since her last visit. She has lost 5 lbs since starting treatment with [place].  Vitamin D deficiency Dawn [name] has a diagnosis of vitamin D deficiency. She is currently stable on vit D, not yet at goal and denies nausea, vomiting or muscle weakness.  Wt Readings from Last 500 Encounters:  07/01/16 168 lb (76.2 kg)  06/16/16 166 lb (75.3 kg)  06/03/16 168 lb (76.2 kg)  06/03/16 172 lb 8 oz (78.2 kg)  05/20/16 173 lb (78.5 kg)  05/13/16 179 lb 6.4 oz (81.4 kg)  04/29/16 177 lb (80.3 kg)  03/21/15 177 lb (80.3 kg)  02/28/15 181 lb (82.1 kg)  03/03/14 188 lb (85.3 kg)  08/19/13 182 lb (82.6 kg)  05/31/13 177 lb (80.3 kg)  03/31/13 187 lb 6.4 oz (85 kg)  02/21/13 195 lb 8 oz (88.7 kg)  12/21/12 187 lb 6 oz (85 kg)  08/24/12 186 lb 9.6 oz (84.6 kg)  03/16/12 178 lb (80.7 kg)  03/11/12 173 lb (78.5 kg)  01/02/12 184 lb (83.5 kg)  12/22/11 185 lb (83.9 kg)  11/19/11 196 lb (88.9 kg)  06/17/11 185 lb (83.9 kg)     ALLERGIES: Allergies  Allergen Reactions  . Coconut Oil Anaphylaxis, Shortness Of Breath and Swelling    Coconut  . Latex Swelling    AND BLISTERS  . Sulfa Antibiotics Anaphylaxis    MEDICATIONS: Current Outpatient Prescriptions on File Prior to Visit  Medication Sig Dispense Refill  . albuterol (PROVENTIL HFA;VENTOLIN HFA) 108 (90 BASE) MCG/ACT inhaler Inhale 2 puffs into the lungs every  6 (six) hours as needed for wheezing.    Marland Kitchen b complex vitamins tablet Take 1 tablet by mouth daily.    . Biotin 5000 MCG CAPS Take by mouth every morning.    . docusate sodium (COLACE) 100 MG capsule Take 1 capsule (100 mg total) by mouth 2 (two) times daily. 60 capsule 0  . EPINEPHrine 0.3 mg/0.3 mL IJ SOAJ injection Inject into the muscle once.    Marland Kitchen levocetirizine (XYZAL) 5 MG tablet Take 5 mg by mouth every evening.    . Multiple Vitamins-Minerals (MULTIVITAMIN WITH MINERALS) tablet Take by mouth daily.     Marland Kitchen omega-3 acid ethyl esters (LOVAZA) 1 g capsule Take by mouth every morning.     No current facility-administered medications on file prior to visit.     PAST MEDICAL HISTORY: Past Medical History:  Diagnosis Date  . Acute meniscal tear of knee LEFT  . Arthritis   . Asthma   . Constipation   . Contact lens/glasses fitting    wears contacts or glasses  . Environmental allergies   . Gallbladder problem   . History of gastric ulcer AS TEEN  . History of stomach ulcers   . History of viral pericarditis PROBABLE OR IDIOPATHIC PER D/C SUMMARY [facility] 2011  NO PROBLEMS SINCE  . Joint pain   . Lactose intolerance   . Multiple food allergies    coconut  . Osteoarthritis   . Pericarditis   . Primary localized osteoarthritis of right knee 11/25/2011   S/P Left Knee arthroscopy performed by Dr. Charlann Boxer in April 2013.   . Seasonal allergies   . Torn rotator cuff     PAST SURGICAL HISTORY: Past Surgical History:  Procedure Laterality Date  . APPENDECTOMY  1998  . BILATERAL CARPAL TUNNEL RELEASE  1980'S  . BREAST REDUCTION SURGERY Bilateral 12/21/2012   Procedure: BILATERAL MAMMARY REDUCTION  (BREAST);  Surgeon: Etter Sjogren, MD;  Location: [facility];  Service: Plastics;  Laterality: Bilateral;  . CHOLECYSTECTOMY  1992  . KNEE ARTHROSCOPY  06/20/2011   Procedure: ARTHROSCOPY KNEE;  Surgeon: Shelda Pal, MD;  Location: [facility];  Service:  Orthopedics;  Laterality: Left;  left knee scope   latex allergy patient blisters and swells  . PARTIAL KNEE ARTHROPLASTY Right 03/03/2014   Procedure: RIGHT UNICOMPARTMENTAL KNEE ARTHROPLASTY;  Surgeon: Eulas Post, MD;  Location: [facility];  Service: Orthopedics;  Laterality: Right;  . TRANSTHORACIC ECHOCARDIOGRAM  04-26-2009   NORMAL LVSF/ EF 60-65% / LVDF NORMAL / NO PERICARDIAL EFFUSION  . VAGINAL HYSTERECTOMY  03-18-1999    SOCIAL HISTORY: Social History  Substance Use Topics  . Smoking status: Never Smoker  . Smokeless tobacco: Never Used  . Alcohol use No    FAMILY HISTORY: Family History  Problem Relation Age of Onset  . Cancer Brother   . Asthma Son     ROS: Review of Systems  Constitutional: Negative for weight loss.  Gastrointestinal: Negative for nausea and vomiting.  Musculoskeletal:       Negative muscle weakness    PHYSICAL EXAM: Blood pressure 110/70, pulse 64, temperature 98.3 F (36.8 C), temperature source Oral, height 5\' 3"  (1.6 m), weight 168 lb (76.2 kg), SpO2 100 %. Body mass index is 29.76 kg/m. Physical Exam  Constitutional: She is oriented to person, place, and time. She appears well-developed and well-nourished.  Cardiovascular: Normal rate.   Pulmonary/Chest: Effort normal.  Musculoskeletal: Normal range of motion.  Neurological: She is oriented to person, place, and time.  Skin: Skin is warm and dry.  Psychiatric: She has a normal mood and affect. Her behavior is normal.  Vitals reviewed.   RECENT LABS AND TESTS: BMET    Component Value Date/Time   NA 141 05/20/2016 0954   K 4.7 05/20/2016 0954   CL 100 05/20/2016 0954   CO2 27 05/20/2016 0954   GLUCOSE 77 05/20/2016 0954   GLUCOSE 89 03/09/2012 1915   BUN 20 05/20/2016 0954   CREATININE 0.82 05/20/2016 0954   CALCIUM 9.8 05/20/2016 0954   GFRNONAA 83 05/20/2016 0954   GFRAA 95 05/20/2016 0954   Lab Results  Component Value Date   HGBA1C 4.9 05/20/2016     Lab Results  Component Value Date   INSULIN 5.7 05/20/2016   CBC    Component Value Date/Time   WBC 10.3 05/20/2016 0954   WBC 11.5 (H) 03/09/2012 1915   RBC 4.22 05/20/2016 0954   RBC 4.32 03/09/2012 1915   HGB 12.8 03/03/2014 1252   HCT 40.1 05/20/2016 0954   PLT 343 05/20/2016 0954   MCV 95 05/20/2016 0954   MCH 30.6 05/20/2016 0954   MCH 31.3 03/09/2012 1915   MCHC 32.2 05/20/2016 0954   MCHC 34.0 03/09/2012 1915  RDW 13.2 05/20/2016 0954   LYMPHSABS 2.6 05/20/2016 0954   MONOABS 0.6 03/09/2012 1915   EOSABS 0.1 05/20/2016 0954   BASOSABS 0.0 05/20/2016 0954   Iron/TIBC/Ferritin/ %Sat No results found for: IRON, TIBC, FERRITIN, IRONPCTSAT Lipid Panel     Component Value Date/Time   CHOL 178 05/20/2016 0954   TRIG 61 05/20/2016 0954   HDL 55 05/20/2016 0954   LDLCALC 111 (H) 05/20/2016 0954   Hepatic Function Panel     Component Value Date/Time   PROT 7.9 05/20/2016 0954   ALBUMIN 4.6 05/20/2016 0954   AST 18 05/20/2016 0954   ALT 19 05/20/2016 0954   ALKPHOS 64 05/20/2016 0954   BILITOT 0.3 05/20/2016 0954      Component Value Date/Time   TSH 2.040 05/20/2016 0954    ASSESSMENT AND PLAN: Vitamin D deficiency - Plan: Vitamin D, Ergocalciferol, (DRISDOL) 50000 units CAPS capsule  Class 1 obesity without serious comorbidity with body mass index (BMI) of 30.0 to 30.9 in adult, unspecified obesity type - Starting BMI greater then 30  PLAN:  Vitamin D Deficiency Dawn [name] was informed that low vitamin D levels contributes to fatigue and are associated with obesity, breast, and colon cancer. She agrees to continue to take prescription Vit D @50 ,000 IU every week, we will refill for 1 month and will follow up for routine testing of vitamin D, at least 2-3 times per year. She was informed of the risk of over-replacement of vitamin D and agrees to not increase her dose unless he discusses this with us first.  Obesity Dawn [name] is currently in the action  stage of change. As such, her goal is to continue with weight loss efforts She has agreed to follow the Category 1 plan Dawn [name] has been instructed to work up to a goal of 150 minutes of combined cardio and strengthening exercise per week for weight loss and overall health benefits. We discussed the following Behavioral Modification Strategies today: increasing H2O, no skipping meals, increasing lean protein intake and decreasing simple carbohydrates  Risa okay to replace 1 meal with premier protein shake.  Dawn [name] has agreed to follow up with our clinic in 2 weeks. She was informed of the importance of frequent follow up visits to maximize her success with intensive lifestyle modifications for her multiple health conditions.  I, [place] CraneJoanne Murray, am acting as scribe for Quillian Quincearen Kiley Solimine, MD  I have reviewed the above documentation for accuracy and completeness, and I agree with the above. -Quillian Quincearen Jessicia Napolitano, MD

## 2016-07-11 NOTE — Progress Notes (Deleted)
Dawn [name] D.O. [facility] [address] [place], [place] [address] Phone: [phone] Subjective:    I'm seeing this patient by the request  of:  Renford DillsPolite, Ronald, MD   CC: left shoulder pain f/u   [id number]:Subjective  Dawn SchmidConstance M [name] is a 51 y.o. female coming in with complaint of left shoulder pain.  Patient was found to have a rotator cuff tear. Was given an injection. Mild arthritic changes of the neck. Started with osteopathic manipulation. Patient states     Past Medical History:  Diagnosis Date  . Acute meniscal tear of knee LEFT  . Arthritis   . Asthma   . Constipation   . Contact lens/glasses fitting    wears contacts or glasses  . Environmental allergies   . Gallbladder problem   . History of gastric ulcer AS TEEN  . History of stomach ulcers   . History of viral pericarditis PROBABLE OR IDIOPATHIC PER D/C SUMMARY [facility] 2011   NO PROBLEMS SINCE  . Joint pain   . Lactose intolerance   . Multiple food allergies    coconut  . Osteoarthritis   . Pericarditis   . Primary localized osteoarthritis of right knee 11/25/2011   S/P Left Knee arthroscopy performed by Dr. Charlann Boxerlin in April 2013.   . Seasonal allergies   . Torn rotator cuff    Past Surgical History:  Procedure Laterality Date  . APPENDECTOMY  1998  . BILATERAL CARPAL TUNNEL RELEASE  1980'S  . BREAST REDUCTION SURGERY Bilateral 12/21/2012   Procedure: BILATERAL MAMMARY REDUCTION  (BREAST);  Surgeon: Etter Sjogrenavid Bowers, MD;  Location: [facility];  Service: Plastics;  Laterality: Bilateral;  . CHOLECYSTECTOMY  1992  . KNEE ARTHROSCOPY  06/20/2011   Procedure: ARTHROSCOPY KNEE;  Surgeon: Shelda PalMatthew D Olin, MD;  [facility];  Service: Orthopedics;  Laterality: Left;  left knee scope   latex allergy patient blisters and swells  . PARTIAL KNEE ARTHROPLASTY Right 03/03/2014   Procedure: RIGHT UNICOMPARTMENTAL KNEE ARTHROPLASTY;  Surgeon: Eulas PostJoshua P Landau, MD;  Location: [facility];  Service: Orthopedics;  Laterality: Right;  . TRANSTHORACIC ECHOCARDIOGRAM  04-26-2009   NORMAL LVSF/ EF 60-65% / LVDF NORMAL / NO PERICARDIAL EFFUSION  . VAGINAL HYSTERECTOMY  03-18-1999   Social History   Social History  . Marital status: Married    Spouse name: Fayrene FearingJames  . Number of children: 2  . Years of education: N/A   Occupational History  . organizational Dev. Practitioner    Social History Main Topics  . Smoking status: Never Smoker  . Smokeless tobacco: Never Used  . Alcohol use No  . Drug use: No  . Sexual activity: Yes    Partners: Male    Birth control/ protection: Surgical   Other Topics Concern  . Not on file   Social History Narrative  . No narrative on file   Allergies  Allergen Reactions  . Coconut Oil Anaphylaxis, Shortness Of Breath and Swelling    Coconut  . Latex Swelling    AND BLISTERS  . Sulfa Antibiotics Anaphylaxis   Family History  Problem Relation Age of Onset  . Cancer Brother   . Asthma Son     Past medical history, social, surgical and family history all reviewed in electronic medical record.  No pertanent information unless stated regarding to the chief complaint.   Review of Systems: No headache, visual changes, nausea, vomiting, diarrhea, constipation, dizziness, abdominal pain, skin rash, fevers, chills,  night sweats, weight loss, swollen lymph nodes, body aches, joint swelling, muscle aches, chest pain, shortness of breath, mood changes.    Objective  There were no vitals taken for this visit.   Systems examined below as of 07/11/16 General: NAD A&O x3 mood, affect normal  HEENT: Pupils equal, extraocular movements intact no nystagmus Respiratory: not short of breath at rest or with speaking Cardiovascular: No lower extremity edema, non tender Skin: Warm dry intact with no signs of infection or rash on extremities or on axial skeleton. Abdomen: Soft nontender, no masses Neuro: Cranial nerves  intact,  neurovascularly intact in all extremities with 2+ DTRs and 2+ pulses. Lymph: No lymphadenopathy appreciated today  Gait normal with good balance and coordination.  MSK: Non tender with full range of motion and good stability and symmetric strength and tone of  elbows, wrist,  knee hips and ankles bilaterally.   Neck: Inspection unremarkable. No palpable stepoffs. Negative Spurling's maneuver. Full neck range of motion Grip strength and sensation normal in bilateral hands Strength good C4 to T1 distribution No sensory change to C4 to T1 Negative Hoffman sign bilaterally Reflexes normal Shoulder: Left Inspection reveals no abnormalities, atrophy or asymmetry. Palpation is normal with no tenderness over AC joint or bicipital groove. ROM is full in all planes. Rotator cuff strength normal throughout. Mild impingement noted Speeds and Yergason's tests normal. No labral pathology noted with negative Obrien's, negative clunk and good stability. Normal scapular function observed. No painful arc and no drop arm sign. No apprehension sign  Osteopathic findings Cervical C2 flexed rotated and side bent right C4 flexed rotated and side bent left C6 flexed rotated and side bent left T3 extended rotated and side bent right inhaled third rib T9 extended rotated and side bent left L2 flexed rotated and side bent right Sacrum right on right       Impression and Recommendations:     This case required medical decision making of moderate complexity.      Note: This dictation was prepared with Dragon dictation along with smaller phrase technology. Any transcriptional errors that result from this process are unintentional.

## 2016-07-14 ENCOUNTER — Ambulatory Visit: Payer: 59 | Admitting: Family Medicine

## 2016-07-14 DIAGNOSIS — F4323 Adjustment disorder with mixed anxiety and depressed mood: Secondary | ICD-10-CM | POA: Diagnosis not present

## 2016-07-15 ENCOUNTER — Ambulatory Visit (INDEPENDENT_AMBULATORY_CARE_PROVIDER_SITE_OTHER): Payer: 59 | Admitting: Family Medicine

## 2016-07-22 ENCOUNTER — Ambulatory Visit (INDEPENDENT_AMBULATORY_CARE_PROVIDER_SITE_OTHER): Payer: 59 | Admitting: Family Medicine

## 2016-07-22 VITALS — BP 108/74 | HR 63 | Temp 97.9°F | Ht 63.0 in | Wt 169.0 lb

## 2016-07-22 DIAGNOSIS — Z6830 Body mass index (BMI) 30.0-30.9, adult: Secondary | ICD-10-CM | POA: Diagnosis not present

## 2016-07-22 DIAGNOSIS — E784 Other hyperlipidemia: Secondary | ICD-10-CM | POA: Diagnosis not present

## 2016-07-22 DIAGNOSIS — F3289 Other specified depressive episodes: Secondary | ICD-10-CM

## 2016-07-22 DIAGNOSIS — F32A Depression, unspecified: Secondary | ICD-10-CM | POA: Insufficient documentation

## 2016-07-22 DIAGNOSIS — E669 Obesity, unspecified: Secondary | ICD-10-CM

## 2016-07-22 DIAGNOSIS — F329 Major depressive disorder, single episode, unspecified: Secondary | ICD-10-CM | POA: Insufficient documentation

## 2016-07-22 DIAGNOSIS — E559 Vitamin D deficiency, unspecified: Secondary | ICD-10-CM

## 2016-07-22 DIAGNOSIS — E7849 Other hyperlipidemia: Secondary | ICD-10-CM

## 2016-07-22 MED ORDER — VITAMIN D (ERGOCALCIFEROL) 1.25 MG (50000 UNIT) PO CAPS: 50000.0000 [IU] | ORAL_CAPSULE | ORAL | 0 refills | Status: DC

## 2016-07-22 MED ORDER — BUPROPION HCL ER (SR) 150 MG PO TB12
150.0000 mg | ORAL_TABLET | Freq: Every day | ORAL | 0 refills | Status: DC
Start: 2016-07-22 — End: 2016-08-18

## 2016-07-22 MED FILL — BUPROPION SR 150 MG TABLET: 150 | 30 days supply | Qty: 30 | Fill #0

## 2016-07-23 NOTE — Progress Notes (Signed)
Office: [phone]  /  Fax: [phone]   HPI:   Chief Complaint: OBESITY Dawn [name] is here to discuss her progress with her obesity treatment plan. She is on the  follow the Category 1 plan and is following her eating plan approximately 75 % of the time. She states she is walking 30 to 45 minutes 2 to 3 times per week. Dawn [name] has had increased celebration eating and increased eating out. She is ready to get back on track but is bored with plan and deviating more. Her weight is 169 lb (76.7 kg) today and has had a weight gain of 1 pound over a period of 3 weeks since her last visit. She has lost 4 lbs since starting treatment with us.  Vitamin D deficiency Dawn [name] has a diagnosis of vitamin D deficiency. She is not yet at goal on vit D. Dawn [name] lost medication and out for 3 weeks. She denies nausea, vomiting or muscle weakness.  Depression with emotional eating behaviors Dawn [name] notes increased emotional eating and not following her diet prescription closely. Dawn [name] struggles with emotional eating and using food for comfort to the extent that it is negatively impacting her health. She often snacks when she is not hungry. Dawn [name] sometimes feels she is out of control and then feels guilty that she made poor food choices. She has been working on behavior modification techniques to help reduce her emotional eating and has been somewhat successful. She shows no sign of suicidal or homicidal ideations.  Hyperlipidemia Dawn [name] has hyperlipidemia and has been trying to control her cholesterol levels with intensive lifestyle modification including a low saturated fat diet, exercise and weight loss. She denies any chest pain, claudication or myalgias. LDL is above goal of 100.  [facility]  Decreased Interest 1  Down, Depressed, Hopeless 0  PHQ - 2 Score 1  Altered sleeping 1  Tired, decreased energy 1  Change in appetite 1  Feeling bad or failure about  yourself  0  Trouble concentrating 0  Moving slowly or fidgety/restless 0  Suicidal thoughts 0  PHQ-9 Score 4      ALLERGIES: Allergies  Allergen Reactions  . Coconut Oil Anaphylaxis, Shortness Of Breath and Swelling    Coconut  . Latex Swelling    AND BLISTERS  . Sulfa Antibiotics Anaphylaxis    MEDICATIONS: Current Outpatient Prescriptions on File Prior to Visit  Medication Sig Dispense Refill  . albuterol (PROVENTIL HFA;VENTOLIN HFA) 108 (90 BASE) MCG/ACT inhaler Inhale 2 puffs into the lungs every 6 (six) hours as needed for wheezing.    Marland Kitchen. b complex vitamins tablet Take 1 tablet by mouth daily.    . Biotin 5000 MCG CAPS Take by mouth every morning.    . docusate sodium (COLACE) 100 MG capsule Take 1 capsule (100 mg total) by mouth 2 (two) times daily. 60 capsule 0  . EPINEPHrine 0.3 mg/0.3 mL IJ SOAJ injection Inject into the muscle once.    Marland Kitchen. levocetirizine (XYZAL) 5 MG tablet Take 5 mg by mouth every evening.    . Multiple Vitamins-Minerals (MULTIVITAMIN WITH MINERALS) tablet Take by mouth daily.     Marland Kitchen. omega-3 acid ethyl esters (LOVAZA) 1 g capsule Take by mouth every morning.     No current facility-administered medications on file prior to visit.     PAST MEDICAL HISTORY: Past Medical History:  Diagnosis Date  . Acute meniscal tear of knee LEFT  . Arthritis   . Asthma   . Constipation   .  Contact lens/glasses fitting    wears contacts or glasses  . Environmental allergies   . Gallbladder problem   . History of gastric ulcer AS TEEN  . History of stomach ulcers   . History of viral pericarditis PROBABLE OR IDIOPATHIC PER D/C SUMMARY MARCH 2011   NO PROBLEMS SINCE  . Joint pain   . Lactose intolerance   . Multiple food allergies    coconut  . Osteoarthritis   . Pericarditis   . Primary localized osteoarthritis of right knee 11/25/2011   S/P Left Knee arthroscopy performed by Dr. Charlann Boxer in April 2013.   . Seasonal allergies   . Torn rotator cuff     PAST  SURGICAL HISTORY: Past Surgical History:  Procedure Laterality Date  . APPENDECTOMY  1998  . BILATERAL CARPAL TUNNEL RELEASE  1980'S  . BREAST REDUCTION SURGERY Bilateral 12/21/2012   Procedure: BILATERAL MAMMARY REDUCTION  (BREAST);  Surgeon: Etter Sjogren, MD;  Location: [facility];  Service: Plastics;  Laterality: Bilateral;  . CHOLECYSTECTOMY  1992  . KNEE ARTHROSCOPY  06/20/2011   Procedure: ARTHROSCOPY KNEE;  Surgeon: Shelda Pal, MD;  Location: [facility];  Service: Orthopedics;  Laterality: Left;  left knee scope   latex allergy patient blisters and swells  . PARTIAL KNEE ARTHROPLASTY Right 03/03/2014   Procedure: RIGHT UNICOMPARTMENTAL KNEE ARTHROPLASTY;  Surgeon: Eulas Post, MD;  Location: [facility];  Service: Orthopedics;  Laterality: Right;  . TRANSTHORACIC ECHOCARDIOGRAM  04-26-2009   NORMAL LVSF/ EF 60-65% / LVDF NORMAL / NO PERICARDIAL EFFUSION  . VAGINAL HYSTERECTOMY  03-18-1999    SOCIAL HISTORY: Social History  Substance Use Topics  . Smoking status: Never Smoker  . Smokeless tobacco: Never Used  . Alcohol use No    FAMILY HISTORY: Family History  Problem Relation Age of Onset  . Cancer Brother   . Asthma Son     ROS: Review of Systems  Constitutional: Negative for weight loss.  Cardiovascular: Negative for chest pain and claudication.  Gastrointestinal: Negative for nausea and vomiting.  Musculoskeletal: Negative for myalgias.       Negative muscle weakness  Psychiatric/Behavioral: Positive for depression. Negative for suicidal ideas.    PHYSICAL EXAM: Blood pressure 108/74, pulse 63, temperature 97.9 F (36.6 C), temperature source Oral, height 5\' 3"  (1.6 m), weight 169 lb (76.7 kg), SpO2 100 %. Body mass index is 29.94 kg/m. Physical Exam  Constitutional: She is oriented to person, place, and time. She appears well-developed and well-nourished.  Cardiovascular: Normal rate.   Pulmonary/Chest:  Effort normal.  Musculoskeletal: Normal range of motion.  Neurological: She is oriented to person, place, and time.  Skin: Skin is warm and dry.  Psychiatric: She has a normal mood and affect. Her behavior is normal.  Vitals reviewed.   RECENT LABS AND TESTS: BMET    Component Value Date/Time   NA 141 05/20/2016 0954   K 4.7 05/20/2016 0954   CL 100 05/20/2016 0954   CO2 27 05/20/2016 0954   GLUCOSE 77 05/20/2016 0954   GLUCOSE 89 03/09/2012 1915   BUN 20 05/20/2016 0954   CREATININE 0.82 05/20/2016 0954   CALCIUM 9.8 05/20/2016 0954   GFRNONAA 83 05/20/2016 0954   GFRAA 95 05/20/2016 0954   Lab Results  Component Value Date   HGBA1C 4.9 05/20/2016   Lab Results  Component Value Date   INSULIN 5.7 05/20/2016   CBC    Component Value Date/Time   WBC 10.3  05/20/2016 0954   WBC 11.5 (H) 03/09/2012 1915   RBC 4.22 05/20/2016 0954   RBC 4.32 03/09/2012 1915   HGB 12.8 03/03/2014 1252   HCT 40.1 05/20/2016 0954   PLT 343 05/20/2016 0954   MCV 95 05/20/2016 0954   MCH 30.6 05/20/2016 0954   MCH 31.3 03/09/2012 1915   MCHC 32.2 05/20/2016 0954   MCHC 34.0 03/09/2012 1915   RDW 13.2 05/20/2016 0954   LYMPHSABS 2.6 05/20/2016 0954   MONOABS 0.6 03/09/2012 1915   EOSABS 0.1 05/20/2016 0954   BASOSABS 0.0 05/20/2016 0954   Iron/TIBC/Ferritin/ %Sat No results found for: IRON, TIBC, FERRITIN, IRONPCTSAT Lipid Panel     Component Value Date/Time   CHOL 178 05/20/2016 0954   TRIG 61 05/20/2016 0954   HDL 55 05/20/2016 0954   LDLCALC 111 (H) 05/20/2016 0954   Hepatic Function Panel     Component Value Date/Time   PROT 7.9 05/20/2016 0954   ALBUMIN 4.6 05/20/2016 0954   AST 18 05/20/2016 0954   ALT 19 05/20/2016 0954   ALKPHOS 64 05/20/2016 0954   BILITOT 0.3 05/20/2016 0954      Component Value Date/Time   TSH 2.040 05/20/2016 0954    ASSESSMENT AND PLAN: Vitamin D deficiency - Plan: Vitamin D, Ergocalciferol, (DRISDOL) 50000 units CAPS capsule  Other  hyperlipidemia  Other depression - Plan: buPROPion (WELLBUTRIN SR) 150 MG 12 hr tablet  Class 1 obesity without serious comorbidity with body mass index (BMI) of 30.0 to 30.9 in adult, unspecified obesity type  PLAN:  Vitamin D Deficiency Martasia was informed that low vitamin D levels contributes to fatigue and are associated with obesity, breast, and colon cancer. She agrees to continue to take prescription Vit D @50 ,000 IU every week, we will refill for 1 month and will re-check labs in 1 month and will follow up for routine testing of vitamin D, at least 2-3 times per year. She was informed of the risk of over-replacement of vitamin D and agrees to not increase her dose unless he discusses this with [place] first. Dawn [name] agrees to follow up with our clinic in 2 weeks.  Depression with Emotional Eating Behaviors We discussed behavior modification techniques today to help Dawn [name] deal with her emotional eating and depression. She has agreed to start to  take Wellbutrin SR 150 mg every morning #30 with no refills and agreed to follow up as directed.  Hyperlipidemia Dawn [name] was informed of the American Heart Association Guidelines emphasizing intensive lifestyle modifications as the first line treatment for hyperlipidemia. We discussed many lifestyle modifications today in depth, and Dawn [name] will continue to work on decreasing saturated fats such as fatty red meat, butter and many fried foods. She will also increase vegetables and lean protein in her diet and continue to work on exercise and weight loss efforts. We will re-check labs in 1 month and Dawn [name] agrees to follow up as directed.  Obesity Dawn [name] is currently in the action stage of change. As such, her goal is to continue with weight loss efforts She has agreed to [employer] has been instructed to work up to a goal of 150 minutes of combined cardio and strengthening exercise per week or continue walking for 30  to 45 minutes 2 to 3 times per week for weight loss and overall health benefits. We discussed the following Behavioral Modification Strategies today: no skipping meals, increasing H2O and increasing lean protein intake  Dawn [name] has agreed to follow up with our clinic  in 2 weeks. She was informed of the importance of frequent follow up visits to maximize her success with intensive lifestyle modifications for her multiple health conditions.  I, Nevada Crane, am acting as scribe for Quillian Quince, MD  I have reviewed the above documentation for accuracy and completeness, and I agree with the above. -Quillian Quince, MD  OBESITY BEHAVIORAL INTERVENTION VISIT  Today's visit was # 5 out of 22.  Starting weight: 173 lbs Starting date: 05/20/16 Today's weight : 169 lbs Today's date: 07/22/16 Total lbs lost to date: 4 (Patients must lose 7 lbs in the first 6 months to continue with counseling)   ASK: We discussed the diagnosis of obesity with Dawn [name] today and Dawn [name] agreed to give [place] permission to discuss obesity behavioral modification therapy today.  ASSESS: Dawn [name] has the diagnosis of obesity and her BMI today is 47 Dawn [name] is in the action stage of change   ADVISE: Dawn [name] was educated on the multiple health risks of obesity as well as the benefit of weight loss to improve her health. She was advised of the need for long term treatment and the importance of lifestyle modifications.  AGREE: Multiple dietary modification options and treatment options were discussed and  Dawn [name] agreed to [employer] We discussed the following Behavioral Modification Strategies today: no skipping meals, increasing H2O and increasing lean protein intake.

## 2016-08-05 ENCOUNTER — Ambulatory Visit (INDEPENDENT_AMBULATORY_CARE_PROVIDER_SITE_OTHER): Payer: 59 | Admitting: Family Medicine

## 2016-08-05 VITALS — BP 104/67 | HR 72 | Temp 98.6°F | Ht 63.0 in | Wt 166.0 lb

## 2016-08-05 DIAGNOSIS — Z6830 Body mass index (BMI) 30.0-30.9, adult: Secondary | ICD-10-CM | POA: Diagnosis not present

## 2016-08-05 DIAGNOSIS — F3289 Other specified depressive episodes: Secondary | ICD-10-CM | POA: Diagnosis not present

## 2016-08-05 DIAGNOSIS — E669 Obesity, unspecified: Secondary | ICD-10-CM

## 2016-08-05 DIAGNOSIS — E559 Vitamin D deficiency, unspecified: Secondary | ICD-10-CM | POA: Diagnosis not present

## 2016-08-05 NOTE — Progress Notes (Signed)
Office: [phone]  /  Fax: [phone]   HPI:   Chief Complaint: OBESITY Dawn [name] is here to discuss her progress with her obesity treatment plan. She is on the  follow our protein rich vegetarian plan and is following her eating plan approximately 90 % of the time. She states she is walking 30 minutes 3 times per week. Dawn [name] continues to do well with weight loss. She substitutes chicken for fish occasionally. Her weight is 166 lb (75.3 kg) today and has had a weight loss of 3 pounds over a period of 2 weeks since her last visit. She has lost 7 lbs since starting treatment with [place].  Vitamin D deficiency Dawn [name] has a diagnosis of vitamin D deficiency. She is currently taking vit D and denies nausea, vomiting or muscle weakness.  Depression with emotional eating behaviors Dawn [name] is struggling with emotional eating and using food for comfort to the extent that it is negatively impacting her health. She often snacks when she is not hungry. Dawn [name] sometimes feels she is out of control and then feels guilty that she made poor food choices. She has been working on behavior modification techniques to help reduce her emotional eating and has been somewhat successful. Dawn [name] denies insomnia and she shows no sign of suicidal or homicidal ideations.  Depression screen Dawn [name] - [facility] 2/9 05/20/2016  Decreased Interest 1  Down, Depressed, Hopeless 0  PHQ - 2 Score 1  Altered sleeping 1  Tired, decreased energy 1  Change in appetite 1  Feeling bad or failure about yourself  0  Trouble concentrating 0  Moving slowly or fidgety/restless 0  Suicidal thoughts 0  PHQ-9 Score 4     ALLERGIES: Allergies  Allergen Reactions   Coconut Oil Anaphylaxis, Shortness Of Breath and Swelling    Coconut   Latex Swelling    AND BLISTERS   Sulfa Antibiotics Anaphylaxis    MEDICATIONS: Current Outpatient Prescriptions on File Prior to Visit  Medication Sig Dispense Refill   albuterol  (PROVENTIL HFA;VENTOLIN HFA) 108 (90 BASE) MCG/ACT inhaler Inhale 2 puffs into the lungs every 6 (six) hours as needed for wheezing.     b complex vitamins tablet Take 1 tablet by mouth daily.     Biotin 5000 MCG CAPS Take by mouth every morning.     buPROPion (WELLBUTRIN SR) 150 MG 12 hr tablet Take 1 tablet (150 mg total) by mouth daily. 30 tablet 0   docusate sodium (COLACE) 100 MG capsule Take 1 capsule (100 mg total) by mouth 2 (two) times daily. 60 capsule 0   EPINEPHrine 0.3 mg/0.3 mL IJ SOAJ injection Inject into the muscle once.     levocetirizine (XYZAL) 5 MG tablet Take 5 mg by mouth every evening.     Multiple Vitamins-Minerals (MULTIVITAMIN WITH MINERALS) tablet Take by mouth daily.      omega-3 acid ethyl esters (LOVAZA) 1 g capsule Take by mouth every morning.     Vitamin D, Ergocalciferol, (DRISDOL) 50000 units CAPS capsule Take 1 capsule (50,000 Units total) by mouth every 7 (seven) days. 4 capsule 0   No current facility-administered medications on file prior to visit.     PAST MEDICAL HISTORY: Past Medical History:  Diagnosis Date   Acute meniscal tear of knee LEFT   Arthritis    Asthma    Constipation    Contact lens/glasses fitting    wears contacts or glasses   Environmental allergies    Gallbladder problem    History of gastric ulcer  AS TEEN   History of stomach ulcers    History of viral pericarditis PROBABLE OR IDIOPATHIC PER D/C SUMMARY MARCH 2011   NO PROBLEMS SINCE   Joint pain    Lactose intolerance    Multiple food allergies    coconut   Osteoarthritis    Pericarditis    Primary localized osteoarthritis of right knee 11/25/2011   S/P Left Knee arthroscopy performed by Dr. Charlann Boxer in April 2013.    Seasonal allergies    Torn rotator cuff     PAST SURGICAL HISTORY: Past Surgical History:  Procedure Laterality Date   APPENDECTOMY  1998   BILATERAL CARPAL TUNNEL RELEASE  1980'S   BREAST REDUCTION SURGERY Bilateral  12/21/2012   Procedure: BILATERAL MAMMARY REDUCTION  (BREAST);  Surgeon: Etter Sjogren, MD;  Location: [facility];  Service: Plastics;  Laterality: Bilateral;   CHOLECYSTECTOMY  1992   KNEE ARTHROSCOPY  06/20/2011   Procedure: ARTHROSCOPY KNEE;  Surgeon: Shelda Pal, MD;  Location: [facility];  Service: Orthopedics;  Laterality: Left;  left knee scope   latex allergy patient blisters and swells   PARTIAL KNEE ARTHROPLASTY Right 03/03/2014   Procedure: RIGHT UNICOMPARTMENTAL KNEE ARTHROPLASTY;  Surgeon: Eulas Post, MD;  [facility];  Service: Orthopedics;  Laterality: Right;   TRANSTHORACIC ECHOCARDIOGRAM  04-26-2009   NORMAL LVSF/ EF 60-65% / LVDF NORMAL / NO PERICARDIAL EFFUSION   VAGINAL HYSTERECTOMY  03-18-1999    SOCIAL HISTORY: Social History  Substance Use Topics   Smoking status: Never Smoker   Smokeless tobacco: Never Used   Alcohol use No    FAMILY HISTORY: Family History  Problem Relation Age of Onset   Cancer Brother    Asthma Son     ROS: Review of Systems  Constitutional: Positive for weight loss.  Gastrointestinal: Negative for nausea and vomiting.  Musculoskeletal:       Negative muscle weakness  Psychiatric/Behavioral: Positive for depression. Negative for suicidal ideas. The patient does not have insomnia.     PHYSICAL EXAM: Blood pressure 104/67, pulse 72, temperature 98.6 F (37 C), temperature source Oral, height 5\' 3"  (1.6 m), weight 166 lb (75.3 kg), SpO2 98 %. Body mass index is 29.41 kg/m. Physical Exam  Constitutional: She is oriented to person, place, and time. She appears well-developed and well-nourished.  Cardiovascular: Normal rate.   Pulmonary/Chest: Effort normal.  Musculoskeletal: Normal range of motion.  Neurological: She is oriented to person, place, and time.  Skin: Skin is warm and dry.  Psychiatric: She has a normal mood and affect. Her behavior is normal.    Vitals reviewed.   RECENT LABS AND TESTS: BMET    Component Value Date/Time   NA 141 05/20/2016 0954   K 4.7 05/20/2016 0954   CL 100 05/20/2016 0954   CO2 27 05/20/2016 0954   GLUCOSE 77 05/20/2016 0954   GLUCOSE 89 03/09/2012 1915   BUN 20 05/20/2016 0954   CREATININE 0.82 05/20/2016 0954   CALCIUM 9.8 05/20/2016 0954   GFRNONAA 83 05/20/2016 0954   GFRAA 95 05/20/2016 0954   Lab Results  Component Value Date   HGBA1C 4.9 05/20/2016   Lab Results  Component Value Date   INSULIN 5.7 05/20/2016   CBC    Component Value Date/Time   WBC 10.3 05/20/2016 0954   WBC 11.5 (H) 03/09/2012 1915   RBC 4.22 05/20/2016 0954   RBC 4.32 03/09/2012 1915   HGB 12.9 05/20/2016 0954  HCT 40.1 05/20/2016 0954   PLT 343 05/20/2016 0954   MCV 95 05/20/2016 0954   MCH 30.6 05/20/2016 0954   MCH 31.3 03/09/2012 1915   MCHC 32.2 05/20/2016 0954   MCHC 34.0 03/09/2012 1915   RDW 13.2 05/20/2016 0954   LYMPHSABS 2.6 05/20/2016 0954   MONOABS 0.6 03/09/2012 1915   EOSABS 0.1 05/20/2016 0954   BASOSABS 0.0 05/20/2016 0954   Iron/TIBC/Ferritin/ %Sat No results found for: IRON, TIBC, FERRITIN, IRONPCTSAT Lipid Panel     Component Value Date/Time   CHOL 178 05/20/2016 0954   TRIG 61 05/20/2016 0954   HDL 55 05/20/2016 0954   LDLCALC 111 (H) 05/20/2016 0954   Hepatic Function Panel     Component Value Date/Time   PROT 7.9 05/20/2016 0954   ALBUMIN 4.6 05/20/2016 0954   AST 18 05/20/2016 0954   ALT 19 05/20/2016 0954   ALKPHOS 64 05/20/2016 0954   BILITOT 0.3 05/20/2016 0954      Component Value Date/Time   TSH 2.040 05/20/2016 0954    ASSESSMENT AND PLAN: Vitamin D deficiency  Other depression  Class 1 obesity without serious comorbidity with body mass index (BMI) of 30.0 to 30.9 in adult, unspecified obesity type - BMI >30 when patient started program  PLAN:  Vitamin D Deficiency Dawn [name] was informed that low vitamin D levels contributes to fatigue and are  associated with obesity, breast, and colon cancer. She agrees to continue to take prescription Vit D @50 ,000 IU every week and will follow up for routine testing of vitamin D, at least 2-3 times per year. She was informed of the risk of over-replacement of vitamin D and agrees to not increase her dose unless he discusses this with us first.  Depression with Emotional Eating Behaviors We discussed behavior modification techniques today to help Dawn [name] deal with her emotional eating and depression. She has agreed to continue Wellbutrin SR 150 mg qd and agreed to follow up as directed.  Obesity Dawn [name] is currently in the action stage of change. As such, her goal is to continue with weight loss efforts She has agreed to follow the Pescatarian eating plan Dawn [name] has been instructed to work up to a goal of 150 minutes of combined cardio and strengthening exercise per week for weight loss and overall health benefits. We discussed the following Behavioral Modification Strategies today: increase H2O intake and celebration eating strategies  Dawn [name] has agreed to follow up with our clinic in 2 weeks. She was informed of the importance of frequent follow up visits to maximize her success with intensive lifestyle modifications for her multiple health conditions.  I, [place] CraneJoanne Murray, am acting as scribe for Quillian Quincearen Beasley, MD  I have reviewed the above documentation for accuracy and completeness, and I agree with the above. -Quillian Quincearen Beasley, MD  OBESITY BEHAVIORAL INTERVENTION VISIT  Today's visit was # 6 out of 22.  Starting weight: 173 lbs Starting date: 05/20/16 Today's weight : 166 lbs  Today's date: 08/05/2016 Total lbs lost to date: 29.5 (Patients must lose 7 lbs in the first 6 months to continue with counseling)   ASK: We discussed the diagnosis of obesity with Dawn Schmidonstance M Choma today and Dawn [name] agreed to give us permission to discuss obesity behavioral modification therapy  today.  ASSESS: Dawn [name] has the diagnosis of obesity and her BMI today is 29.5 Dawn [name] is in the action stage of change   ADVISE: Dawn [name] was educated on the multiple health risks of obesity as well as the  benefit of weight loss to improve her health. She was advised of the need for long term treatment and the importance of lifestyle modifications.  AGREE: Multiple dietary modification options and treatment options were discussed and  Machell agreed to follow the Pescatarian eating plan We discussed the following Behavioral Modification Strategies today: increase H2O intake and celebration eating strategies

## 2016-08-12 MED FILL — VIT D2 1.25 MG (50,000 UNIT: 1.25 MG | 56 days supply | Qty: 8 | Fill #0

## 2016-08-14 DIAGNOSIS — Z01419 Encounter for gynecological examination (general) (routine) without abnormal findings: Secondary | ICD-10-CM | POA: Diagnosis not present

## 2016-08-14 DIAGNOSIS — Z1231 Encounter for screening mammogram for malignant neoplasm of breast: Secondary | ICD-10-CM | POA: Diagnosis not present

## 2016-08-14 DIAGNOSIS — Z6828 Body mass index (BMI) 28.0-28.9, adult: Secondary | ICD-10-CM | POA: Diagnosis not present

## 2016-08-14 MED FILL — ESTRADIOL 1 MG TAB: 1 | 90 days supply | Qty: 90 | Fill #0

## 2016-08-18 ENCOUNTER — Ambulatory Visit (INDEPENDENT_AMBULATORY_CARE_PROVIDER_SITE_OTHER): Payer: 59 | Admitting: Family Medicine

## 2016-08-18 VITALS — BP 120/73 | HR 69 | Temp 97.9°F | Ht 63.0 in | Wt 164.0 lb

## 2016-08-18 DIAGNOSIS — Z6830 Body mass index (BMI) 30.0-30.9, adult: Secondary | ICD-10-CM

## 2016-08-18 DIAGNOSIS — F3289 Other specified depressive episodes: Secondary | ICD-10-CM | POA: Diagnosis not present

## 2016-08-18 DIAGNOSIS — E559 Vitamin D deficiency, unspecified: Secondary | ICD-10-CM

## 2016-08-18 DIAGNOSIS — E669 Obesity, unspecified: Secondary | ICD-10-CM | POA: Diagnosis not present

## 2016-08-18 MED ORDER — BUPROPION HCL ER (SR) 200 MG PO TB12: 200.0000 mg | ORAL_TABLET | Freq: Every day | ORAL | 0 refills | Status: DC

## 2016-08-18 MED ORDER — VITAMIN D (ERGOCALCIFEROL) 1.25 MG (50000 UNIT) PO CAPS: 50000.0000 [IU] | ORAL_CAPSULE | ORAL | 0 refills | Status: DC

## 2016-08-18 MED FILL — BUPROPION HCL SR 200 MG TAB: 200 | 30 days supply | Qty: 30 | Fill #0

## 2016-08-18 NOTE — Progress Notes (Signed)
Office: [phone]  /  Fax: [phone]   HPI:   Chief Complaint: OBESITY Dawn [name] is here to discuss her progress with her obesity treatment plan. She is on the  follow our protein rich vegetarian plan and is following her eating plan approximately 85 % of the time. She states she is walking 30 minutes 3 times per week. Dawn [name] continues to do well with weight loss. She has been planning ahead well with increased stress at home. She has had increase in cravings. Her weight is 164 lb (74.4 kg) today and has had a weight loss of 2 pounds over a period of 2 weeks since her last visit. She has lost 9 lbs since starting treatment with [place].  Vitamin D deficiency Dawn [name] has a diagnosis of vitamin D deficiency. She is currently taking vit D and denies nausea, vomiting or muscle weakness.  Depression with emotional eating behaviors Dawn [name] is struggling with emotional eating and using food for comfort to the extent that it is negatively impacting her health. She often snacks when she is not hungry. Dawn [name] sometimes feels she is out of control and then feels guilty that she made poor food choices. She has been working on behavior modification techniques to help reduce her emotional eating and has been somewhat successful. She shows no sign of suicidal or homicidal ideations.  Depression screen Dawn [name] 2/9 05/20/2016  Decreased Interest 1  Down, Depressed, Hopeless 0  PHQ - 2 Score 1  Altered sleeping 1  Tired, decreased energy 1  Change in appetite 1  Feeling bad or failure about yourself  0  Trouble concentrating 0  Moving slowly or fidgety/restless 0  Suicidal thoughts 0  PHQ-9 Score 4      ALLERGIES: Allergies  Allergen Reactions  . Coconut Oil Anaphylaxis, Shortness Of Breath and Swelling    Coconut  . Latex Swelling    AND BLISTERS  . Sulfa Antibiotics Anaphylaxis    MEDICATIONS: Current Outpatient Prescriptions on File Prior to Visit  Medication Sig Dispense  Refill  . albuterol (PROVENTIL HFA;VENTOLIN HFA) 108 (90 BASE) MCG/ACT inhaler Inhale 2 puffs into the lungs every 6 (six) hours as needed for wheezing.    Marland Kitchen b complex vitamins tablet Take 1 tablet by mouth daily.    . Biotin 5000 MCG CAPS Take by mouth every morning.    . docusate sodium (COLACE) 100 MG capsule Take 1 capsule (100 mg total) by mouth 2 (two) times daily. 60 capsule 0  . EPINEPHrine 0.3 mg/0.3 mL IJ SOAJ injection Inject into the muscle once.    Marland Kitchen levocetirizine (XYZAL) 5 MG tablet Take 5 mg by mouth every evening.    . Multiple Vitamins-Minerals (MULTIVITAMIN WITH MINERALS) tablet Take by mouth daily.     Marland Kitchen omega-3 acid ethyl esters (LOVAZA) 1 g capsule Take by mouth every morning.    . Vitamin D, Ergocalciferol, (DRISDOL) 50000 units CAPS capsule Take 1 capsule (50,000 Units total) by mouth every 7 (seven) days. 4 capsule 0   No current facility-administered medications on file prior to visit.     PAST MEDICAL HISTORY: Past Medical History:  Diagnosis Date  . Acute meniscal tear of knee LEFT  . Arthritis   . Asthma   . Constipation   . Contact lens/glasses fitting    wears contacts or glasses  . Environmental allergies   . Gallbladder problem   . History of gastric ulcer AS TEEN  . History of stomach ulcers   . History of viral  pericarditis PROBABLE OR IDIOPATHIC PER D/C SUMMARY MARCH 2011   NO PROBLEMS SINCE  . Joint pain   . Lactose intolerance   . Multiple food allergies    coconut  . Osteoarthritis   . Pericarditis   . Primary localized osteoarthritis of right knee 11/25/2011   S/P Left Knee arthroscopy performed by Dr. Charlann Boxer in April 2013.   . Seasonal allergies   . Torn rotator cuff     PAST SURGICAL HISTORY: Past Surgical History:  Procedure Laterality Date  . APPENDECTOMY  1998  . BILATERAL CARPAL TUNNEL RELEASE  1980'S  . BREAST REDUCTION SURGERY Bilateral 12/21/2012   Procedure: BILATERAL MAMMARY REDUCTION  (BREAST);  Surgeon: Etter Sjogren, MD;   Location: [name];  Service: Plastics;  Laterality: Bilateral;  . CHOLECYSTECTOMY  1992  . KNEE ARTHROSCOPY  06/20/2011   Procedure: ARTHROSCOPY KNEE;  Surgeon: Shelda Pal, MD;  Location: [facility];  Service: Orthopedics;  Laterality: Left;  left knee scope   latex allergy patient blisters and swells  . PARTIAL KNEE ARTHROPLASTY Right 03/03/2014   Procedure: RIGHT UNICOMPARTMENTAL KNEE ARTHROPLASTY;  Surgeon: Eulas Post, MD;  Location: [name];  Service: Orthopedics;  Laterality: Right;  . TRANSTHORACIC ECHOCARDIOGRAM  04-26-2009   NORMAL LVSF/ EF 60-65% / LVDF NORMAL / NO PERICARDIAL EFFUSION  . VAGINAL HYSTERECTOMY  03-18-1999    SOCIAL HISTORY: Social History  Substance Use Topics  . Smoking status: Never Smoker  . Smokeless tobacco: Never Used  . Alcohol use No    FAMILY HISTORY: Family History  Problem Relation Age of Onset  . Cancer Brother   . Asthma Son     ROS: Review of Systems  Constitutional: Positive for weight loss.  Gastrointestinal: Negative for nausea and vomiting.  Musculoskeletal:       Negative muscle weakness  Psychiatric/Behavioral: Positive for depression. Negative for suicidal ideas.    PHYSICAL EXAM: Blood pressure 120/73, pulse 69, temperature 97.9 F (36.6 C), temperature source Oral, height 5\' 3"  (1.6 m), weight 164 lb (74.4 kg), SpO2 100 %. Body mass index is 29.05 kg/m. Physical Exam  Constitutional: She is oriented to person, place, and time. She appears well-developed and well-nourished.  Cardiovascular: Normal rate.   Pulmonary/Chest: Effort normal.  Musculoskeletal: Normal range of motion.  Neurological: She is oriented to person, place, and time.  Skin: Skin is warm and dry.  Psychiatric: She has a normal mood and affect. Her behavior is normal.  Vitals reviewed.   RECENT LABS AND TESTS: BMET    Component Value Date/Time   NA 141 05/20/2016 0954   K 4.7 05/20/2016  0954   CL 100 05/20/2016 0954   CO2 27 05/20/2016 0954   GLUCOSE 77 05/20/2016 0954   GLUCOSE 89 03/09/2012 1915   BUN 20 05/20/2016 0954   CREATININE 0.82 05/20/2016 0954   CALCIUM 9.8 05/20/2016 0954   GFRNONAA 83 05/20/2016 0954   GFRAA 95 05/20/2016 0954   Lab Results  Component Value Date   HGBA1C 4.9 05/20/2016   Lab Results  Component Value Date   INSULIN 5.7 05/20/2016   CBC    Component Value Date/Time   WBC 10.3 05/20/2016 0954   WBC 11.5 (H) 03/09/2012 1915   RBC 4.22 05/20/2016 0954   RBC 4.32 03/09/2012 1915   HGB 12.9 05/20/2016 0954   HCT 40.1 05/20/2016 0954   PLT 343 05/20/2016 0954   MCV 95 05/20/2016 0954   MCH 30.6 05/20/2016 0954  MCH 31.3 03/09/2012 1915   MCHC 32.2 05/20/2016 0954   MCHC 34.0 03/09/2012 1915   RDW 13.2 05/20/2016 0954   LYMPHSABS 2.6 05/20/2016 0954   MONOABS 0.6 03/09/2012 1915   EOSABS 0.1 05/20/2016 0954   BASOSABS 0.0 05/20/2016 0954   Iron/TIBC/Ferritin/ %Sat No results found for: IRON, TIBC, FERRITIN, IRONPCTSAT Lipid Panel     Component Value Date/Time   CHOL 178 05/20/2016 0954   TRIG 61 05/20/2016 0954   HDL 55 05/20/2016 0954   LDLCALC 111 (H) 05/20/2016 0954   Hepatic Function Panel     Component Value Date/Time   PROT 7.9 05/20/2016 0954   ALBUMIN 4.6 05/20/2016 0954   AST 18 05/20/2016 0954   ALT 19 05/20/2016 0954   ALKPHOS 64 05/20/2016 0954   BILITOT 0.3 05/20/2016 0954      Component Value Date/Time   TSH 2.040 05/20/2016 0954    ASSESSMENT AND PLAN: Other depression  Vitamin D deficiency  Class 1 obesity without serious comorbidity with body mass index (BMI) of 30.0 to 30.9 in adult, unspecified obesity type - Starting BMI greater then 30  PLAN:  Vitamin D Deficiency Dawn [name] was informed that low vitamin D levels contributes to fatigue and are associated with obesity, breast, and colon cancer. She agrees to continue to take prescription Vit D @50 ,000 IU every week, we will refill  for 1 month and will follow up for routine testing of vitamin D, at least 2-3 times per year. She was informed of the risk of over-replacement of vitamin D and agrees to not increase her dose unless he discusses this with us first. Dawn [name] agrees to follow up with our clinic in 3 weeks.  Depression with Emotional Eating Behaviors We discussed behavior modification techniques today to help Dawn [name] deal with her emotional eating and depression. She has agreed to increase  Wellbutrin SR to 200 mg qd #30 with no refills and follow up as directed.  Obesity Dawn [name] is currently in the action stage of change. As such, her goal is to continue with weight loss efforts She has agreed to keep a food journal with 350 to 450 calories and 35+ grams of protein  and follow the Category 1 plan Dawn [name] has been instructed to work up to a goal of 150 minutes of combined cardio and strengthening exercise per week for weight loss and overall health benefits. We discussed the following Behavioral Modification Strategies today: increasing lean protein intake and emotional eating strategies  Dawn [name] has agreed to follow up with our clinic in 3 weeks. She was informed of the importance of frequent follow up visits to maximize her success with intensive lifestyle modifications for her multiple health conditions.  I, [place] CraneJoanne Murray, am acting as transcriptionist for Quillian Quincearen Beasley, MD  I have reviewed the above documentation for accuracy and completeness, and I agree with the above. -Quillian Quincearen Beasley, MD  OBESITY BEHAVIORAL INTERVENTION VISIT  Today's visit was # 7 out of 22.  Starting weight: 173 lbs Starting date: 05/20/16 Today's weight : 164 lbs Today's date: 08/18/2016 Total lbs lost to date: 9 (Patients must lose 7 lbs in the first 6 months to continue with counseling)   ASK: We discussed the diagnosis of obesity with Dawn [name] today and Dawn [name] agreed to give us permission to discuss  obesity behavioral modification therapy today.  ASSESS: Dawn [name] has the diagnosis of obesity and her BMI today is 29.1 Dawn [name] is in the action stage of change   ADVISE: Dawn [name] was  educated on the multiple health risks of obesity as well as the benefit of weight loss to improve her health. She was advised of the need for long term treatment and the importance of lifestyle modifications.  AGREE: Multiple dietary modification options and treatment options were discussed and  Dawn [name] agreed to keep a food journal with 350 to 450 calories and 35+ grams of protein at supper daily and follow the Category 1 plan We discussed the following Behavioral Modification Strategies today: increasing lean protein intake and emotional eating strategies

## 2016-09-01 ENCOUNTER — Ambulatory Visit (INDEPENDENT_AMBULATORY_CARE_PROVIDER_SITE_OTHER): Payer: 59 | Admitting: Physician Assistant

## 2016-09-01 VITALS — BP 108/72 | HR 68 | Temp 98.0°F | Ht 63.0 in | Wt 161.0 lb

## 2016-09-01 DIAGNOSIS — E669 Obesity, unspecified: Secondary | ICD-10-CM | POA: Diagnosis not present

## 2016-09-01 DIAGNOSIS — F3289 Other specified depressive episodes: Secondary | ICD-10-CM

## 2016-09-01 DIAGNOSIS — E785 Hyperlipidemia, unspecified: Secondary | ICD-10-CM

## 2016-09-01 DIAGNOSIS — Z6830 Body mass index (BMI) 30.0-30.9, adult: Secondary | ICD-10-CM

## 2016-09-01 DIAGNOSIS — E559 Vitamin D deficiency, unspecified: Secondary | ICD-10-CM | POA: Diagnosis not present

## 2016-09-01 NOTE — Progress Notes (Signed)
Office: [phone]  /  Fax: [phone]   HPI:   Chief Complaint: OBESITY Dawn [name] is here to discuss her progress with her obesity treatment plan. She is on the  follow the Category 1 plan and is following her eating plan approximately 50 % of the time. She states she is walking for 30 minutes 1.5 times per week. Dawn [name] continues to do well with weight loss. She has been planning ahead well. Her weight is 161 lb (73 kg) today and has had a weight loss of 3 pounds over a period of 2 weeks since her last visit. She has lost 12 lbs since starting treatment with [place].  Vitamin D deficiency Dawn [name] has a diagnosis of vitamin D deficiency. She is currently taking vit D and denies nausea, vomiting or muscle weakness.  Depression with emotional eating behaviors Dawn [name] is struggling with emotional eating and using food for comfort to the extent that it is negatively impacting her health. She often snacks when she is not hungry. Dawn [name] sometimes feels she is out of control and then feels guilty that she made poor food choices. She has been working on behavior modification techniques to help reduce her emotional eating and has been somewhat successful. She shows no sign of suicidal or homicidal ideations.  Hyperlipidemia Dawn [name] has hyperlipidemia, not on statin and has been trying to improve her cholesterol levels with intensive lifestyle modification including a low saturated fat diet, exercise and weight loss. She denies any chest pain, claudication or myalgias.  Depression screen Dawn [name]  Decreased Interest 1  Down, Depressed, Hopeless 0  PHQ - 2 Score 1  Altered sleeping 1  Tired, decreased energy 1  Change in appetite 1  Feeling bad or failure about yourself  0  Trouble concentrating 0  Moving slowly or fidgety/restless 0  Suicidal thoughts 0  PHQ-9 Score 4     ALLERGIES: Allergies  Allergen Reactions   Coconut Oil Anaphylaxis, Shortness Of Breath and  Swelling    Coconut   Latex Swelling    AND BLISTERS   Sulfa Antibiotics Anaphylaxis    MEDICATIONS: Current Outpatient Prescriptions on File Prior to Visit  Medication Sig Dispense Refill   albuterol (PROVENTIL HFA;VENTOLIN HFA) 108 (90 BASE) MCG/ACT inhaler Inhale 2 puffs into the lungs every 6 (six) hours as needed for wheezing.     b complex vitamins tablet Take 1 tablet by mouth daily.     Biotin 5000 MCG CAPS Take by mouth every morning.     buPROPion (WELLBUTRIN SR) 200 MG 12 hr tablet Take 1 tablet (200 mg total) by mouth daily. 30 tablet 0   docusate sodium (COLACE) 100 MG capsule Take 1 capsule (100 mg total) by mouth 2 (two) times daily. 60 capsule 0   EPINEPHrine 0.3 mg/0.3 mL IJ SOAJ injection Inject into the muscle once.     levocetirizine (XYZAL) 5 MG tablet Take 5 mg by mouth every evening.     Multiple Vitamins-Minerals (MULTIVITAMIN WITH MINERALS) tablet Take by mouth daily.      omega-3 acid ethyl esters (LOVAZA) 1 g capsule Take by mouth every morning.     Vitamin D, Ergocalciferol, (DRISDOL) 50000 units CAPS capsule Take 1 capsule (50,000 Units total) by mouth every 7 (seven) days. 4 capsule 0   No current facility-administered medications on file prior to visit.     PAST MEDICAL HISTORY: Past Medical History:  Diagnosis Date   Acute meniscal tear of knee LEFT   Arthritis  Asthma    Constipation    Contact lens/glasses fitting    wears contacts or glasses   Environmental allergies    Gallbladder problem    History of gastric ulcer AS TEEN   History of stomach ulcers    History of viral pericarditis PROBABLE OR IDIOPATHIC PER D/C SUMMARY MARCH 2011   NO PROBLEMS SINCE   Joint pain    Lactose intolerance    Multiple food allergies    coconut   Osteoarthritis    Pericarditis    Primary localized osteoarthritis of right knee 11/25/2011   S/P Left Knee arthroscopy performed by Dr. Charlann Boxerlin in April 2013.    Seasonal allergies      Torn rotator cuff     PAST SURGICAL HISTORY: Past Surgical History:  Procedure Laterality Date   APPENDECTOMY  1998   BILATERAL CARPAL TUNNEL RELEASE  1980'S   BREAST REDUCTION SURGERY Bilateral 12/21/2012   Procedure: BILATERAL MAMMARY REDUCTION  (BREAST);  Surgeon: Etter Sjogrenavid Bowers, MD;  Location: [facility];  Service: Plastics;  Laterality: Bilateral;   CHOLECYSTECTOMY  1992   KNEE ARTHROSCOPY  06/20/2011   Procedure: ARTHROSCOPY KNEE;  Surgeon: Shelda PalMatthew D Olin, MD;  [facility];  Service: Orthopedics;  Laterality: Left;  left knee scope   latex allergy patient blisters and swells   PARTIAL KNEE ARTHROPLASTY Right 03/03/2014   Procedure: RIGHT UNICOMPARTMENTAL KNEE ARTHROPLASTY;  Surgeon: Eulas PostJoshua P Landau, MD;  Location: [facility];  Service: Orthopedics;  Laterality: Right;   TRANSTHORACIC ECHOCARDIOGRAM  04-26-2009   NORMAL LVSF/ EF 60-65% / LVDF NORMAL / NO PERICARDIAL EFFUSION   VAGINAL HYSTERECTOMY  03-18-1999    SOCIAL HISTORY: Social History  Substance Use Topics   Smoking status: Never Smoker   Smokeless tobacco: Never Used   Alcohol use No    FAMILY HISTORY: Family History  Problem Relation Age of Onset   Cancer Brother    Asthma Son     ROS: Review of Systems  Constitutional: Positive for weight loss.  Cardiovascular: Negative for chest pain and claudication.  Gastrointestinal: Negative for nausea and vomiting.  Musculoskeletal: Negative for myalgias.       Negative muscle weakness  Psychiatric/Behavioral: Positive for depression. Negative for suicidal ideas.    PHYSICAL EXAM: Blood pressure 108/72, pulse 68, temperature 98 F (36.7 C), temperature source Oral, height 5\' 3"  (1.6 m), weight 161 lb (73 kg), SpO2 100 %. Body mass index is 28.52 kg/m. Physical Exam  Constitutional: She is oriented to person, place, and time. She appears well-developed and well-nourished.  Cardiovascular:  Normal rate.   Pulmonary/Chest: Effort normal.  Musculoskeletal: Normal range of motion.  Neurological: She is oriented to person, place, and time.  Skin: Skin is warm and dry.  Vitals reviewed.   RECENT LABS AND TESTS: BMET    Component Value Date/Time   NA 141 05/20/2016 0954   K 4.7 05/20/2016 0954   CL 100 05/20/2016 0954   CO2 27 05/20/2016 0954   GLUCOSE 77 05/20/2016 0954   GLUCOSE 89 03/09/2012 1915   BUN 20 05/20/2016 0954   CREATININE 0.82 05/20/2016 0954   CALCIUM 9.8 05/20/2016 0954   GFRNONAA 83 05/20/2016 0954   GFRAA 95 05/20/2016 0954   Lab Results  Component Value Date   HGBA1C 4.9 05/20/2016   Lab Results  Component Value Date   INSULIN 5.7 05/20/2016   CBC    Component Value Date/Time   WBC 10.3 05/20/2016 0954  WBC 11.5 (H) 03/09/2012 1915   RBC 4.22 05/20/2016 0954   RBC 4.32 03/09/2012 1915   HGB 12.9 05/20/2016 0954   HCT 40.1 05/20/2016 0954   PLT 343 05/20/2016 0954   MCV 95 05/20/2016 0954   MCH 30.6 05/20/2016 0954   MCH 31.3 03/09/2012 1915   MCHC 32.2 05/20/2016 0954   MCHC 34.0 03/09/2012 1915   RDW 13.2 05/20/2016 0954   LYMPHSABS 2.6 05/20/2016 0954   MONOABS 0.6 03/09/2012 1915   EOSABS 0.1 05/20/2016 0954   BASOSABS 0.0 05/20/2016 0954   Iron/TIBC/Ferritin/ %Sat No results found for: IRON, TIBC, FERRITIN, IRONPCTSAT Lipid Panel     Component Value Date/Time   CHOL 178 05/20/2016 0954   TRIG 61 05/20/2016 0954   HDL 55 05/20/2016 0954   LDLCALC 111 (H) 05/20/2016 0954   Hepatic Function Panel     Component Value Date/Time   PROT 7.9 05/20/2016 0954   ALBUMIN 4.6 05/20/2016 0954   AST 18 05/20/2016 0954   ALT 19 05/20/2016 0954   ALKPHOS 64 05/20/2016 0954   BILITOT 0.3 05/20/2016 0954      Component Value Date/Time   TSH 2.040 05/20/2016 0954    ASSESSMENT AND PLAN: Vitamin D deficiency - Plan: VITAMIN D 25 Hydroxy (Vit-D Deficiency, Fractures)  Other depression  Hyperlipidemia, unspecified  hyperlipidemia type - Plan: Comprehensive metabolic panel, Lipid Panel With LDL/HDL Ratio  Class 1 obesity without serious comorbidity with body mass index (BMI) of 30.0 to 30.9 in adult, unspecified obesity type - patient BMI >30 when she began program  PLAN:  Vitamin D Deficiency Dawn [name] was informed that low vitamin D levels contributes to fatigue and are associated with obesity, breast, and colon cancer. She agrees to continue to take prescription Vit D @50 ,000 IU every week and we will check labs today and will follow up for routine testing of vitamin D, at least 2-3 times per year. She was informed of the risk of over-replacement of vitamin D and agrees to not increase her dose unless he discusses this with [place] first. Dawn [name] agrees to follow up with our clinic in 2 weeks.  Depression with Emotional Eating Behaviors We discussed behavior modification techniques today to help Dawn [name] deal with her emotional eating and depression. She has agreed to take Wellbutrin SR 200 mg qd and agreed to follow up as directed.  Hyperlipidemia Dawn [name] was informed of the American Heart Association Guidelines emphasizing intensive lifestyle modifications as the first line treatment for hyperlipidemia. We discussed many lifestyle modifications today in depth, and Dawn [name] will continue to work on decreasing saturated fats such as fatty red meat, butter and many fried foods. She will also increase vegetables and lean protein in her diet and continue to work on exercise and weight loss efforts. We will check labs today and Dawn [name] agrees to follow up with our clinic in 2 weeks.  Obesity Dawn [name] is currently in the action stage of change. As such, her goal is to continue with weight loss efforts She has agreed to keep a food journal with 350 to 450 calories and 35+ grams of protein at supper daily and follow the Category 1 plan Dawn [name] has been instructed to work up to a goal of 150 minutes of  combined cardio and strengthening exercise per week for weight loss and overall health benefits. We discussed the following Behavioral Modification Strategies today: meal planning & cooking strategies and keep a strict food journal  Dawn [name] has agreed to follow up with our clinic  in 2 weeks. She was informed of the importance of frequent follow up visits to maximize her success with intensive lifestyle modifications for her multiple health conditions.  Dawn [name], am acting as transcriptionist for Illa Level, [place]  I have reviewed the above documentation for accuracy and completeness, and I agree with the above. -Quillian Quince, MD  OBESITY BEHAVIORAL INTERVENTION VISIT  Today's visit was # 8 out of 22.  Starting weight: 173 lbs Starting date: 05/20/16 Today's weight : 161 lbs  Today's date: 09/01/2016 Total lbs lost to date: 12 (Patients must lose 7 lbs in the first 6 months to continue with counseling)   ASK: We discussed the diagnosis of obesity with Dawn [name] today and Dawn [name] agreed to give [place] permission to discuss obesity behavioral modification therapy today.  ASSESS: Dawn [name] has the diagnosis of obesity and her BMI today is 28.6 Dawn [name] is in the action stage of change   ADVISE: Dawn [name] was educated on the multiple health risks of obesity as well as the benefit of weight loss to improve her health. She was advised of the need for long term treatment and the importance of lifestyle modifications.  AGREE: Multiple dietary modification options and treatment options were discussed and  Dawn [name] agreed to keep a food journal with 350 to 450 calories and 35+ grams of protein at supper daily and follow the Category 1 plan We discussed the following Behavioral Modification Strategies today: meal planning & cooking strategies and keep a strict food journal

## 2016-09-02 LAB — COMPREHENSIVE METABOLIC PANEL WITH GFR
ALT: 15 [IU]/L (ref 0–32)
AST: 19 [IU]/L (ref 0–40)
Albumin/Globulin Ratio: 1.4 (ref 1.2–2.2)
Albumin: 4 g/dL (ref 3.5–5.5)
Alkaline Phosphatase: 61 [IU]/L (ref 39–117)
BUN/Creatinine Ratio: 25 — ABNORMAL HIGH (ref 9–23)
BUN: 16 mg/dL (ref 6–24)
Bilirubin Total: 0.3 mg/dL (ref 0.0–1.2)
CO2: 24 mmol/L (ref 20–29)
Calcium: 9.2 mg/dL (ref 8.7–10.2)
Chloride: 101 mmol/L (ref 96–106)
Creatinine, Ser: 0.64 mg/dL (ref 0.57–1.00)
GFR calc Af Amer: 119 mL/min/{1.73_m2}
GFR calc non Af Amer: 103 mL/min/{1.73_m2}
Globulin, Total: 2.9 g/dL (ref 1.5–4.5)
Glucose: 71 mg/dL (ref 65–99)
Potassium: 4.6 mmol/L (ref 3.5–5.2)
Sodium: 138 mmol/L (ref 134–144)
Total Protein: 6.9 g/dL (ref 6.0–8.5)

## 2016-09-02 LAB — LIPID PANEL WITH LDL/HDL RATIO
Cholesterol, Total: 154 mg/dL (ref 100–199)
HDL: 54 mg/dL
LDL Calculated: 91 mg/dL (ref 0–99)
LDl/HDL Ratio: 1.7 ratio (ref 0.0–3.2)
Triglycerides: 46 mg/dL (ref 0–149)
VLDL Cholesterol Cal: 9 mg/dL (ref 5–40)

## 2016-09-02 LAB — VITAMIN D 25 HYDROXY (VIT D DEFICIENCY, FRACTURES): VIT D 25 HYDROXY: 46.5 ng/mL (ref 30.0–100.0)

## 2016-09-15 ENCOUNTER — Ambulatory Visit (INDEPENDENT_AMBULATORY_CARE_PROVIDER_SITE_OTHER): Payer: 59 | Admitting: Physician Assistant

## 2016-09-15 VITALS — BP 121/79 | HR 73 | Temp 98.2°F | Ht 63.0 in | Wt 168.0 lb

## 2016-09-15 DIAGNOSIS — E784 Other hyperlipidemia: Secondary | ICD-10-CM

## 2016-09-15 DIAGNOSIS — Z6830 Body mass index (BMI) 30.0-30.9, adult: Secondary | ICD-10-CM | POA: Diagnosis not present

## 2016-09-15 DIAGNOSIS — E669 Obesity, unspecified: Secondary | ICD-10-CM

## 2016-09-15 DIAGNOSIS — E7849 Other hyperlipidemia: Secondary | ICD-10-CM

## 2016-09-16 NOTE — Progress Notes (Signed)
Office: [phone]  /  Fax: [phone]   HPI:   Chief Complaint: OBESITY Dawn [name] is here to discuss her progress with her obesity treatment plan. She is on the  follow the Category 1 plan and is following her eating plan approximately 50 % of the time. She states she is walking for exercise 30 minutes 2 times per week. Dawn [name] has been sick with a URI and had a hard time following the plan and eating the protein. Noticed increase in cravings.  Her weight is 168 lb (76.2 kg) today and has gained 7 pounds since her last visit. She has lost 5 lbs since starting treatment with [place].  Hyperlipidemia Dawn [name] has hyperlipidemia and has been trying to improve her cholesterol levels with intensive lifestyle modification including a low saturated fat diet, exercise and weight loss. Repeat LDL is at goal <100.  She denies any chest pain, claudication or myalgias.    ALLERGIES: Allergies  Allergen Reactions  . Coconut Oil Anaphylaxis, Shortness Of Breath and Swelling    Coconut  . Latex Swelling    AND BLISTERS  . Sulfa Antibiotics Anaphylaxis    MEDICATIONS: Current Outpatient Prescriptions on File Prior to Visit  Medication Sig Dispense Refill  . albuterol (PROVENTIL HFA;VENTOLIN HFA) 108 (90 BASE) MCG/ACT inhaler Inhale 2 puffs into the lungs every 6 (six) hours as needed for wheezing.    Marland Kitchen b complex vitamins tablet Take 1 tablet by mouth daily.    . Biotin 5000 MCG CAPS Take by mouth every morning.    Marland Kitchen buPROPion (WELLBUTRIN SR) 200 MG 12 hr tablet Take 1 tablet (200 mg total) by mouth daily. 30 tablet 0  . docusate sodium (COLACE) 100 MG capsule Take 1 capsule (100 mg total) by mouth 2 (two) times daily. 60 capsule 0  . EPINEPHrine 0.3 mg/0.3 mL IJ SOAJ injection Inject into the muscle once.    Marland Kitchen levocetirizine (XYZAL) 5 MG tablet Take 5 mg by mouth every evening.    . Multiple Vitamins-Minerals (MULTIVITAMIN WITH MINERALS) tablet Take by mouth daily.     Marland Kitchen omega-3 acid ethyl  esters (LOVAZA) 1 g capsule Take by mouth every morning.    . Vitamin D, Ergocalciferol, (DRISDOL) 50000 units CAPS capsule Take 1 capsule (50,000 Units total) by mouth every 7 (seven) days. 4 capsule 0   No current facility-administered medications on file prior to visit.     PAST MEDICAL HISTORY: Past Medical History:  Diagnosis Date  . Acute meniscal tear of knee LEFT  . Arthritis   . Asthma   . Constipation   . Contact lens/glasses fitting    wears contacts or glasses  . Environmental allergies   . Gallbladder problem   . History of gastric ulcer AS TEEN  . History of stomach ulcers   . History of viral pericarditis PROBABLE OR IDIOPATHIC PER D/C SUMMARY MARCH 2011   NO PROBLEMS SINCE  . Joint pain   . Lactose intolerance   . Multiple food allergies    coconut  . Osteoarthritis   . Pericarditis   . Primary localized osteoarthritis of right knee 11/25/2011   S/P Left Knee arthroscopy performed by Dr. Charlann Boxer in April 2013.   . Seasonal allergies   . Torn rotator cuff     PAST SURGICAL HISTORY: Past Surgical History:  Procedure Laterality Date  . APPENDECTOMY  1998  . BILATERAL CARPAL TUNNEL RELEASE  1980'S  . BREAST REDUCTION SURGERY Bilateral 12/21/2012   Procedure: BILATERAL MAMMARY REDUCTION  (  BREAST);  Surgeon: Etter Sjogrenavid Bowers, MD;  Location: [facility];  Service: Plastics;  Laterality: Bilateral;  . CHOLECYSTECTOMY  1992  . KNEE ARTHROSCOPY  06/20/2011   Procedure: ARTHROSCOPY KNEE;  Surgeon: Shelda PalMatthew D Olin, MD;  [facility];  Service: Orthopedics;  Laterality: Left;  left knee scope   latex allergy patient blisters and swells  . PARTIAL KNEE ARTHROPLASTY Right 03/03/2014   Procedure: RIGHT UNICOMPARTMENTAL KNEE ARTHROPLASTY;  Surgeon: Eulas PostJoshua P Landau, MD;  Location: [facility];  Service: Orthopedics;  Laterality: Right;  . TRANSTHORACIC ECHOCARDIOGRAM  04-26-2009   NORMAL LVSF/ EF 60-65% / LVDF NORMAL / NO  PERICARDIAL EFFUSION  . VAGINAL HYSTERECTOMY  03-18-1999    SOCIAL HISTORY: Social History  Substance Use Topics  . Smoking status: Never Smoker  . Smokeless tobacco: Never Used  . Alcohol use No    FAMILY HISTORY: Family History  Problem Relation Age of Onset  . Cancer Brother   . Asthma Son     ROS: Review of Systems  Cardiovascular: Negative for chest pain and claudication.  Musculoskeletal: Negative for myalgias.    PHYSICAL EXAM: Blood pressure 121/79, pulse 73, temperature 98.2 F (36.8 C), temperature source Oral, height 5\' 3"  (1.6 m), weight 168 lb (76.2 kg), SpO2 100 %. Body mass index is 29.76 kg/m. Physical Exam  Constitutional: She is oriented to person, place, and time. She appears well-developed and well-nourished.  Cardiovascular: Normal rate.   Pulmonary/Chest: Effort normal.  Musculoskeletal: Normal range of motion.  Neurological: She is alert and oriented to person, place, and time.  Skin: Skin is warm and dry.  Psychiatric: She has a normal mood and affect.    RECENT LABS AND TESTS: BMET    Component Value Date/Time   NA 138 09/01/2016 1004   K 4.6 09/01/2016 1004   CL 101 09/01/2016 1004   CO2 24 09/01/2016 1004   GLUCOSE 71 09/01/2016 1004   GLUCOSE 89 03/09/2012 1915   BUN 16 09/01/2016 1004   CREATININE 0.64 09/01/2016 1004   CALCIUM 9.2 09/01/2016 1004   GFRNONAA 103 09/01/2016 1004   GFRAA 119 09/01/2016 1004   Lab Results  Component Value Date   HGBA1C 4.9 05/20/2016   Lab Results  Component Value Date   INSULIN 5.7 05/20/2016   CBC    Component Value Date/Time   WBC 10.3 05/20/2016 0954   WBC 11.5 (H) 03/09/2012 1915   RBC 4.22 05/20/2016 0954   RBC 4.32 03/09/2012 1915   HGB 12.9 05/20/2016 0954   HCT 40.1 05/20/2016 0954   PLT 343 05/20/2016 0954   MCV 95 05/20/2016 0954   MCH 30.6 05/20/2016 0954   MCH 31.3 03/09/2012 1915   MCHC 32.2 05/20/2016 0954   MCHC 34.0 03/09/2012 1915   RDW 13.2 05/20/2016 0954    LYMPHSABS 2.6 05/20/2016 0954   MONOABS 0.6 03/09/2012 1915   EOSABS 0.1 05/20/2016 0954   BASOSABS 0.0 05/20/2016 0954   Iron/TIBC/Ferritin/ %Sat No results found for: IRON, TIBC, FERRITIN, IRONPCTSAT Lipid Panel     Component Value Date/Time   CHOL 154 09/01/2016 1004   TRIG 46 09/01/2016 1004   HDL 54 09/01/2016 1004   LDLCALC 91 09/01/2016 1004   Hepatic Function Panel     Component Value Date/Time   PROT 6.9 09/01/2016 1004   ALBUMIN 4.0 09/01/2016 1004   AST 19 09/01/2016 1004   ALT 15 09/01/2016 1004   ALKPHOS 61 09/01/2016 1004   BILITOT 0.3  09/01/2016 1004      Component Value Date/Time   TSH 2.040 05/20/2016 0954    ASSESSMENT AND PLAN: Other hyperlipidemia  Class 1 obesity with serious comorbidity and body mass index (BMI) of 30.0 to 30.9 in adult, unspecified obesity type - Starting BMI greater then 30  PLAN:  Hyperlipidemia Dawn [name] was informed of the American Heart Association Guidelines emphasizing intensive lifestyle modifications as the first line treatment for hyperlipidemia. We discussed many lifestyle modifications today in depth, and Dawn [name] will continue to work on decreasing saturated fats such as fatty red meat, butter and many fried foods. She will also increase vegetables and lean protein in her diet and continue to work on exercise and weight loss efforts.  Obesity Dawn [name] is currently in the action stage of change. As such, her goal is to continue with weight loss efforts She has agreed to follow a lower carbohydrate, vegetable and lean protein rich diet plan Dawn [name] has been instructed to work up to a goal of 150 minutes of combined cardio and strengthening exercise per week for weight loss and overall health benefits. We discussed the following Behavioral Modification Stratagies today: increasing lean protein intake and increase water intake.   Dawn [name] has agreed to follow up with our clinic in 2 weeks. She was informed of the  importance of frequent follow up visits to maximize her success with intensive lifestyle modifications for her multiple health conditions.   We spent > than 50% of the 15 minute visit on the counseling as documented in the note.    Office: [phone]  /  Fax: [phone]  OBESITY BEHAVIORAL INTERVENTION VISIT  Today's visit was # 9 out of 22.  Starting weight: 173 Starting date: 05/20/16 Today's weight : Weight: 168 lb (76.2 kg)  Today's date: 09/16/2016 Total lbs lost to date: 5 (Patients must lose 7 lbs in the first 6 months to continue with counseling)   ASK: We discussed the diagnosis of obesity with Dawn [name] today and Dawn [name] agreed to give [place] permission to discuss obesity behavioral modification therapy today.  ASSESS: Dawn [name] has the diagnosis of obesity and her BMI today is 29.8 Dawn [name] is in the action stage of change   ADVISE: Dawn [name] was educated on the multiple health risks of obesity as well as the benefit of weight loss to improve her health. She was advised of the need for long term treatment and the importance of lifestyle modifications.  AGREE: Multiple dietary modification options and treatment options were discussed and  Dawn [name] agreed to follow a lower carbohydrate, vegetable and lean protein rich diet plan We discussed the following Behavioral Modification Stratagies today: increasing lean protein intake and increase water intake.     I have reviewed the above documentation for accuracy and completeness, and I agree with the above. -Dawn Level, PA-C  I have reviewed the above note and agree with the plan. -Quillian Quince, MD

## 2016-09-22 ENCOUNTER — Other Ambulatory Visit (INDEPENDENT_AMBULATORY_CARE_PROVIDER_SITE_OTHER): Payer: Self-pay | Admitting: Family Medicine

## 2016-09-29 ENCOUNTER — Ambulatory Visit (INDEPENDENT_AMBULATORY_CARE_PROVIDER_SITE_OTHER): Payer: 59 | Admitting: Physician Assistant

## 2016-09-29 VITALS — BP 121/75 | HR 60 | Temp 98.1°F | Ht 63.0 in | Wt 159.0 lb

## 2016-09-29 DIAGNOSIS — E669 Obesity, unspecified: Secondary | ICD-10-CM | POA: Diagnosis not present

## 2016-09-29 DIAGNOSIS — E559 Vitamin D deficiency, unspecified: Secondary | ICD-10-CM | POA: Diagnosis not present

## 2016-09-29 DIAGNOSIS — F329 Major depressive disorder, single episode, unspecified: Secondary | ICD-10-CM

## 2016-09-29 DIAGNOSIS — Z6830 Body mass index (BMI) 30.0-30.9, adult: Secondary | ICD-10-CM | POA: Insufficient documentation

## 2016-09-29 DIAGNOSIS — F32A Depression, unspecified: Secondary | ICD-10-CM

## 2016-09-29 DIAGNOSIS — Z9189 Other specified personal risk factors, not elsewhere classified: Secondary | ICD-10-CM | POA: Diagnosis not present

## 2016-09-29 MED ORDER — VITAMIN D (ERGOCALCIFEROL) 1.25 MG (50000 UNIT) PO CAPS: 50000.0000 [IU] | ORAL_CAPSULE | ORAL | 0 refills | Status: DC

## 2016-09-29 MED ORDER — BUPROPION HCL ER (SR) 200 MG PO TB12: 200.0000 mg | ORAL_TABLET | Freq: Every day | ORAL | 0 refills | Status: DC

## 2016-09-29 MED FILL — BUPROPION HCL SR 200 MG TAB: 200 | 30 days supply | Qty: 30 | Fill #0

## 2016-09-29 MED FILL — VIT D2 1.25 MG (50,000 UNIT: 1.25 MG | 28 days supply | Qty: 4 | Fill #0

## 2016-09-29 NOTE — Progress Notes (Signed)
Office: [phone]  /  Fax: [phone]   HPI:   Chief Complaint: OBESITY Dawn [name] is here to discuss her progress with her obesity treatment plan. She is on the lower carbohydrate, vegetable and lean protein rich diet plan and is following her eating plan approximately 90 % of the time. She states she is walking 3 times per week. Dawn [name] continues to do well with the lower carbohydrate plan. She will go back to keeping a food journal. Her weight is 159 lb (72.1 kg) today and has had a weight loss of 9 pounds over a period of 2 weeks since her last visit. She has lost 14 lbs since starting treatment with [place].  Vitamin D deficiency Dawn [name] has a diagnosis of vitamin D deficiency. She is currently taking vit D and denies nausea, vomiting or muscle weakness.  At risk for osteopenia Dawn [name] is at higher risk of osteopenia and osteoporosis due to vitamin D deficiency.   Depression with emotional eating behaviors Dawn [name] is struggling with emotional eating and using food for comfort to the extent that it is negatively impacting her health. She often snacks when she is not hungry. Dawn [name] sometimes feels she is out of control and then feels guilty that she made poor food choices. She has been working on behavior modification techniques to help reduce her emotional eating and has been somewhat successful. She shows no sign of suicidal or homicidal ideations.  Depression screen Dawn [name] 2/9 05/20/2016  Decreased Interest 1  Down, Depressed, Hopeless 0  PHQ - 2 Score 1  Altered sleeping 1  Tired, decreased energy 1  Change in appetite 1  Feeling bad or failure about yourself  0  Trouble concentrating 0  Moving slowly or fidgety/restless 0  Suicidal thoughts 0  PHQ-9 Score 4     ALLERGIES: Allergies  Allergen Reactions  . Coconut Oil Anaphylaxis, Shortness Of Breath and Swelling    Coconut  . Latex Swelling    AND BLISTERS  . Sulfa Antibiotics Anaphylaxis     MEDICATIONS: Current Outpatient Prescriptions on File Prior to Visit  Medication Sig Dispense Refill  . albuterol (PROVENTIL HFA;VENTOLIN HFA) 108 (90 BASE) MCG/ACT inhaler Inhale 2 puffs into the lungs every 6 (six) hours as needed for wheezing.    Marland Kitchen b complex vitamins tablet Take 1 tablet by mouth daily.    . Biotin 5000 MCG CAPS Take by mouth every morning.    . docusate sodium (COLACE) 100 MG capsule Take 1 capsule (100 mg total) by mouth 2 (two) times daily. 60 capsule 0  . EPINEPHrine 0.3 mg/0.3 mL IJ SOAJ injection Inject into the muscle once.    Marland Kitchen levocetirizine (XYZAL) 5 MG tablet Take 5 mg by mouth every evening.    . Multiple Vitamins-Minerals (MULTIVITAMIN WITH MINERALS) tablet Take by mouth daily.     Marland Kitchen omega-3 acid ethyl esters (LOVAZA) 1 g capsule Take by mouth every morning.     No current facility-administered medications on file prior to visit.     PAST MEDICAL HISTORY: Past Medical History:  Diagnosis Date  . Acute meniscal tear of knee LEFT  . Arthritis   . Asthma   . Constipation   . Contact lens/glasses fitting    wears contacts or glasses  . Environmental allergies   . Gallbladder problem   . History of gastric ulcer AS TEEN  . History of stomach ulcers   . History of viral pericarditis PROBABLE OR IDIOPATHIC PER D/C SUMMARY MARCH 2011   NO  PROBLEMS SINCE  . Joint pain   . Lactose intolerance   . Multiple food allergies    coconut  . Osteoarthritis   . Pericarditis   . Primary localized osteoarthritis of right knee 11/25/2011   S/P Left Knee arthroscopy performed by Dr. Charlann Boxer in April 2013.   . Seasonal allergies   . Torn rotator cuff     PAST SURGICAL HISTORY: Past Surgical History:  Procedure Laterality Date  . APPENDECTOMY  1998  . BILATERAL CARPAL TUNNEL RELEASE  1980'S  . BREAST REDUCTION SURGERY Bilateral 12/21/2012   Procedure: BILATERAL MAMMARY REDUCTION  (BREAST);  Surgeon: Etter Sjogren, MD;  Location: [facility];   Service: Plastics;  Laterality: Bilateral;  . CHOLECYSTECTOMY  1992  . KNEE ARTHROSCOPY  06/20/2011   Procedure: ARTHROSCOPY KNEE;  Surgeon: Shelda Pal, MD;  Location: [facility];  Service: Orthopedics;  Laterality: Left;  left knee scope   latex allergy patient blisters and swells  . PARTIAL KNEE ARTHROPLASTY Right 03/03/2014   Procedure: RIGHT UNICOMPARTMENTAL KNEE ARTHROPLASTY;  Surgeon: Eulas Post, MD;  Location: [facility];  Service: Orthopedics;  Laterality: Right;  . TRANSTHORACIC ECHOCARDIOGRAM  04-26-2009   NORMAL LVSF/ EF 60-65% / LVDF NORMAL / NO PERICARDIAL EFFUSION  . VAGINAL HYSTERECTOMY  03-18-1999    SOCIAL HISTORY: Social History  Substance Use Topics  . Smoking status: Never Smoker  . Smokeless tobacco: Never Used  . Alcohol use No    FAMILY HISTORY: Family History  Problem Relation Age of Onset  . Cancer Brother   . Asthma Son     ROS: Review of Systems  Constitutional: Positive for weight loss.  Gastrointestinal: Negative for nausea and vomiting.  Musculoskeletal:       Negative muscle weakness  Psychiatric/Behavioral: Positive for depression. Negative for suicidal ideas.    PHYSICAL EXAM: Blood pressure 121/75, pulse 60, temperature 98.1 F (36.7 C), temperature source Oral, height 5\' 3"  (1.6 m), weight 159 lb (72.1 kg), SpO2 100 %. Body mass index is 28.17 kg/m. Physical Exam  Constitutional: She is oriented to person, place, and time. She appears well-developed and well-nourished.  Cardiovascular: Normal rate.   Pulmonary/Chest: Effort normal.  Musculoskeletal: Normal range of motion.  Neurological: She is oriented to person, place, and time.  Skin: Skin is warm and dry.  Psychiatric: She has a normal mood and affect. Her behavior is normal.  Vitals reviewed.   RECENT LABS AND TESTS: BMET    Component Value Date/Time   NA 138 09/01/2016 1004   K 4.6 09/01/2016 1004   CL 101 09/01/2016 1004   CO2 24  09/01/2016 1004   GLUCOSE 71 09/01/2016 1004   GLUCOSE 89 03/09/2012 1915   BUN 16 09/01/2016 1004   CREATININE 0.64 09/01/2016 1004   CALCIUM 9.2 09/01/2016 1004   GFRNONAA 103 09/01/2016 1004   GFRAA 119 09/01/2016 1004   Lab Results  Component Value Date   HGBA1C 4.9 05/20/2016   Lab Results  Component Value Date   INSULIN 5.7 05/20/2016   CBC    Component Value Date/Time   WBC 10.3 05/20/2016 0954   WBC 11.5 (H) 03/09/2012 1915   RBC 4.22 05/20/2016 0954   RBC 4.32 03/09/2012 1915   HGB 12.9 05/20/2016 0954   HCT 40.1 05/20/2016 0954   PLT 343 05/20/2016 0954   MCV 95 05/20/2016 0954   MCH 30.6 05/20/2016 0954   MCH 31.3 03/09/2012 1915   MCHC 32.2 05/20/2016 0954  MCHC 34.0 03/09/2012 1915   RDW 13.2 05/20/2016 0954   LYMPHSABS 2.6 05/20/2016 0954   MONOABS 0.6 03/09/2012 1915   EOSABS 0.1 05/20/2016 0954   BASOSABS 0.0 05/20/2016 0954   Iron/TIBC/Ferritin/ %Sat No results found for: IRON, TIBC, FERRITIN, IRONPCTSAT Lipid Panel     Component Value Date/Time   CHOL 154 09/01/2016 1004   TRIG 46 09/01/2016 1004   HDL 54 09/01/2016 1004   LDLCALC 91 09/01/2016 1004   Hepatic Function Panel     Component Value Date/Time   PROT 6.9 09/01/2016 1004   ALBUMIN 4.0 09/01/2016 1004   AST 19 09/01/2016 1004   ALT 15 09/01/2016 1004   ALKPHOS 61 09/01/2016 1004   BILITOT 0.3 09/01/2016 1004      Component Value Date/Time   TSH 2.040 05/20/2016 0954    ASSESSMENT AND PLAN: Vitamin D deficiency - Plan: Vitamin D, Ergocalciferol, (DRISDOL) 50000 units CAPS capsule  Depression, unspecified depression type - Plan: buPROPion (WELLBUTRIN SR) 200 MG 12 hr tablet  At risk for osteoporosis  Class 1 obesity without serious comorbidity with body mass index (BMI) of 30.0 to 30.9 in adult, unspecified obesity type - Patient BMI >30 when she started the program  PLAN:  Vitamin D Deficiency Dawn [name] was informed that low vitamin D levels contributes to fatigue  and are associated with obesity, breast, and colon cancer. She agrees to continue to take prescription Vit D @50 ,000 IU every week, we will refill for 1 month and will follow up for routine testing of vitamin D, at least 2-3 times per year. She was informed of the risk of over-replacement of vitamin D and agrees to not increase her dose unless he discusses this with us first. Dawn [name] agrees to follow up with our clinic in 2 weeks.  Depression with Emotional Eating Behaviors We discussed behavior modification techniques today to help Dawn [name] deal with her emotional eating and depression. She has agreed to continue to take Wellbutrin SR 200 mg qd 330 with no refills and will follow up as directed.  At risk for osteopenia Dawn [name] is at risk for osteopenia and osteoporsis due to her vitamin D deficiency. She was encouraged to take her vitamin D and follow her higher calcium diet and increase strengthening exercise to help strengthen her bones and decrease her risk of osteopenia and osteoporosis.  Obesity Dawn [name] is currently in the action stage of change. As such, her goal is to continue with weight loss efforts She has agreed to keep a food journal with 350 to 450 calories and 35+ grams of protein  and follow the Category 1 plan Dawn [name] has been instructed to work up to a goal of 150 minutes of combined cardio and strengthening exercise per week for weight loss and overall health benefits. We discussed the following Behavioral Modification Strategies today: increasing lean protein intake and keeping healthy foods in the home  Dawn [name] has agreed to follow up with our clinic in 2 weeks. She was informed of the importance of frequent follow up visits to maximize her success with intensive lifestyle modifications for her multiple health conditions.  I, [place] CraneJoanne Murray, am acting as transcriptionist for Illa LevelSahar Osman, PA-C  I have reviewed the above documentation for accuracy and completeness, and  I agree with the above. -Illa LevelSahar Osman, PA-C  I have reviewed the above note and agree with the plan. -Quillian Quincearen Sherlene Rickel, MD  OBESITY BEHAVIORAL INTERVENTION VISIT  Today's visit was # 10 out of 22.  Starting weight: 173 lbs  Starting date: 05/20/16 Today's weight : 159 lbs  Today's date: 09/29/2016 Total lbs lost to date: 14 (Patients must lose 7 lbs in the first 6 months to continue with counseling)   ASK: We discussed the diagnosis of obesity with [name] today and Kolbie agreed to give [place] permission to discuss obesity behavioral modification therapy today.  ASSESS: Darnetta has the diagnosis of obesity and her BMI today is 28.2 Belisa is in the action stage of change   ADVISE: Tamlyn was educated on the multiple health risks of obesity as well as the benefit of weight loss to improve her health. She was advised of the need for long term treatment and the importance of lifestyle modifications.  AGREE: Multiple dietary modification options and treatment options were discussed and  Norissa agreed to keep a food journal with 350 to 450 calories and 35+ grams of protein at supper daily and follow the Category 1 plan We discussed the following Behavioral Modification Strategies today: increasing lean protein intake and keeping healthy foods in the home

## 2016-10-13 ENCOUNTER — Ambulatory Visit (INDEPENDENT_AMBULATORY_CARE_PROVIDER_SITE_OTHER): Payer: 59 | Admitting: Physician Assistant

## 2016-10-13 VITALS — BP 125/73 | HR 67 | Temp 97.9°F | Ht 63.0 in | Wt 160.0 lb

## 2016-10-13 DIAGNOSIS — E559 Vitamin D deficiency, unspecified: Secondary | ICD-10-CM | POA: Diagnosis not present

## 2016-10-13 DIAGNOSIS — Z9189 Other specified personal risk factors, not elsewhere classified: Secondary | ICD-10-CM | POA: Diagnosis not present

## 2016-10-13 DIAGNOSIS — Z6830 Body mass index (BMI) 30.0-30.9, adult: Secondary | ICD-10-CM

## 2016-10-13 DIAGNOSIS — F3289 Other specified depressive episodes: Secondary | ICD-10-CM

## 2016-10-13 DIAGNOSIS — E669 Obesity, unspecified: Secondary | ICD-10-CM | POA: Diagnosis not present

## 2016-10-13 MED ORDER — VITAMIN D (ERGOCALCIFEROL) 1.25 MG (50000 UNIT) PO CAPS: 50000.0000 [IU] | ORAL_CAPSULE | ORAL | 0 refills | Status: DC

## 2016-10-13 MED ORDER — BUPROPION HCL ER (SR) 200 MG PO TB12: 200.0000 mg | ORAL_TABLET | Freq: Every day | ORAL | 0 refills | Status: DC

## 2016-10-13 NOTE — Progress Notes (Addendum)
Office: [phone]  /  Fax: [phone]   HPI:   Chief Complaint: OBESITY Dawn [name] is here to discuss her progress with her obesity treatment plan. She is on the category 1 plan + 450 to 500 grams of protein at dinner daily and is following her eating plan approximately 85 % of the time. She states she is walking 30 minutes 3 times per week. [name] was on vacation and had a hard time following the plan. She is ready to get back on track and would like to go back on the low carbohydrate plan. Her weight is 160 lb (72.6 kg) today and has had a weight gain of 1 pound over a period of 2 weeks since her last visit. She has lost 13 lbs since starting treatment with [place].  Vitamin D deficiency Dawn [name] has a diagnosis of vitamin D deficiency. She is currently taking vit D and denies nausea, vomiting or muscle weakness.  At risk for osteopenia Dawn [name] is at higher risk of osteopenia and osteoporosis due to vitamin D deficiency.   Depression with emotional eating behaviors Dawn [name] is struggling with emotional eating and using food for comfort to the extent that it is negatively impacting her health. She often snacks when she is not hungry. Dawn [name] sometimes feels she is out of control and then feels guilty that she made poor food choices. She has been working on behavior modification techniques to help reduce her emotional eating and has been somewhat successful. Her mood is stable and she shows no sign of suicidal or homicidal ideations.  Depression screen [facility] 2/9 05/20/2016  Decreased Interest 1  Down, Depressed, Hopeless 0  PHQ - 2 Score 1  Altered sleeping 1  Tired, decreased energy 1  Change in appetite 1  Feeling bad or failure about yourself  0  Trouble concentrating 0  Moving slowly or fidgety/restless 0  Suicidal thoughts 0  PHQ-9 Score 4       ALLERGIES: Allergies  Allergen Reactions  . Coconut Oil Anaphylaxis, Shortness Of Breath and Swelling    Coconut  .  Latex Swelling    AND BLISTERS  . Sulfa Antibiotics Anaphylaxis    MEDICATIONS: Current Outpatient Prescriptions on File Prior to Visit  Medication Sig Dispense Refill  . albuterol (PROVENTIL HFA;VENTOLIN HFA) 108 (90 BASE) MCG/ACT inhaler Inhale 2 puffs into the lungs every 6 (six) hours as needed for wheezing.    Marland Kitchen b complex vitamins tablet Take 1 tablet by mouth daily.    . Biotin 5000 MCG CAPS Take by mouth every morning.    Marland Kitchen buPROPion (WELLBUTRIN SR) 200 MG 12 hr tablet Take 1 tablet (200 mg total) by mouth daily. 30 tablet 0  . docusate sodium (COLACE) 100 MG capsule Take 1 capsule (100 mg total) by mouth 2 (two) times daily. 60 capsule 0  . EPINEPHrine 0.3 mg/0.3 mL IJ SOAJ injection Inject into the muscle once.    Marland Kitchen levocetirizine (XYZAL) 5 MG tablet Take 5 mg by mouth every evening.    . Multiple Vitamins-Minerals (MULTIVITAMIN WITH MINERALS) tablet Take by mouth daily.     Marland Kitchen omega-3 acid ethyl esters (LOVAZA) 1 g capsule Take by mouth every morning.    . Vitamin D, Ergocalciferol, (DRISDOL) 50000 units CAPS capsule Take 1 capsule (50,000 Units total) by mouth every 7 (seven) days. 4 capsule 0   No current facility-administered medications on file prior to visit.     PAST MEDICAL HISTORY: Past Medical History:  Diagnosis Date  .  Acute meniscal tear of knee LEFT  . Arthritis   . Asthma   . Constipation   . Contact lens/glasses fitting    wears contacts or glasses  . Environmental allergies   . Gallbladder problem   . History of gastric ulcer AS TEEN  . History of stomach ulcers   . History of viral pericarditis PROBABLE OR IDIOPATHIC PER D/C SUMMARY MARCH 2011   NO PROBLEMS SINCE  . Joint pain   . Lactose intolerance   . Multiple food allergies    coconut  . Osteoarthritis   . Pericarditis   . Primary localized osteoarthritis of right knee 11/25/2011   S/P Left Knee arthroscopy performed by Dr. Charlann Boxer in April 2013.   . Seasonal allergies   . Torn rotator cuff      PAST SURGICAL HISTORY: Past Surgical History:  Procedure Laterality Date  . APPENDECTOMY  1998  . BILATERAL CARPAL TUNNEL RELEASE  1980'S  . BREAST REDUCTION SURGERY Bilateral 12/21/2012   Procedure: BILATERAL MAMMARY REDUCTION  (BREAST);  Surgeon: Etter Sjogren, MD;  Location: [facility];  Service: Plastics;  Laterality: Bilateral;  . CHOLECYSTECTOMY  1992  . KNEE ARTHROSCOPY  06/20/2011   Procedure: ARTHROSCOPY KNEE;  Surgeon: Shelda Pal, MD;  Location: [facility];  Service: Orthopedics;  Laterality: Left;  left knee scope   latex allergy patient blisters and swells  . PARTIAL KNEE ARTHROPLASTY Right 03/03/2014   Procedure: RIGHT UNICOMPARTMENTAL KNEE ARTHROPLASTY;  Surgeon: Eulas Post, MD;  Location: [facility];  Service: Orthopedics;  Laterality: Right;  . TRANSTHORACIC ECHOCARDIOGRAM  04-26-2009   NORMAL LVSF/ EF 60-65% / LVDF NORMAL / NO PERICARDIAL EFFUSION  . VAGINAL HYSTERECTOMY  03-18-1999    SOCIAL HISTORY: Social History  Substance Use Topics  . Smoking status: Never Smoker  . Smokeless tobacco: Never Used  . Alcohol use No    FAMILY HISTORY: Family History  Problem Relation Age of Onset  . Cancer Brother   . Asthma Son     ROS: Review of Systems  Constitutional: Negative for weight loss.  Gastrointestinal: Negative for nausea and vomiting.  Musculoskeletal:       Negative muscle weakness  Psychiatric/Behavioral: Positive for depression. Negative for suicidal ideas.    PHYSICAL EXAM: Blood pressure 125/73, pulse 67, temperature 97.9 F (36.6 C), temperature source Oral, height 5\' 3"  (1.6 m), weight 160 lb (72.6 kg), SpO2 100 %. Body mass index is 28.34 kg/m. Physical Exam  Constitutional: She is oriented to person, place, and time. She appears well-developed and well-nourished.  Cardiovascular: Normal rate.   Pulmonary/Chest: Effort normal.  Musculoskeletal: Normal range of motion.  Neurological:  She is oriented to person, place, and time.  Skin: Skin is warm and dry.  Psychiatric: She has a normal mood and affect. Her behavior is normal.  Vitals reviewed.   RECENT LABS AND TESTS: BMET    Component Value Date/Time   NA 138 09/01/2016 1004   K 4.6 09/01/2016 1004   CL 101 09/01/2016 1004   CO2 24 09/01/2016 1004   GLUCOSE 71 09/01/2016 1004   GLUCOSE 89 03/09/2012 1915   BUN 16 09/01/2016 1004   CREATININE 0.64 09/01/2016 1004   CALCIUM 9.2 09/01/2016 1004   GFRNONAA 103 09/01/2016 1004   GFRAA 119 09/01/2016 1004   Lab Results  Component Value Date   HGBA1C 4.9 05/20/2016   Lab Results  Component Value Date   INSULIN 5.7 05/20/2016   CBC  Component Value Date/Time   WBC 10.3 05/20/2016 0954   WBC 11.5 (H) 03/09/2012 1915   RBC 4.22 05/20/2016 0954   RBC 4.32 03/09/2012 1915   HGB 12.9 05/20/2016 0954   HCT 40.1 05/20/2016 0954   PLT 343 05/20/2016 0954   MCV 95 05/20/2016 0954   MCH 30.6 05/20/2016 0954   MCH 31.3 03/09/2012 1915   MCHC 32.2 05/20/2016 0954   MCHC 34.0 03/09/2012 1915   RDW 13.2 05/20/2016 0954   LYMPHSABS 2.6 05/20/2016 0954   MONOABS 0.6 03/09/2012 1915   EOSABS 0.1 05/20/2016 0954   BASOSABS 0.0 05/20/2016 0954   Iron/TIBC/Ferritin/ %Sat No results found for: IRON, TIBC, FERRITIN, IRONPCTSAT Lipid Panel     Component Value Date/Time   CHOL 154 09/01/2016 1004   TRIG 46 09/01/2016 1004   HDL 54 09/01/2016 1004   LDLCALC 91 09/01/2016 1004   Hepatic Function Panel     Component Value Date/Time   PROT 6.9 09/01/2016 1004   ALBUMIN 4.0 09/01/2016 1004   AST 19 09/01/2016 1004   ALT 15 09/01/2016 1004   ALKPHOS 61 09/01/2016 1004   BILITOT 0.3 09/01/2016 1004      Component Value Date/Time   TSH 2.040 05/20/2016 0954    ASSESSMENT AND PLAN: Vitamin D deficiency - Plan: Vitamin D, Ergocalciferol, (DRISDOL) 50000 units CAPS capsule  Other depression - Plan: buPROPion (WELLBUTRIN SR) 200 MG 12 hr tablet  At risk  for osteopenia  Class 1 obesity without serious comorbidity with body mass index (BMI) of 30.0 to 30.9 in adult, unspecified obesity type - patient BMI >30 when she began the program  PLAN:  Vitamin D Deficiency Elisse was informed that low vitamin D levels contributes to fatigue and are associated with obesity, breast, and colon cancer. She agrees to continue to take prescription Vit D @50 ,000 IU every week, we will refill for 1 month and will follow up for routine testing of vitamin D, at least 2-3 times per year. She was informed of the risk of over-replacement of vitamin D and agrees to not increase her dose unless he discusses this with [place] first. Starlena agrees to follow up with our clinic in 2 weeks.  Depression with Emotional Eating Behaviors We discussed behavior modification techniques today to help Lanetra deal with her emotional eating and depression. She has agreed to continue to take Wellbutrin SR 200 mg qd #30 with no refills and will follow up as directed.  At risk for osteopenia Casilda is at risk for osteopenia and osteoporsis due to her vitamin D deficiency. She was encouraged to take her vitamin D and follow her higher calcium diet and increase strengthening exercise to help strengthen her bones and decrease her risk of osteopenia and osteoporosis.  Obesity Dhwani is currently in the action stage of change. As such, her goal is to continue with weight loss efforts She has agreed to follow a lower carbohydrate, vegetable and lean protein rich diet plan Sharne has been instructed to work up to a goal of 150 minutes of combined cardio and strengthening exercise per week for weight loss and overall health benefits. We discussed the following Behavioral Modification Strategies today: meal planning & cooking strategies and increasing lean protein intake  Aedyn has agreed to follow up with our clinic in 2 weeks. She was informed of the importance of frequent  follow up visits to maximize her success with intensive lifestyle modifications for her multiple health conditions.  Cristi Loron, am acting as  transcriptionist for Illa Level, PA-C  I have reviewed the above documentation for accuracy and completeness, and I agree with the above. -Illa Level, PA-C    OBESITY BEHAVIORAL INTERVENTION VISIT  Today's visit was # 11 out of 22.  Starting weight: 173 lbs Starting date: 05/20/16 Today's weight : 160 lbs Today's date: 10/13/2016 Total lbs lost to date: 13 (Patients must lose 7 lbs in the first 6 months to continue with counseling)   ASK: We discussed the diagnosis of obesity with [name] today and Ambrianna agreed to give [place] permission to discuss obesity behavioral modification therapy today.  ASSESS: Lyndsie has the diagnosis of obesity and her BMI today is 28.4 Aydee is in the action stage of change   ADVISE: Zhariyah was educated on the multiple health risks of obesity as well as the benefit of weight loss to improve her health. She was advised of the need for long term treatment and the importance of lifestyle modifications.  AGREE: Multiple dietary modification options and treatment options were discussed and  Danya agreed to follow a lower carbohydrate, vegetable and lean protein rich diet plan We discussed the following Behavioral Modification Strategies today: meal planning & cooking strategies and increasing lean protein intake

## 2016-10-26 IMAGING — CR DG KNEE 1-2V PORT*R*
2 series · 2 of 2 positions shown · non-contrast
Comparison: None.

CLINICAL DATA: Status post right unicompartmental knee replacement

EXAM:
PORTABLE RIGHT KNEE - 1-2 VIEW

[ap/obl knee]
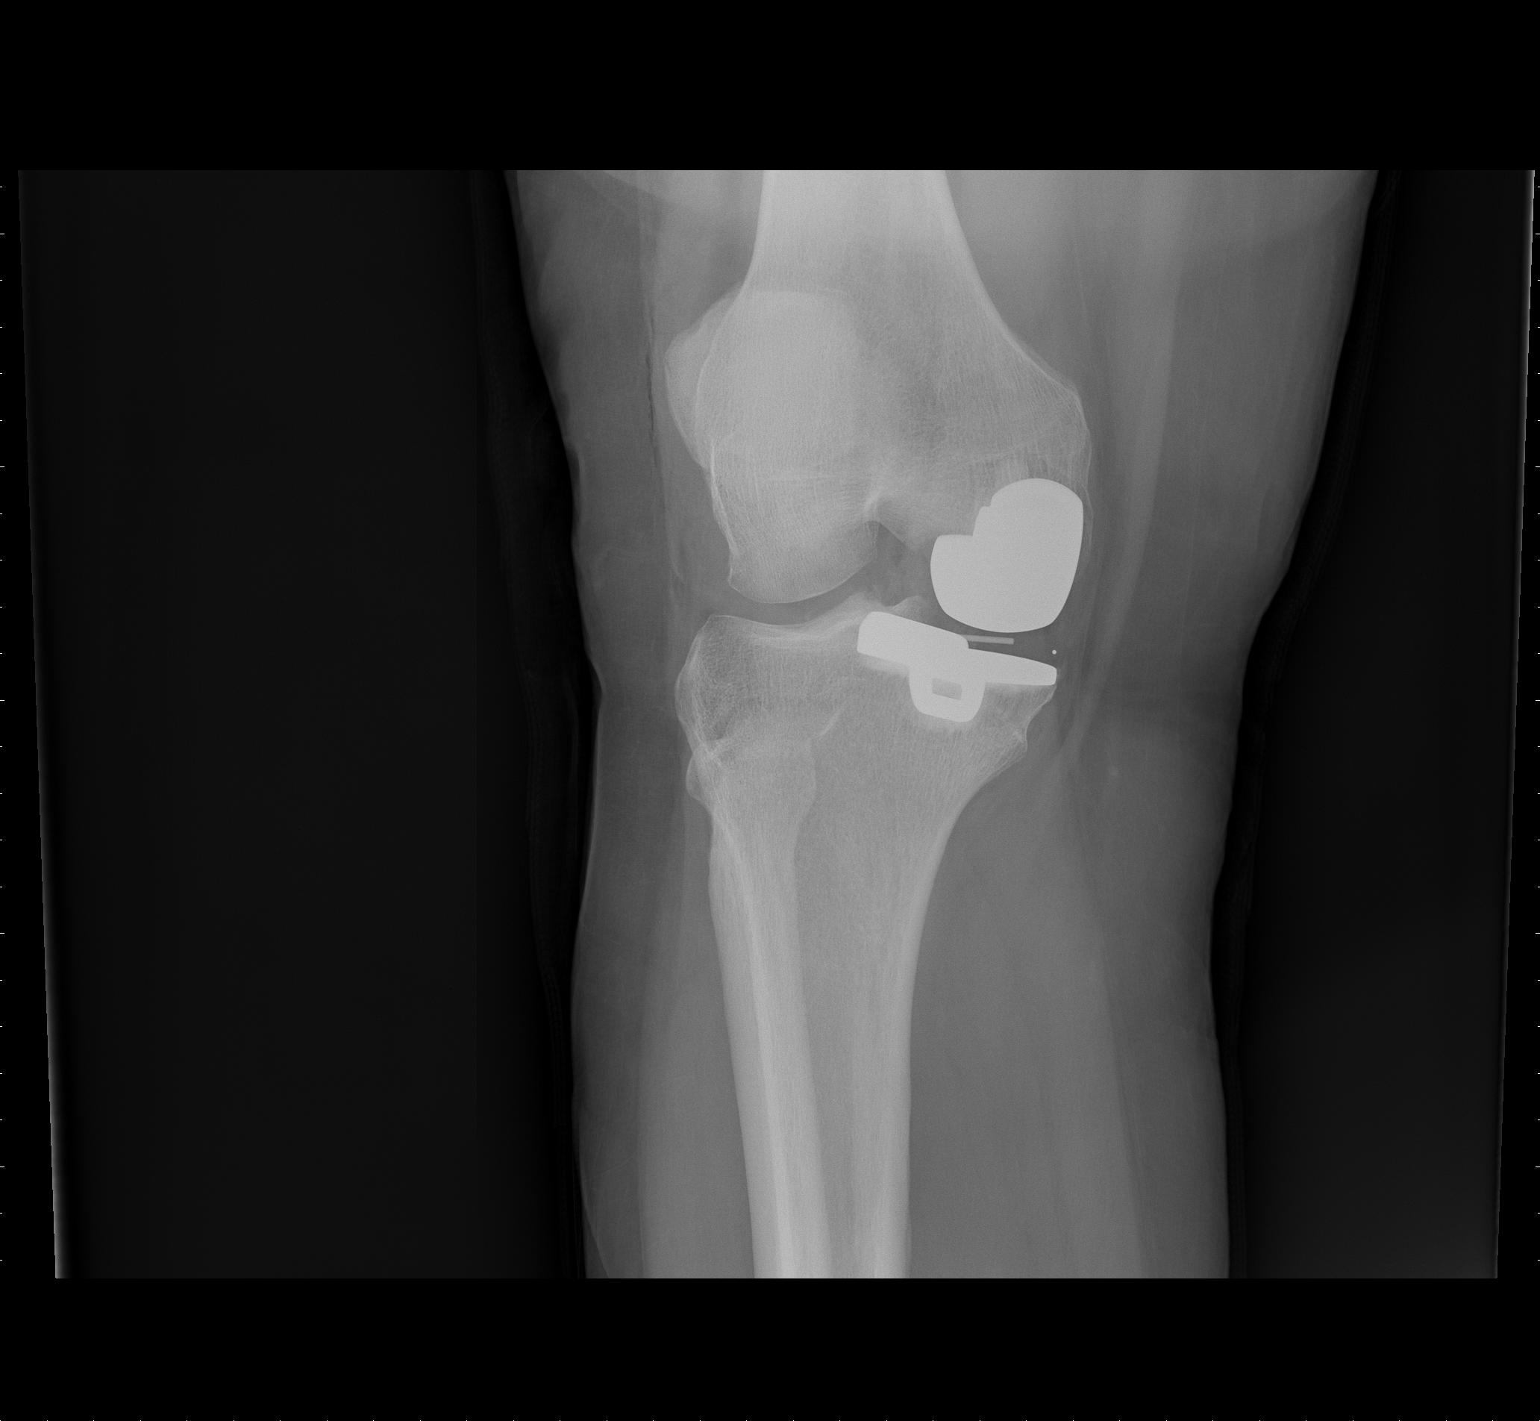

[knee lat]
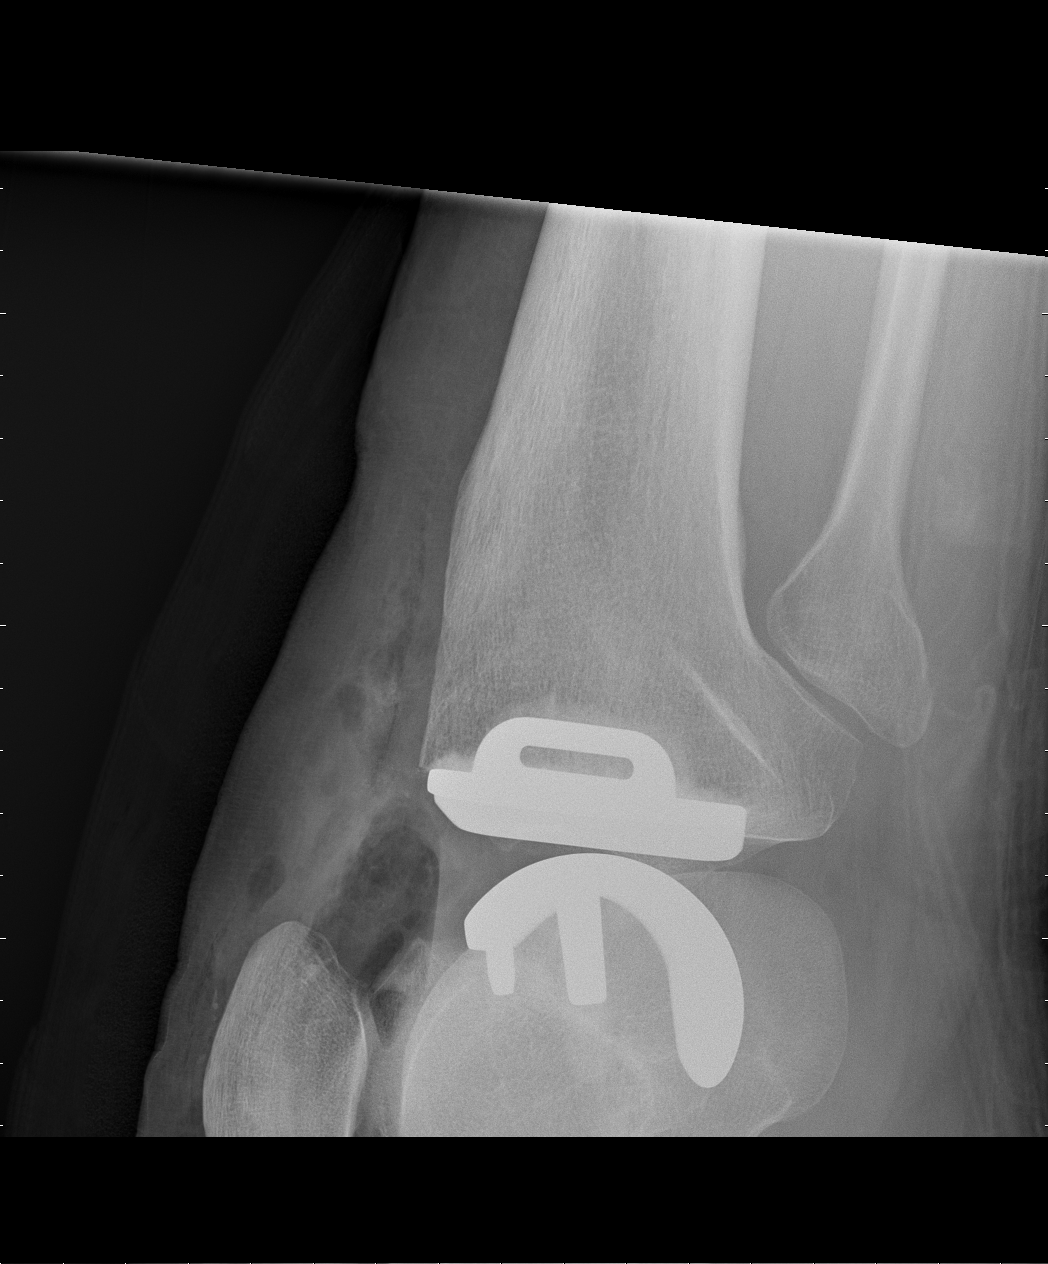

[2 of 2 positions shown; findings below may reference images not displayed]

FINDINGS: Knee replacement components in the distal femur and proximal tibia
of the medial compartment. Postoperative air-fluid over the knee
joint.
IMPRESSION: Anticipated postoperative appearance

## 2016-10-28 ENCOUNTER — Ambulatory Visit (INDEPENDENT_AMBULATORY_CARE_PROVIDER_SITE_OTHER): Payer: 59 | Admitting: Physician Assistant

## 2016-10-28 VITALS — BP 119/77 | HR 71 | Temp 97.8°F | Ht 63.0 in | Wt 159.0 lb

## 2016-10-28 DIAGNOSIS — E559 Vitamin D deficiency, unspecified: Secondary | ICD-10-CM

## 2016-10-28 DIAGNOSIS — Z9189 Other specified personal risk factors, not elsewhere classified: Secondary | ICD-10-CM | POA: Diagnosis not present

## 2016-10-28 DIAGNOSIS — F3289 Other specified depressive episodes: Secondary | ICD-10-CM | POA: Diagnosis not present

## 2016-10-28 DIAGNOSIS — E669 Obesity, unspecified: Secondary | ICD-10-CM

## 2016-10-28 DIAGNOSIS — Z6830 Body mass index (BMI) 30.0-30.9, adult: Secondary | ICD-10-CM | POA: Diagnosis not present

## 2016-10-28 MED ORDER — VITAMIN D (ERGOCALCIFEROL) 1.25 MG (50000 UNIT) PO CAPS
50000.0000 [IU] | ORAL_CAPSULE | ORAL | 0 refills | Status: DC
Start: 2016-10-28 — End: 2016-11-25

## 2016-10-28 MED ORDER — BUPROPION HCL ER (SR) 200 MG PO TB12: 200.0000 mg | ORAL_TABLET | Freq: Every day | ORAL | 0 refills | Status: DC

## 2016-10-28 MED FILL — BUPROPION HCL SR 200 MG TAB: 200 | 30 days supply | Qty: 30 | Fill #0

## 2016-10-28 MED FILL — VIT D2 1.25 MG (50,000 UNIT: 1.25 MG | 28 days supply | Qty: 4 | Fill #0

## 2016-10-28 NOTE — Progress Notes (Signed)
Office: [phone]  /  Fax: [phone]   HPI:   Chief Complaint: OBESITY Dawn [name] is here to discuss her progress with her obesity treatment plan. She is on the lower carbohydrate, vegetable and lean protein rich diet plan and is following her eating plan approximately 90 % of the time. She states she is walking for 30 minutes 3 times per week. Dawn [name] continues to do well with weight loss. She will be going on vacation and wants eating strategies for vacation. Her weight is 159 lb (72.1 kg) today and has had a weight loss of 1 pound over a period of 2 weeks since her last visit. She has lost 14 lbs since starting treatment with us.  Vitamin D deficiency Dawn [name] has a diagnosis of vitamin D deficiency. She is currently taking vit D and denies nausea, vomiting or muscle weakness.  At risk for osteopenia and osteoporosis Dawn [name] is at higher risk of osteopenia and osteoporosis due to vitamin D deficiency.   Depression with emotional eating behaviors Dawn [name] is struggling with emotional eating and using food for comfort to the extent that it is negatively impacting her health. She often snacks when she is not hungry. Dawn [name] sometimes feels she is out of control and then feels guilty that she made poor food choices. She has been working on behavior modification techniques to help reduce her emotional eating and has been somewhat successful. Her mood is stable currently and she shows no sign of suicidal or homicidal ideations.  [facility]  Decreased Interest 1  Down, Depressed, Hopeless 0  PHQ - 2 Score 1  Altered sleeping 1  Tired, decreased energy 1  Change in appetite 1  Feeling bad or failure about yourself  0  Trouble concentrating 0  Moving slowly or fidgety/restless 0  Suicidal thoughts 0  PHQ-9 Score 4      ALLERGIES: Allergies  Allergen Reactions  . Coconut Oil Anaphylaxis, Shortness Of Breath and Swelling    Coconut  . Latex  Swelling    AND BLISTERS  . Sulfa Antibiotics Anaphylaxis    MEDICATIONS: Current Outpatient Prescriptions on File Prior to Visit  Medication Sig Dispense Refill  . albuterol (PROVENTIL HFA;VENTOLIN HFA) 108 (90 BASE) MCG/ACT inhaler Inhale 2 puffs into the lungs every 6 (six) hours as needed for wheezing.    Marland Kitchen. b complex vitamins tablet Take 1 tablet by mouth daily.    . Biotin 5000 MCG CAPS Take by mouth every morning.    Marland Kitchen. buPROPion (WELLBUTRIN SR) 200 MG 12 hr tablet Take 1 tablet (200 mg total) by mouth daily. 30 tablet 0  . docusate sodium (COLACE) 100 MG capsule Take 1 capsule (100 mg total) by mouth 2 (two) times daily. 60 capsule 0  . EPINEPHrine 0.3 mg/0.3 mL IJ SOAJ injection Inject into the muscle once.    Marland Kitchen. levocetirizine (XYZAL) 5 MG tablet Take 5 mg by mouth every evening.    . Multiple Vitamins-Minerals (MULTIVITAMIN WITH MINERALS) tablet Take by mouth daily.     Marland Kitchen. omega-3 acid ethyl esters (LOVAZA) 1 g capsule Take by mouth every morning.    . Vitamin D, Ergocalciferol, (DRISDOL) 50000 units CAPS capsule Take 1 capsule (50,000 Units total) by mouth every 7 (seven) days. 4 capsule 0   No current facility-administered medications on file prior to visit.     PAST MEDICAL HISTORY: Past Medical History:  Diagnosis Date  . Acute meniscal tear of knee LEFT  . Arthritis   .  Asthma   . Constipation   . Contact lens/glasses fitting    wears contacts or glasses  . Environmental allergies   . Gallbladder problem   . History of gastric ulcer AS TEEN  . History of stomach ulcers   . History of viral pericarditis PROBABLE OR IDIOPATHIC PER D/C SUMMARY MARCH 2011   NO PROBLEMS SINCE  . Joint pain   . Lactose intolerance   . Multiple food allergies    coconut  . Osteoarthritis   . Pericarditis   . Primary localized osteoarthritis of right knee 11/25/2011   S/P Left Knee arthroscopy performed by Dr. Charlann Boxer in April 2013.   . Seasonal allergies   . Torn rotator cuff      PAST SURGICAL HISTORY: Past Surgical History:  Procedure Laterality Date  . APPENDECTOMY  1998  . BILATERAL CARPAL TUNNEL RELEASE  1980'S  . BREAST REDUCTION SURGERY Bilateral 12/21/2012   Procedure: BILATERAL MAMMARY REDUCTION  (BREAST);  Surgeon: Etter Sjogren, MD;  Location: [facility];  Service: Plastics;  Laterality: Bilateral;  . CHOLECYSTECTOMY  1992  . KNEE ARTHROSCOPY  06/20/2011   Procedure: ARTHROSCOPY KNEE;  Surgeon: Shelda Pal, MD;  Location: [facility];  Service: Orthopedics;  Laterality: Left;  left knee scope   latex allergy patient blisters and swells  . PARTIAL KNEE ARTHROPLASTY Right 03/03/2014   Procedure: RIGHT UNICOMPARTMENTAL KNEE ARTHROPLASTY;  Surgeon: Eulas Post, MD;  Location: [facility];  Service: Orthopedics;  Laterality: Right;  . TRANSTHORACIC ECHOCARDIOGRAM  04-26-2009   NORMAL LVSF/ EF 60-65% / LVDF NORMAL / NO PERICARDIAL EFFUSION  . VAGINAL HYSTERECTOMY  03-18-1999    SOCIAL HISTORY: Social History  Substance Use Topics  . Smoking status: Never Smoker  . Smokeless tobacco: Never Used  . Alcohol use No    FAMILY HISTORY: Family History  Problem Relation Age of Onset  . Cancer Brother   . Asthma Son     ROS: Review of Systems  Constitutional: Positive for weight loss.  Gastrointestinal: Negative for nausea and vomiting.  Musculoskeletal:       Negative muscle weakness  Psychiatric/Behavioral: Positive for depression. Negative for suicidal ideas.    PHYSICAL EXAM: Blood pressure 119/77, pulse 71, temperature 97.8 F (36.6 C), temperature source Oral, height 5\' 3"  (1.6 m), weight 159 lb (72.1 kg), SpO2 100 %. Body mass index is 28.17 kg/m. Physical Exam  Constitutional: She is oriented to person, place, and time. She appears well-developed and well-nourished.  Cardiovascular: Normal rate.   Pulmonary/Chest: Effort normal.  Musculoskeletal: Normal range of motion.  Neurological:  She is oriented to person, place, and time.  Skin: Skin is warm and dry.  Psychiatric: She has a normal mood and affect. Her behavior is normal.  Vitals reviewed.   RECENT LABS AND TESTS: BMET    Component Value Date/Time   NA 138 09/01/2016 1004   K 4.6 09/01/2016 1004   CL 101 09/01/2016 1004   CO2 24 09/01/2016 1004   GLUCOSE 71 09/01/2016 1004   GLUCOSE 89 03/09/2012 1915   BUN 16 09/01/2016 1004   CREATININE 0.64 09/01/2016 1004   CALCIUM 9.2 09/01/2016 1004   GFRNONAA 103 09/01/2016 1004   GFRAA 119 09/01/2016 1004   Lab Results  Component Value Date   HGBA1C 4.9 05/20/2016   Lab Results  Component Value Date   INSULIN 5.7 05/20/2016   CBC    Component Value Date/Time   WBC 10.3 05/20/2016 0954  WBC 11.5 (H) 03/09/2012 1915   RBC 4.22 05/20/2016 0954   RBC 4.32 03/09/2012 1915   HGB 12.9 05/20/2016 0954   HCT 40.1 05/20/2016 0954   PLT 343 05/20/2016 0954   MCV 95 05/20/2016 0954   MCH 30.6 05/20/2016 0954   MCH 31.3 03/09/2012 1915   MCHC 32.2 05/20/2016 0954   MCHC 34.0 03/09/2012 1915   RDW 13.2 05/20/2016 0954   LYMPHSABS 2.6 05/20/2016 0954   MONOABS 0.6 03/09/2012 1915   EOSABS 0.1 05/20/2016 0954   BASOSABS 0.0 05/20/2016 0954   Iron/TIBC/Ferritin/ %Sat No results found for: IRON, TIBC, FERRITIN, IRONPCTSAT Lipid Panel     Component Value Date/Time   CHOL 154 09/01/2016 1004   TRIG 46 09/01/2016 1004   HDL 54 09/01/2016 1004   LDLCALC 91 09/01/2016 1004   Hepatic Function Panel     Component Value Date/Time   PROT 6.9 09/01/2016 1004   ALBUMIN 4.0 09/01/2016 1004   AST 19 09/01/2016 1004   ALT 15 09/01/2016 1004   ALKPHOS 61 09/01/2016 1004   BILITOT 0.3 09/01/2016 1004      Component Value Date/Time   TSH 2.040 05/20/2016 0954    ASSESSMENT AND PLAN: Vitamin D deficiency - Plan: Vitamin D, Ergocalciferol, (DRISDOL) 50000 units CAPS capsule  Other depression - Plan: buPROPion (WELLBUTRIN SR) 200 MG 12 hr tablet  At risk  for osteoporosis  Class 1 obesity with serious comorbidity and body mass index (BMI) of 30.0 to 30.9 in adult, unspecified obesity type - Starting BMI greater than 30  PLAN:  Vitamin D Deficiency Dawn [name] was informed that low vitamin D levels contributes to fatigue and are associated with obesity, breast, and colon cancer. She agrees to continue to take prescription Vit D @50 ,000 IU every week, we will refill for 1 month and will follow up for routine testing of vitamin D, at least 2-3 times per year. She was informed of the risk of over-replacement of vitamin D and agrees to not increase her dose unless he discusses this with [place] first. Dawn [name] agrees to follow up with our clinic in 4 weeks.  At risk for osteopenia and osteoporosis Dawn [name] is at risk for osteopenia and osteoporosis due to her vitamin D deficiency. She was encouraged to take her vitamin D and follow her higher calcium diet and increase strengthening exercise to help strengthen her bones and decrease her risk of osteopenia and osteoporosis.  Depression with Emotional Eating Behaviors We discussed behavior modification techniques today to help Dawn [name] deal with her emotional eating and depression. She has agreed to continue Wellbutrin SR 200 mg qd #30 with no refills and will follow up as directed.  Obesity Dawn [name] is currently in the action stage of change. As such, her goal is to continue with weight loss efforts She has agreed to follow the Category 1 plan Dawn [name] has been instructed to work up to a goal of 150 minutes of combined cardio and strengthening exercise per week for weight loss and overall health benefits. We discussed the following Behavioral Modification Strategies today: increasing lean protein intake and celebration eating strategies  Dawn [name] has agreed to follow up with our clinic in 4 weeks. She was informed of the importance of frequent follow up visits to maximize her success with intensive  lifestyle modifications for her multiple health conditions.  I, [place] Crane, am acting as transcriptionist for Illa Level, PA-C  I have reviewed the above documentation for accuracy and completeness, and I agree with the above. -  Illa Level, PA-C  I have reviewed the above note and agree with the plan. -Quillian Quince, MD   OBESITY BEHAVIORAL INTERVENTION VISIT  Today's visit was # 12 out of 22.  Starting weight: 173 lbs Starting date: 05/20/16 Today's weight : 159 lbs Today's date: 10/28/2016 Total lbs lost to date: 14 (Patients must lose 7 lbs in the first 6 months to continue with counseling)   ASK: We discussed the diagnosis of obesity with Dawn [name] today and Dawn [name] agreed to give [place] permission to discuss obesity behavioral modification therapy today.  ASSESS: Dawn [name] has the diagnosis of obesity and her BMI today is 28.17 Dawn [name] is in the action stage of change   ADVISE: Dawn [name] was educated on the multiple health risks of obesity as well as the benefit of weight loss to improve her health. She was advised of the need for long term treatment and the importance of lifestyle modifications.  AGREE: Multiple dietary modification options and treatment options were discussed and  Dawn [name] agreed to follow the Category 1 plan We discussed the following Behavioral Modification Strategies today: increasing lean protein intake and celebration eating strategies

## 2016-11-25 ENCOUNTER — Ambulatory Visit (INDEPENDENT_AMBULATORY_CARE_PROVIDER_SITE_OTHER): Payer: 59 | Admitting: Physician Assistant

## 2016-11-25 VITALS — BP 114/72 | HR 82 | Temp 98.0°F | Ht 63.0 in | Wt 159.0 lb

## 2016-11-25 DIAGNOSIS — F3289 Other specified depressive episodes: Secondary | ICD-10-CM

## 2016-11-25 DIAGNOSIS — Z9189 Other specified personal risk factors, not elsewhere classified: Secondary | ICD-10-CM

## 2016-11-25 DIAGNOSIS — Z6830 Body mass index (BMI) 30.0-30.9, adult: Secondary | ICD-10-CM | POA: Diagnosis not present

## 2016-11-25 DIAGNOSIS — E559 Vitamin D deficiency, unspecified: Secondary | ICD-10-CM

## 2016-11-25 DIAGNOSIS — E669 Obesity, unspecified: Secondary | ICD-10-CM | POA: Diagnosis not present

## 2016-11-25 MED ORDER — VITAMIN D (ERGOCALCIFEROL) 1.25 MG (50000 UNIT) PO CAPS: 50000.0000 [IU] | ORAL_CAPSULE | ORAL | 0 refills | Status: DC

## 2016-11-25 MED ORDER — BUPROPION HCL ER (SR) 200 MG PO TB12: 200.0000 mg | ORAL_TABLET | Freq: Every day | ORAL | 0 refills | Status: DC

## 2016-11-25 NOTE — Progress Notes (Signed)
Office: [phone]  /  Fax: [phone]   HPI:   Chief Complaint: OBESITY Dawn [name] is here to discuss her progress with her obesity treatment plan. She is on the Category 1 plan and is following her eating plan approximately 50 % of the time. She states she is exercising 0 minutes 0 times per week. Dawn [name] was on vacation and tried to made smarter food choices and controlled her portions. She has gotten back on track after coming home, and is not following the plan. She is motivated to continue with her weight loss efforts.   Her weight is 159 lb (72.1 kg) today and has maintained weight over a period of 4 weeks since her last visit. She has lost 14 lbs since starting treatment with [place].  Vitamin D deficiency Dawn [name] has a diagnosis of vitamin D deficiency. She is currently taking vit D and denies nausea, vomiting or muscle weakness.  At risk for osteopenia and osteoporosis Dawn [name] is at higher risk of osteopenia and osteoporosis due to vitamin D deficiency.   Depression with emotional eating behaviors Dawn [name] is struggling with emotional eating and using food for comfort to the extent that it is negatively impacting her health. She often snacks when she is not hungry. Dawn [name] sometimes feels she is out of control and then feels guilty that she made poor food choices. She has been working on behavior modification techniques to help reduce her emotional eating and has been somewhat successful. Her mood is stable and she shows no sign of suicidal or homicidal ideations.  Depression screen [facility] 2/9 05/20/2016  Decreased Interest 1  Down, Depressed, Hopeless 0  PHQ - 2 Score 1  Altered sleeping 1  Tired, decreased energy 1  Change in appetite 1  Feeling bad or failure about yourself  0  Trouble concentrating 0  Moving slowly or fidgety/restless 0  Suicidal thoughts 0  PHQ-9 Score 4      ALLERGIES: Allergies  Allergen Reactions  . Coconut Oil Anaphylaxis, Shortness Of  Breath and Swelling    Coconut  . Latex Swelling    AND BLISTERS  . Sulfa Antibiotics Anaphylaxis    MEDICATIONS: Current Outpatient Prescriptions on File Prior to Visit  Medication Sig Dispense Refill  . albuterol (PROVENTIL HFA;VENTOLIN HFA) 108 (90 BASE) MCG/ACT inhaler Inhale 2 puffs into the lungs every 6 (six) hours as needed for wheezing.    Marland Kitchen b complex vitamins tablet Take 1 tablet by mouth daily.    . Biotin 5000 MCG CAPS Take by mouth every morning.    Marland Kitchen buPROPion (WELLBUTRIN SR) 200 MG 12 hr tablet Take 1 tablet (200 mg total) by mouth daily. 30 tablet 0  . docusate sodium (COLACE) 100 MG capsule Take 1 capsule (100 mg total) by mouth 2 (two) times daily. 60 capsule 0  . EPINEPHrine 0.3 mg/0.3 mL IJ SOAJ injection Inject into the muscle once.    Marland Kitchen levocetirizine (XYZAL) 5 MG tablet Take 5 mg by mouth every evening.    . Multiple Vitamins-Minerals (MULTIVITAMIN WITH MINERALS) tablet Take by mouth daily.     Marland Kitchen omega-3 acid ethyl esters (LOVAZA) 1 g capsule Take by mouth every morning.    . Vitamin D, Ergocalciferol, (DRISDOL) 50000 units CAPS capsule Take 1 capsule (50,000 Units total) by mouth every 7 (seven) days. 4 capsule 0   No current facility-administered medications on file prior to visit.     PAST MEDICAL HISTORY: Past Medical History:  Diagnosis Date  . Acute meniscal tear  of knee LEFT  . Arthritis   . Asthma   . Constipation   . Contact lens/glasses fitting    wears contacts or glasses  . Environmental allergies   . Gallbladder problem   . History of gastric ulcer AS TEEN  . History of stomach ulcers   . History of viral pericarditis PROBABLE OR IDIOPATHIC PER D/C SUMMARY MARCH 2011   NO PROBLEMS SINCE  . Joint pain   . Lactose intolerance   . Multiple food allergies    coconut  . Osteoarthritis   . Pericarditis   . Primary localized osteoarthritis of right knee 11/25/2011   S/P Left Knee arthroscopy performed by Dr. Charlann Boxer in April 2013.   . Seasonal  allergies   . Torn rotator cuff     PAST SURGICAL HISTORY: Past Surgical History:  Procedure Laterality Date  . APPENDECTOMY  1998  . BILATERAL CARPAL TUNNEL RELEASE  1980'S  . BREAST REDUCTION SURGERY Bilateral 12/21/2012   Procedure: BILATERAL MAMMARY REDUCTION  (BREAST);  Surgeon: Etter Sjogren, MD;  Location: [facility];  Service: Plastics;  Laterality: Bilateral;  . CHOLECYSTECTOMY  1992  . KNEE ARTHROSCOPY  06/20/2011   Procedure: ARTHROSCOPY KNEE;  Surgeon: Shelda Pal, MD;  Location: [facility];  Service: Orthopedics;  Laterality: Left;  left knee scope   latex allergy patient blisters and swells  . PARTIAL KNEE ARTHROPLASTY Right 03/03/2014   Procedure: RIGHT UNICOMPARTMENTAL KNEE ARTHROPLASTY;  Surgeon: Eulas Post, MD;  Location: [facility];  Service: Orthopedics;  Laterality: Right;  . TRANSTHORACIC ECHOCARDIOGRAM  04-26-2009   NORMAL LVSF/ EF 60-65% / LVDF NORMAL / NO PERICARDIAL EFFUSION  . VAGINAL HYSTERECTOMY  03-18-1999    SOCIAL HISTORY: Social History  Substance Use Topics  . Smoking status: Never Smoker  . Smokeless tobacco: Never Used  . Alcohol use No    FAMILY HISTORY: Family History  Problem Relation Age of Onset  . Cancer Brother   . Asthma Son     ROS: Review of Systems  Constitutional: Negative for weight loss.  Gastrointestinal: Negative for nausea and vomiting.  Musculoskeletal:       Negative muscle weakness  Psychiatric/Behavioral: Positive for depression. Negative for suicidal ideas.    PHYSICAL EXAM: Blood pressure 114/72, pulse 82, temperature 98 F (36.7 C), temperature source Oral, height  (1.6 m), weight 159 lb (72.1 kg), SpO2 100 %. Body mass index is 28.17 kg/m. Physical Exam  Constitutional: She is oriented to person, place, and time. She appears well-developed and well-nourished.  Cardiovascular: Normal rate.   Pulmonary/Chest: Effort normal.  Musculoskeletal: Normal  range of motion.  Neurological: She is oriented to person, place, and time.  Skin: Skin is warm and dry.  Psychiatric: She has a normal mood and affect. Her behavior is normal.  Vitals reviewed.   RECENT LABS AND TESTS: BMET    Component Value Date/Time   NA 138 09/01/2016 1004   K 4.6 09/01/2016 1004   CL 101 09/01/2016 1004   CO2 24 09/01/2016 1004   GLUCOSE 71 09/01/2016 1004   GLUCOSE 89 03/09/2012 1915   BUN 16 09/01/2016 1004   CREATININE 0.64 09/01/2016 1004   CALCIUM 9.2 09/01/2016 1004   GFRNONAA 103 09/01/2016 1004   GFRAA 119 09/01/2016 1004   Lab Results  Component Value Date   HGBA1C 4.9 05/20/2016   Lab Results  Component Value Date   INSULIN 5.7 05/20/2016   CBC    Component  Value Date/Time   WBC 10.3 05/20/2016 0954   WBC 11.5 (H) 03/09/2012 1915   RBC 4.22 05/20/2016 0954   RBC 4.32 03/09/2012 1915   HGB 12.9 05/20/2016 0954   HCT 40.1 05/20/2016 0954   PLT 343 05/20/2016 0954   MCV 95 05/20/2016 0954   MCH 30.6 05/20/2016 0954   MCH 31.3 03/09/2012 1915   MCHC 32.2 05/20/2016 0954   MCHC 34.0 03/09/2012 1915   RDW 13.2 05/20/2016 0954   LYMPHSABS 2.6 05/20/2016 0954   MONOABS 0.6 03/09/2012 1915   EOSABS 0.1 05/20/2016 0954   BASOSABS 0.0 05/20/2016 0954   Iron/TIBC/Ferritin/ %Sat No results found for: IRON, TIBC, FERRITIN, IRONPCTSAT Lipid Panel     Component Value Date/Time   CHOL 154 09/01/2016 1004   TRIG 46 09/01/2016 1004   HDL 54 09/01/2016 1004   LDLCALC 91 09/01/2016 1004   Hepatic Function Panel     Component Value Date/Time   PROT 6.9 09/01/2016 1004   ALBUMIN 4.0 09/01/2016 1004   AST 19 09/01/2016 1004   ALT 15 09/01/2016 1004   ALKPHOS 61 09/01/2016 1004   BILITOT 0.3 09/01/2016 1004      Component Value Date/Time   TSH 2.040 05/20/2016 0954    ASSESSMENT AND PLAN: Vitamin D deficiency - Plan: Vitamin D, Ergocalciferol, (DRISDOL) 50000 units CAPS capsule  Other depression - with emotional eating - Plan:  buPROPion (WELLBUTRIN SR) 200 MG 12 hr tablet  At risk for osteoporosis  Class 1 obesity with serious comorbidity and body mass index (BMI) of 30.0 to 30.9 in adult, unspecified obesity type - starting BMI greater than 30  PLAN:  Vitamin D Deficiency Dawn [name] was informed that low vitamin D levels contributes to fatigue and are associated with obesity, breast, and colon cancer. She agrees to continue to take prescription Vit D ,000 IU every week, we will refill for 1 month and will follow up for routine testing of vitamin D, at least 2-3 times per year. She was informed of the risk of over-replacement of vitamin D and agrees to not increase her dose unless he discusses this with [place] first. Dawn [name] agrees to follow up with our clinic in 2 to 3 weeks.  At risk for osteopenia and osteoporosis Dawn [name] is at risk for osteopenia and osteoporosis due to her vitamin D deficiency. She was encouraged to take her vitamin D and follow her higher calcium diet and increase strengthening exercise to help strengthen her bones and decrease her risk of osteopenia and osteoporosis.  Depression with Emotional Eating Behaviors We discussed behavior modification techniques today to help Dawn [name] deal with her emotional eating and depression. She has agreed to take Wellbutrin SR 200 mg qd  #30 with no refills and will follow up as directed.  Obesity Dawn [name] is currently in the action stage of change. As such, her goal is to continue with weight loss efforts She has agreed to follow the Category 1 plan Dawn [name] has been instructed to work up to a goal of 150 minutes of combined cardio and strengthening exercise per week for weight loss and overall health benefits. We discussed the following Behavioral Modification Strategies today: increasing lean protein intake and work on meal planning and easy cooking plans  Dawn [name] has agreed to follow up with our clinic in 2 to 3 weeks. She was informed of the  importance of frequent follow up visits to maximize her success with intensive lifestyle modifications for her multiple health conditions.  Cristi Loron, am  acting as transcriptionist for Illa Level, PA-C  I have reviewed the above documentation for accuracy and completeness, and I agree with the above. -Illa Level, PA-C  I have reviewed the above note and agree with the plan. -Quillian Quince, MD   OBESITY BEHAVIORAL INTERVENTION VISIT  Today's visit was # 13 out of 22.  Starting weight: 173 lbs Starting date: 05/20/16 Today's weight : 159 lbs Today's date: 11/25/2016 Total lbs lost to date: 14 (Patients must lose 7 lbs in the first 6 months to continue with counseling)   ASK: We discussed the diagnosis of obesity with [name] today and Yazleemar agreed to give [place] permission to discuss obesity behavioral modification therapy today.  ASSESS: Dawn [name] has the diagnosis of obesity and her BMI today is 28.17 Dawn [name] is in the action stage of change   ADVISE: Dawn [name] was educated on the multiple health risks of obesity as well as the benefit of weight loss to improve her health. She was advised of the need for long term treatment and the importance of lifestyle modifications.  AGREE: Multiple dietary modification options and treatment options were discussed and  Dawn [name] agreed to follow the Category 1 plan We discussed the following Behavioral Modification Strategies today: increasing lean protein intake and work on meal planning and easy cooking plans

## 2016-11-26 MED FILL — VIT D2 1.25 MG (50,000 UNIT: 1.25 MG | 28 days supply | Qty: 4 | Fill #0

## 2016-11-26 MED FILL — BUPROPION HCL SR 200 MG TAB: 200 | 30 days supply | Qty: 30 | Fill #0

## 2016-12-10 DIAGNOSIS — Z1211 Encounter for screening for malignant neoplasm of colon: Secondary | ICD-10-CM | POA: Diagnosis not present

## 2016-12-23 ENCOUNTER — Ambulatory Visit (INDEPENDENT_AMBULATORY_CARE_PROVIDER_SITE_OTHER): Payer: 59 | Admitting: Physician Assistant

## 2016-12-23 ENCOUNTER — Encounter (INDEPENDENT_AMBULATORY_CARE_PROVIDER_SITE_OTHER): Payer: Self-pay

## 2017-01-07 MED FILL — SUPREP BOWEL PREP KIT: 17.5-3.13-1 | 1 days supply | Qty: 354 | Fill #0

## 2017-02-13 DIAGNOSIS — M21611 Bunion of right foot: Secondary | ICD-10-CM | POA: Diagnosis not present

## 2017-02-13 DIAGNOSIS — M21612 Bunion of left foot: Secondary | ICD-10-CM | POA: Diagnosis not present

## 2017-02-20 ENCOUNTER — Encounter: Payer: Self-pay | Admitting: Sports Medicine

## 2017-02-20 ENCOUNTER — Ambulatory Visit (INDEPENDENT_AMBULATORY_CARE_PROVIDER_SITE_OTHER): Payer: 59 | Admitting: Sports Medicine

## 2017-02-20 VITALS — BP 134/68 | HR 65 | Ht 65.0 in | Wt 172.6 lb

## 2017-02-20 DIAGNOSIS — E669 Obesity, unspecified: Secondary | ICD-10-CM

## 2017-02-20 DIAGNOSIS — G5701 Lesion of sciatic nerve, right lower limb: Secondary | ICD-10-CM | POA: Diagnosis not present

## 2017-02-20 DIAGNOSIS — M9903 Segmental and somatic dysfunction of lumbar region: Secondary | ICD-10-CM

## 2017-02-20 DIAGNOSIS — M9904 Segmental and somatic dysfunction of sacral region: Secondary | ICD-10-CM

## 2017-02-20 DIAGNOSIS — M9905 Segmental and somatic dysfunction of pelvic region: Secondary | ICD-10-CM | POA: Diagnosis not present

## 2017-02-20 NOTE — Patient Instructions (Addendum)
It is okay to continue taking the Aleve for the next several days if needed but I anticipate you feeling better after what we did today and with starting his exercises as below.  If you have any persistent symptoms after 2 weeks and always happy to see her back and we can also consider referral to physical therapy for dry needling.  Also check out [employer]"Foundation Training" which is a program developed by Dr. Myles LippsEric Goodman.   There are links to a couple of his YouTube Videos below and I would like to see performing one of his videos 5-6 days per week.    A good intro video is: "Independence from Pain 7-minute Video" - [email or web address]   His more advanced video is: "Powerful Posture and Pain Relief: 12 minutes of Foundation Training" - https://youtu.be/[email or web address]  Do not try to attempt this entire video when first beginning.    Try breaking of each exercise that he goes into shorter segments.  Otherwise if they perform an exercise for 45 seconds, start with 15 seconds and rest and then resume when they begin the new activity.    If you work your way up to doing this 12 minute video, I expect you will see significant improvements in your pain.  If you enjoy his videos and would like to find out more you can look on his website: [email or web address].  He has a workout streaming option as well as a DVD set available for purchase.  Amazon has the best price for his DVDs.     Please perform the exercise program that we have prepared for you and gone over in detail on a daily basis.  In addition to the handout you were provided you can access your program through: [email or web address]   Your unique program code is: [email or web address]

## 2017-02-20 NOTE — Procedures (Signed)
PROCEDURE NOTE : OSTEOPATHIC MANIPULATION The decision today to treat with Osteopathic Manipulative Therapy (OMT) was based on physical exam findings. Verbal consent was obtained after after explanation of risks, benefits and potential side effects, including acute pain flare, post manipulation soreness and need for repeat treatments.   Additional time was spent discussing the minimal risk of  injury to neurovascular structures for associated Cervical manipulation.  After verbal consent was obtained manipulation was performed as below:  Contraindications to OMT reviewed and include: NONE.             Regions treated: Per examined regions as below and associated billing codes          Techniques used: Muscle Energy, MFR, HVLA and ART The patient tolerated the treatment well and reported Improved symptoms following treatment today. Patient was given medications, exercises, stretches and lifestyle modifications per AVS and verbally.     OSTEOPATHIC/STRUCTURAL EXAM FINDINGS:    Right anterior innominate  Left on left sacral torsion  L4 FRS right

## 2017-02-20 NOTE — Progress Notes (Signed)
Dawn FellsMichael D. Dawn Shinerigby, DO  [facility] [place] [phone]  Dawn SchmidConstance M [name] - 51 y.o. female MRN [id number]  Date of birth: 02/20/1966  Visit Date: 02/20/2017  PCP: Dawn [name], Ronald, MD   Referred by: Dawn [name], Ronald, MD   Scribe for today's visit: Dawn PierceMolly A [name] PT, LAT, ATC  SUBJECTIVE:  Dawn Schmidonstance M [name] is here for Follow-up (R hip pain and piriformis syndrome) .   Her R hip and glute pain symptoms INITIALLY: Began a few nights ago with no specific MOI.  Has been dealing w/ similar symptoms for about a year but it recently reoccurred. Described as 6/10 aching pain but can increase to an 8/10 , radiating to R thigh and occasionally to the R calf. Worsened with unknown Improved with dry needling previously Additional associated symptoms include: no N/T into the R LE    At this time symptoms are worsening compared to onset w/ increased pain She has been taking Naproxen, pain patches and some heat.   ROS Reports night time disturbances. Denies fevers, chills, or night sweats. Denies unexplained weight loss. Denies personal history of cancer. Denies changes in bowel or bladder habits. Denies recent unreported falls. Denies new or worsening dyspnea or wheezing. Denies headaches or dizziness.  Denies numbness, tingling or weakness  In the extremities.  Denies dizziness or presyncopal episodes Reports lower extremity edema in the R LE.   HISTORY & PERTINENT PRIOR DATA:  Prior History reviewed and updated per electronic medical record.  Significant history, findings, studies and interim changes include:  reports that  has never smoked. she has never used smokeless tobacco. Recent Labs    05/20/16 0954  HGBA1C 4.9   No specialty comments available. No problems updated.   OBJECTIVE:  VS:  HT:5\' 5"  (165.1 cm)   WT:172 lb 9.6 oz (78.3 kg)  BMI:28.72    BP:134/68  HR:65bpm  TEMP: ( )  RESP:99 %   PHYSICAL  EXAM: Constitutional: WDWN, Non-toxic appearing. Psychiatric: Alert & appropriately interactive. Not depressed or anxious appearing. Respiratory: No increased work of breathing. Trachea Midline Eyes: Pupils are equal. EOM intact without nystagmus. No scleral icterus Cardiovascular:  Peripheral Pulses: peripheral pulses symmetrical No clubbing or cyanosis appreciated Capillary Refill is normal, less than 2 seconds No signficant generalized edema/anasarca Sensory Exam: intact to light touch  Back & Lower Extremities:  Bilateral negative straight leg raise.  No significant midline tenderness.    Right gluteal musculature and right low back.  Good internal and external rotation of the hips.  Patient is able to heel and toe walk without significant difficulty.  Manual muscle testing is 5+/5 in BLE myotomes without focality  Lower extremity DTRs 2+/4 diffusely and symmetric  No additional findings.   ASSESSMENT & PLAN:   1. Piriformis syndrome of right side   2. Class 1 obesity without serious comorbidity in adult, unspecified BMI, unspecified obesity type   3. Segmental and somatic dysfunction of lumbar region   4. Segmental and somatic dysfunction of sacral region   5. Segmental and somatic dysfunction of pelvic region    PLAN: Osteopathic manipulation performed today per procedure note.  Therapeutic exercises reviewed in detail as below.  We will plan to see how she does with this and follow-up as needed.  Overall no red flag symptoms on her exam or per history and we will anticipate good improvement improved core strengthening.  If any lack of improvement or desire to repeat manipulation she will plan  to call us.  PROCEDURE NOTE: THERAPEUTIC EXERCISES ([id number]) 15 minutes spent for Therapeutic exercises as below and as referenced in the AVS. This included exercises focusing on stretching, strengthening, with significant focus on eccentric aspects.  Proper technique shown and  discussed handout in great detail with ATC. All questions were discussed and answered.   Long term goals include an improvement in range of motion, strength, endurance as well as avoiding reinjury. Frequency of visits is one time as determined during today's  office visit. Frequency of exercises to be performed is as per handout.  EXERCISES REVIEWED:  Core conditioning including quadruped   hip flexor stretching   ++++++++++++++++++++++++++++++++++++++++++++ Orders & Meds: Orders Placed This Encounter  Procedures  . OSTEOPATHIC MANIPULATION TREATMENT    No orders of the defined types were placed in this encounter.   ++++++++++++++++++++++++++++++++++++++++++++ Follow-up: Return if symptoms worsen or fail to improve.   Pertinent documentation may be included in additional procedure notes, imaging studies, problem based documentation and patient instructions. Please see these sections of the encounter for additional information regarding this visit. CMA/ATC served as Neurosurgeonscribe during this visit. History, Physical, and Plan performed by medical provider. Documentation and orders reviewed and attested to.      Dawn MewsMichael D Cyril Woodmansee, DO    [facility]

## 2017-02-25 ENCOUNTER — Encounter: Payer: Self-pay | Admitting: Sports Medicine

## 2017-03-03 MED FILL — ESTRADIOL 1 MG TAB: 1 | 90 days supply | Qty: 90 | Fill #1

## 2017-03-30 ENCOUNTER — Ambulatory Visit: Payer: Self-pay | Admitting: Sports Medicine

## 2017-03-31 ENCOUNTER — Ambulatory Visit: Payer: Self-pay | Admitting: Sports Medicine

## 2017-04-01 ENCOUNTER — Encounter: Payer: Self-pay | Admitting: Sports Medicine

## 2017-04-19 ENCOUNTER — Emergency Department (HOSPITAL_BASED_OUTPATIENT_CLINIC_OR_DEPARTMENT_OTHER)
Admission: EM | Admit: 2017-04-19 | Discharge: 2017-04-20 | Disposition: A | Discharge status: Home / Self Care | Payer: No Typology Code available for payment source | Attending: Emergency Medicine | Admitting: Emergency Medicine

## 2017-04-19 ENCOUNTER — Encounter (HOSPITAL_BASED_OUTPATIENT_CLINIC_OR_DEPARTMENT_OTHER): Payer: Self-pay | Admitting: *Deleted

## 2017-04-19 ENCOUNTER — Other Ambulatory Visit: Payer: Self-pay

## 2017-04-19 DIAGNOSIS — J45909 Unspecified asthma, uncomplicated: Secondary | ICD-10-CM | POA: Diagnosis not present

## 2017-04-19 DIAGNOSIS — T7840XA Allergy, unspecified, initial encounter: Secondary | ICD-10-CM | POA: Diagnosis not present

## 2017-04-19 DIAGNOSIS — R232 Flushing: Secondary | ICD-10-CM | POA: Diagnosis not present

## 2017-04-19 DIAGNOSIS — R202 Paresthesia of skin: Secondary | ICD-10-CM | POA: Diagnosis not present

## 2017-04-19 DIAGNOSIS — Z91040 Latex allergy status: Secondary | ICD-10-CM | POA: Insufficient documentation

## 2017-04-19 DIAGNOSIS — Z79899 Other long term (current) drug therapy: Secondary | ICD-10-CM | POA: Insufficient documentation

## 2017-04-19 DIAGNOSIS — Z96651 Presence of right artificial knee joint: Secondary | ICD-10-CM | POA: Diagnosis not present

## 2017-04-19 MED ORDER — METHYLPREDNISOLONE SODIUM SUCC 125 MG IJ SOLR
125.0000 mg | Freq: Once | INTRAMUSCULAR | Status: AC
  Administered 2017-04-19: 125 mg via INTRAVENOUS
  Filled 2017-04-19: qty 2

## 2017-04-19 MED ORDER — ONDANSETRON HCL 4 MG/2ML IJ SOLN
INTRAMUSCULAR | Status: AC
  Administered 2017-04-19: 4 mg via INTRAVENOUS
  Filled 2017-04-19: qty 2

## 2017-04-19 MED ORDER — ONDANSETRON HCL 4 MG/2ML IJ SOLN
4.0000 mg | Freq: Once | INTRAMUSCULAR | Status: AC
  Administered 2017-04-19: 4 mg via INTRAVENOUS

## 2017-04-19 MED ORDER — FAMOTIDINE IN NACL 20-0.9 MG/50ML-% IV SOLN
20.0000 mg | Freq: Once | INTRAVENOUS | Status: AC
  Administered 2017-04-19: 20 mg via INTRAVENOUS
  Filled 2017-04-19: qty 50

## 2017-04-19 NOTE — ED Notes (Signed)
[place], NAD, interactive, resps e/u, states, "itching not bad", VSS.

## 2017-04-19 NOTE — ED Triage Notes (Signed)
Pt reports unknowingly eating sauce with coconut this evening. States she felt like she was going to pass out. Pt used epi- pen and took 2 benadryl plus a sip of children's benadryl prior to arrival. States she felt swelling in her tongue but that is resolving at this time.

## 2017-04-19 NOTE — ED Provider Notes (Signed)
[facility] EMERGENCY DEPARTMENT Provider Note   CSN: [id number] Arrival date & time: 04/19/17  1922     History   Chief Complaint Chief Complaint  Patient presents with  . Allergic Reaction    HPI Dawn [name] is a 52 y.o. female.  HPI 52 year old African-American female presents to the ED for allergic reaction.  Patient states that this evening about 730 she was making dinner and was eating curry sauce.  She states that she started feeling very flushed.  She felt she was going to pass out.  She reports some tingling in her lips and to like her tongue was swelling.  Patient denies any associated difficulties breathing or swallowing.  Denies any pruritic rash.  Denies any vomiting, syncope.  Patient did use her EpiPen.  She also took 2-25 mg Benadryl capsules.  Patient states that her symptoms are resolving at this time.  Denies any associated shortness of breath, chest pain at this time.  Pt denies any fever, chill, ha, vision changes,  congestion, neck pain, cp, sob, cough, abd pain, n/v/d, urinary symptoms, change in bowel habits, melena, hematochezia, lower extremity paresthesias.  Past Medical History:  Diagnosis Date  . Acute meniscal tear of knee LEFT  . Arthritis   . Asthma   . Constipation   . Contact lens/glasses fitting    wears contacts or glasses  . Environmental allergies   . Gallbladder problem   . History of gastric ulcer AS TEEN  . History of stomach ulcers   . History of viral pericarditis PROBABLE OR IDIOPATHIC PER D/C SUMMARY MARCH 2011   NO PROBLEMS SINCE  . Joint pain   . Lactose intolerance   . Multiple food allergies    coconut  . Osteoarthritis   . Pericarditis   . Primary localized osteoarthritis of right knee 11/25/2011   S/P Left Knee arthroscopy performed by Dr. Charlann Boxerlin in April 2013.   . Seasonal allergies   . Torn rotator cuff     Patient Active Problem List   Diagnosis Date Noted  . Class 1 obesity without serious  comorbidity with body mass index (BMI) of 30.0 to 30.9 in adult 09/29/2016  . Hyperlipidemia 09/01/2016  . Vitamin D deficiency 08/05/2016  . Depression 07/22/2016  . Class 1 obesity without serious comorbidity in adult 06/16/2016  . Partial tear of left rotator cuff 04/29/2016  . Piriformis syndrome of right side 02/28/2015  . Nonallopathic lesion of lumbosacral region 02/28/2015  . Nonallopathic lesion of sacral region 02/28/2015  . Nonallopathic lesion of thoracolumbar region 02/28/2015  . Right knee pain 08/23/2013  . Left knee pain 06/02/2013  . Migraine 03/16/2012  . Obesity (BMI 30.0-34.9) 11/25/2011  . Allergic rhinitis 11/25/2011  . Primary localized osteoarthritis of right knee 11/25/2011  . History of viral pericarditis 11/25/2011    Past Surgical History:  Procedure Laterality Date  . APPENDECTOMY  1998  . BILATERAL CARPAL TUNNEL RELEASE  1980'S  . BREAST REDUCTION SURGERY Bilateral 12/21/2012   Procedure: BILATERAL MAMMARY REDUCTION  (BREAST);  Surgeon: Etter Sjogrenavid Bowers, MD;  Location: [facility];  Service: Plastics;  Laterality: Bilateral;  . CHOLECYSTECTOMY  1992  . KNEE ARTHROSCOPY  06/20/2011   Procedure: ARTHROSCOPY KNEE;  Surgeon: Shelda PalMatthew D Olin, MD;  [facility];  Service: Orthopedics;  Laterality: Left;  left knee scope   latex allergy patient blisters and swells  . PARTIAL KNEE ARTHROPLASTY Right 03/03/2014   Procedure: RIGHT UNICOMPARTMENTAL KNEE ARTHROPLASTY;  Surgeon:  Eulas Post, MD;  Location: [facility];  Service: Orthopedics;  Laterality: Right;  . TRANSTHORACIC ECHOCARDIOGRAM  04-26-2009   NORMAL LVSF/ EF 60-65% / LVDF NORMAL / NO PERICARDIAL EFFUSION  . VAGINAL HYSTERECTOMY  03-18-1999    OB History    Gravida Para Term Preterm AB Living   2 2 2     2    SAB TAB Ectopic Multiple Live Births                   Home Medications    Prior to Admission medications   Medication Sig Start Date  End Date Taking? Authorizing Provider  albuterol (PROVENTIL HFA;VENTOLIN HFA) 108 (90 BASE) MCG/ACT inhaler Inhale 2 puffs into the lungs every 6 (six) hours as needed for wheezing.   Yes [provider]  b complex vitamins tablet Take 1 tablet by mouth daily.   Yes [provider]  Biotin 5000 MCG CAPS Take by mouth every morning.   Yes [provider]  docusate sodium (COLACE) 100 MG capsule Take 1 capsule (100 mg total) by mouth 2 (two) times daily. 06/16/16  Yes Beasley, Caren D, MD  EPINEPHrine 0.3 mg/0.3 mL IJ SOAJ injection Inject into the muscle once.   Yes [provider]  levocetirizine (XYZAL) 5 MG tablet Take 5 mg by mouth every evening.   Yes [provider]  Multiple Vitamins-Minerals (MULTIVITAMIN WITH MINERALS) tablet Take by mouth daily.    Yes [provider]  omega-3 acid ethyl esters (LOVAZA) 1 g capsule Take by mouth every morning.   Yes [provider]    Family History Family History  Problem Relation Age of Onset  . Cancer Brother   . Asthma Son     Social History Social History   Tobacco Use  . Smoking status: Never Smoker  . Smokeless tobacco: Never Used  Substance Use Topics  . Alcohol use: No  . Drug use: No     Allergies   Coconut oil; Latex; and Sulfa antibiotics   Review of Systems Review of Systems  Constitutional: Negative for chills and fever.  HENT: Negative for congestion.   Respiratory: Negative for cough, chest tightness, shortness of breath and wheezing.   Cardiovascular: Negative for chest pain.  Gastrointestinal: Negative for abdominal pain, nausea and vomiting.  Skin: Negative for rash.  Neurological: Positive for dizziness, weakness and light-headedness. Negative for headaches.     Physical Exam Updated Vital Signs BP 130/73   Pulse 69   Temp 98.4 F (36.9 C)   Resp 19   Ht 5\' 4"  (1.626 m)   Wt 73.5 kg (162 lb)   SpO2 100%   BMI 27.81 kg/m   Physical Exam   Constitutional: She is oriented to person, place, and time. She appears well-developed and well-nourished.  Non-toxic appearance. No distress.  HENT:  Head: Normocephalic and atraumatic.  Nose: Nose normal.  Mouth/Throat: Oropharynx is clear and moist.  Oropharynx is clear.  No angioedema noted.  Tolerating secretions and maintaining airway.  Speaking in complete sentences.  Eyes: Conjunctivae are normal. Pupils are equal, round, and reactive to light. Right eye exhibits no discharge. Left eye exhibits no discharge.  Neck: Normal range of motion. Neck supple.  Cardiovascular: Normal rate, regular rhythm, normal heart sounds and intact distal pulses. Exam reveals no gallop and no friction rub.  No murmur heard. Pulmonary/Chest: Effort normal and breath sounds normal. No stridor. No respiratory distress. She has no wheezes. She has  no rales. She exhibits no tenderness.  Musculoskeletal: Normal range of motion. She exhibits no tenderness.  Lymphadenopathy:    She has no cervical adenopathy.  Neurological: She is alert and oriented to person, place, and time.  Skin: Skin is warm and dry. Capillary refill takes less than 2 seconds. No rash noted.  No rashes noted.  Psychiatric: Her behavior is normal. Judgment and thought content normal.  Nursing note and vitals reviewed.    ED Treatments / Results  Labs (all labs ordered are listed, but only abnormal results are displayed) Labs Reviewed - No data to display  EKG  EKG Interpretation None       Radiology No results found.  Procedures Procedures (including critical care time)  Medications Ordered in ED Medications  methylPREDNISolone sodium succinate (SOLU-MEDROL) 125 mg/2 mL injection 125 mg (125 mg Intravenous Given 04/19/17 2123)  famotidine (PEPCID) IVPB 20 mg premix (0 mg Intravenous Stopped 04/19/17 2138)  ondansetron (ZOFRAN) injection 4 mg (4 mg Intravenous Given 04/19/17 2123)     Initial Impression / Assessment and  Plan / ED Course  I have reviewed the triage vital signs and the nursing notes.  Pertinent labs & imaging results that were available during my care of the patient were reviewed by me and considered in my medical decision making (see chart for details).     Patient re-evaluated prior to dc, is hemodynamically stable, in no respiratory distress, and denies the feeling of throat closing.  She was evaluated for 5-6 hours after be injection.  She remains hemodynamically stable. Pt has been advised to take OTC benadryl & return to the ED if they have a mod-severe allergic rxn (s/s including throat closing, difficulty breathing, swelling of lips face or tongue).  Pt is hemodynamically stable, in NAD, & able to ambulate in the ED. Evaluation does not show pathology that would require ongoing emergent intervention or inpatient treatment. I explained the diagnosis to the patient. Pain has been managed & has no complaints prior to dc. Pt is comfortable with above plan and is stable for discharge at this time. All questions were answered prior to disposition. Strict return precautions for f/u to the ED were discussed. Encouraged follow up with PCP.    Final Clinical Impressions(s) / ED Diagnoses   Final diagnoses:  Allergic reaction, initial encounter    ED Discharge Orders        Ordered    predniSONE (DELTASONE) 20 MG tablet  Daily with breakfast     04/20/17 0008    EPINEPHrine 0.3 mg/0.3 mL IJ SOAJ injection   Once     04/20/17 0008       Wallace Keller 04/20/17 0317    Terrilee Files, MD 04/20/17 [phone]

## 2017-04-19 NOTE — ED Notes (Signed)
Up to b/r, slow cautious steady gait with family. States, "feel like I could go home, feel better", denies itching or other sx. VSS.

## 2017-04-19 NOTE — ED Notes (Signed)
Resting/ sleeping, NAD, calm, no dyspnea, VSS.

## 2017-04-19 NOTE — ED Notes (Addendum)
Alert, NAD, calm, interactive, resps e/u, speaking in clear complete sentences, no dyspnea noted, skin W&D, VSS, "symptoms resolving", (denies: pain, sob, wheezing, itching, rash, swelling, nausea, dizziness or visual changes). LS CTA. No dysphagia, dysphonia or dyspnea noted. [facility]  Epi pen used x1 and benadryl taken at ~1905

## 2017-04-20 MED ORDER — EPINEPHRINE 0.3 MG/0.3ML IJ SOAJ: 0.3000 mg | Freq: Once | INTRAMUSCULAR | 0 refills | Status: AC

## 2017-04-20 MED ORDER — PREDNISONE 20 MG PO TABS: 40.0000 mg | ORAL_TABLET | Freq: Every day | ORAL | 0 refills | Status: DC

## 2017-04-20 MED FILL — predniSONE 20 MG TABS: 20 | 3 days supply | Qty: 6 | Fill #0

## 2017-04-20 MED FILL — EPINEPHRINE 0.3 MG AUTO-INJ: 0.3 | 30 days supply | Qty: 2 | Fill #0

## 2017-04-20 NOTE — Discharge Instructions (Signed)
Please continue taking the prednisone starting tomorrow for the next 3 days.  I would also take Benadryl and Pepcid around-the-clock.  Have given you a refill of her EpiPen.  You develop worsening symptoms use the EpiPen if needed.  Follow-up with your primary care doctor return the ED with any worsening symptoms.

## 2017-05-01 MED FILL — AMOXICILLIN 875 MG TABLET: 875 | 10 days supply | Qty: 20 | Fill #0

## 2017-07-13 ENCOUNTER — Ambulatory Visit (INDEPENDENT_AMBULATORY_CARE_PROVIDER_SITE_OTHER): Payer: Self-pay | Admitting: Family Medicine

## 2017-07-13 VITALS — BP 128/72 | HR 92 | Temp 99.0°F | Resp 17 | Wt 175.6 lb

## 2017-07-13 DIAGNOSIS — J029 Acute pharyngitis, unspecified: Secondary | ICD-10-CM

## 2017-07-13 NOTE — Progress Notes (Signed)
Dawn [name] is a 52 y.o. female who presents today with concerns of sore throat since this morning. Patient reports that it is severe and accompanied by neck pain. She has taken Ibuprofen for symptoms up to this point and denies any known sick contacts or reason for this condition.  Review of Systems  Constitutional: Negative for chills, fever and malaise/fatigue.  HENT: Positive for sore throat. Negative for congestion, ear discharge, ear pain and sinus pain.   Eyes: Negative.   Respiratory: Negative for cough, sputum production, shortness of breath and wheezing.   Cardiovascular: Negative.  Negative for chest pain.  Gastrointestinal: Negative for abdominal pain, diarrhea, nausea and vomiting.  Genitourinary: Negative for dysuria, frequency, hematuria and urgency.  Musculoskeletal: Positive for neck pain. Negative for myalgias.  Skin: Negative.  Negative for rash.  Neurological: Negative for headaches.  Endo/Heme/Allergies: Negative.   Psychiatric/Behavioral: Negative.     O: Vitals:   07/13/17 1316  BP: 128/72  Pulse: 92  Resp: 17  Temp: 99 F (37.2 C)  SpO2: 97%     Physical Exam  Constitutional: She is oriented to person, place, and time. Vital signs are normal. She appears well-developed and well-nourished. She is active.  Non-toxic appearance. She does not have a sickly appearance.  HENT:  Head: Normocephalic.  Right Ear: Hearing, tympanic membrane, external ear and ear canal normal.  Left Ear: Hearing, tympanic membrane, external ear and ear canal normal.  Nose: Nose normal.  Mouth/Throat: Uvula is midline. Posterior oropharyngeal edema and posterior oropharyngeal erythema present. No oropharyngeal exudate or tonsillar abscesses. Tonsils are 2+ on the right. Tonsils are 2+ on the left. No tonsillar exudate.  Neck: Normal range of motion. Neck supple.  Cardiovascular: Normal rate, regular rhythm, normal heart sounds and normal pulses.  Pulmonary/Chest: Effort  normal and breath sounds normal.  Abdominal: Soft. Bowel sounds are normal.  Musculoskeletal: Normal range of motion.  Lymphadenopathy:       Head (right side): No submental and no submandibular adenopathy present.       Head (left side): No submental and no submandibular adenopathy present.    She has no cervical adenopathy.  Neurological: She is alert and oriented to person, place, and time.  Psychiatric: She has a normal mood and affect.  Vitals reviewed.    A: 1. Sore throat     P: Suspect viral etiology advised patient to trial supportive care measures discussed and f/u if symptoms change or become worse.  Exam findings, diagnosis etiology and medication use and indications reviewed with patient. Follow- Up and discharge instructions provided. No emergent/urgent issues found on exam.  Patient verbalized understanding of information provided and agrees with plan of care (POC), all questions answered.  1. Sore throat PLAN> Warm salt water rinses Change toothbursh Motrin 800 mg every 8 hours with food Cepacol throat lozenges F/U if work note needed

## 2017-07-13 NOTE — Patient Instructions (Signed)
PLAN> Warm salt water rinses Change toothbursh Motrin 800 mg every 8 hours with food Cepacol throat lozenges F/U if work note needed  Sore Throat When you have a sore throat, your throat may:  Hurt.  Burn.  Feel irritated.  Feel scratchy.  Many things can cause a sore throat, including:  An infection.  Allergies.  Dryness in the air.  Smoke or pollution.  Gastroesophageal reflux disease (GERD).  A tumor.  A sore throat can be the first sign of another sickness. It can happen with other problems, like coughing or a fever. Most sore throats go away without treatment. Follow these instructions at home:  Take over-the-counter medicines only as told by your doctor.  Drink enough fluids to keep your pee (urine) clear or pale yellow.  Rest when you feel you need to.  To help with pain, try: ? Sipping warm liquids, such as broth, herbal tea, or warm water. ? Eating or drinking cold or frozen liquids, such as frozen ice pops. ? Gargling with a salt-water mixture 3-4 times a day or as needed. To make a salt-water mixture, add -1 tsp of salt in 1 cup of warm water. Mix it until you cannot see the salt anymore. ? Sucking on hard candy or throat lozenges. ? Putting a cool-mist humidifier in your bedroom at night. ? Sitting in the bathroom with the door closed for 5-10 minutes while you run hot water in the shower.  Do not use any tobacco products, such as cigarettes, chewing tobacco, and e-cigarettes. If you need help quitting, ask your doctor. Contact a doctor if:  You have a fever for more than 2-3 days.  You keep having symptoms for more than 2-3 days.  Your throat does not get better in 7 days.  You have a fever and your symptoms suddenly get worse. Get help right away if:  You have trouble breathing.  You cannot swallow fluids, soft foods, or your saliva.  You have swelling in your throat or neck that gets worse.  You keep feeling like you are going to  throw up (vomit).  You keep throwing up. This information is not intended to replace advice given to you by your health care provider. Make sure you discuss any questions you have with your health care provider. Document Released: 11/20/2007 Document Revised: 10/07/2015 Document Reviewed: 12/01/2014 Elsevier Interactive Patient Education  [employer].

## 2017-07-15 ENCOUNTER — Ambulatory Visit (INDEPENDENT_AMBULATORY_CARE_PROVIDER_SITE_OTHER): Payer: Self-pay | Admitting: Nurse Practitioner

## 2017-07-15 ENCOUNTER — Encounter: Payer: Self-pay | Admitting: Nurse Practitioner

## 2017-07-15 VITALS — BP 100/62 | HR 83 | Temp 99.1°F | Wt 175.0 lb

## 2017-07-15 DIAGNOSIS — J029 Acute pharyngitis, unspecified: Secondary | ICD-10-CM

## 2017-07-15 DIAGNOSIS — J0140 Acute pansinusitis, unspecified: Secondary | ICD-10-CM

## 2017-07-15 DIAGNOSIS — R6889 Other general symptoms and signs: Secondary | ICD-10-CM

## 2017-07-15 LAB — POCT INFLUENZA A/B
INFLUENZA B, POC: NEGATIVE
Influenza A, POC: NEGATIVE

## 2017-07-15 LAB — POCT RAPID STREP A (OFFICE): Rapid Strep A Screen: NEGATIVE

## 2017-07-15 MED ORDER — MAGIC MOUTHWASH W/LIDOCAINE: 5.0000 mL | Freq: Three times a day (TID) | ORAL | 0 refills | Status: AC | PRN

## 2017-07-15 MED ORDER — AMOXICILLIN-POT CLAVULANATE 875-125 MG PO TABS: 1.0000 | ORAL_TABLET | Freq: Two times a day (BID) | ORAL | 0 refills | Status: AC

## 2017-07-15 MED FILL — CMPD MMW LID:NYS:DPH:MAX: 7 days supply | Qty: 110 | Fill #0

## 2017-07-15 MED FILL — AMOX TR-K CLV 875-125 MG TA: 875-125 | 10 days supply | Qty: 20 | Fill #0

## 2017-07-15 NOTE — Progress Notes (Signed)
Subjective:  Dawn [name] is a 52 y.o. female who presents for evaluation of possible sinusitis.  Symptoms include facial pain, fever: suspected fevers but not measured at home, headache described as dull, pain while swallowing, post nasal drip, productive cough with yellow colored sputum, sinus pressure, sinus pain, swollen glands and right ear pressure.  Onset of symptoms was 4 days ago, and has been rapidly worsening since that time.  Patient was seen on Monday, 07/13/2017, the same days her symptoms started.  Patient was not given any medication at that time as it was too early to treat with antibiotics.  Treatment to date:  antihistamines, ibuprofen and warm saltwater gargles.  High risk factors for influenza complications:  none.  The following portions of the patient's history were reviewed and updated as appropriate:  allergies, current medications and past medical history.  Constitutional: positive for anorexia, chills, fatigue and fevers, negative for malaise and sweats Eyes: negative Ears, nose, mouth, throat, and face: positive for sore throat and right ear fullness/pressure, negative for ear drainage, hoarseness and nasal congestion Respiratory: positive for cough, negative for asthma, chronic bronchitis, sputum, stridor and wheezing Cardiovascular: negative Gastrointestinal: positive for decreased appetite, negative for abdominal pain, diarrhea, nausea and vomiting Neurological: positive for headaches, negative for coordination problems, gait problems, speech problems, tremors, vertigo and weakness Allergic/Immunologic: positive for hay fever Objective:  BP 100/62   Pulse 83   Temp 99.1 F (37.3 C)   Wt 175 lb (79.4 kg)   SpO2 95%   BMI 30.04 kg/m  General appearance: alert, cooperative, fatigued and no distress Head: Normocephalic, without obvious abnormality, atraumatic Eyes: conjunctivae/corneas clear. PERRL, EOM's intact. Fundi benign. Ears: abnormal TM right ear -  mucoid middle ear fluid Nose: no discharge, turbinates swollen, inflamed, moderate maxillary sinus tenderness right, moderate frontal sinus tenderness right Throat: abnormal findings: moderate oropharyngeal erythema, +2 tonsillar edema, uvula midline, no exudates,  + postnasal drip Lungs: clear to auscultation bilaterally Heart: regular rate and rhythm, S1, S2 normal, no murmur, click, rub or gallop Abdomen: soft, non-tender; bowel sounds normal; no masses,  no organomegaly Pulses: 2+ and symmetric Skin: Skin color, texture, turgor normal. No rashes or lesions Lymph nodes: bilateral cervical lymphadenopathy Neurologic: Grossly normal    Assessment:  Acute Pansinusitis and Pharyngitis    Plan:  Discussed diagnosis and treatment of sinusitis. Educational material distributed and questions answered. Suggested symptomatic OTC remedies. Supportive care with appropriate antipyretics and fluids. Augmentin per orders. Follow up as needed.  Patient instructed to continue ibuprofen 800 mg every 8 hours.  Magic mouthwash 5 meals to swish and spit up to 3 times daily for throat pain.  Patient to follow-up with PCP if symptoms do not improve.  Patient verbalizes understanding and has no questions at time of discharge. Meds ordered this encounter  Medications  . amoxicillin-clavulanate (AUGMENTIN) 875-125 MG tablet    Sig: Take 1 tablet by mouth 2 (two) times daily for 10 days.    Dispense:  20 tablet    Refill:  0    Order Specific Question:   Supervising Provider    Answer:   Stacie Glaze [[id number]]  . magic mouthwash w/lidocaine SOLN    Sig: Take 5 mLs by mouth 3 (three) times daily as needed for up to 7 days for mouth pain.    Dispense:  110 mL    Refill:  0    Benadryl, Simethicone, Nystatin and Lidocaine 1:1 ratio    Order Specific Question:  Supervising Provider    Answer:   Ricard Dillon [phone]

## 2017-07-15 NOTE — Patient Instructions (Signed)
Pharyngitis Pharyngitis is redness, pain, and swelling (inflammation) of the throat (pharynx). It is a very common cause of sore throat. Pharyngitis can be caused by a bacteria, but it is usually caused by a virus. Most cases of pharyngitis get better on their own without treatment. What are the causes? This condition may be caused by:  Infection by viruses (viral). Viral pharyngitis spreads from person to person (is contagious) through coughing, sneezing, and sharing of personal items or utensils such as cups, forks, spoons, and toothbrushes.  Infection by bacteria (bacterial). Bacterial pharyngitis may be spread by touching the nose or face after coming in contact with the bacteria, or through more intimate contact, such as kissing.  Allergies. Allergies can cause buildup of mucus in the throat (post-nasal drip), leading to inflammation and irritation. Allergies can also cause blocked nasal passages, forcing breathing through the mouth, which dries and irritates the throat.  What increases the risk? You are more likely to develop this condition if:  You are 5-24 years old.  You are exposed to crowded environments such as daycare, school, or dormitory living.  You live in a cold climate.  You have a weakened disease-fighting (immune) system.  What are the signs or symptoms? Symptoms of this condition vary by the cause (viral, bacterial, or allergies) and can include:  Sore throat.  Fatigue.  Low-grade fever.  Headache.  Joint pain and muscle aches.  Skin rashes.  Swollen glands in the throat (lymph nodes).  Plaque-like film on the throat or tonsils. This is often a symptom of bacterial pharyngitis.  Vomiting.  Stuffy nose (nasal congestion).  Cough.  Red, itchy eyes (conjunctivitis).  Loss of appetite.  How is this diagnosed? This condition is often diagnosed based on your medical history and a physical exam. Your health care provider will ask you questions about  your illness and your symptoms. A swab of your throat may be done to check for bacteria (rapid strep test). Other lab tests may also be done, depending on the suspected cause, but these are rare. How is this treated? This condition usually gets better in 3-4 days without medicine. Bacterial pharyngitis may be treated with antibiotic medicines. Follow these instructions at home:  Take over-the-counter and prescription medicines only as told by your health care provider. ? If you were prescribed an antibiotic medicine, take it as told by your health care provider. Do not stop taking the antibiotic even if you start to feel better. ? Do not give children aspirin because of the association with Reye syndrome.  Drink enough water and fluids to keep your urine clear or pale yellow.  Get a lot of rest.  Gargle with a salt-water mixture 3-4 times a day or as needed. To make a salt-water mixture, completely dissolve -1 tsp of salt in 1 cup of warm water.  If your health care provider approves, you may use throat lozenges or sprays to soothe your throat. Contact a health care provider if:  You have large, tender lumps in your neck.  You have a rash.  You cough up green, yellow-brown, or bloody spit. Get help right away if:  Your neck becomes stiff.  You drool or are unable to swallow liquids.  You cannot drink or take medicines without vomiting.  You have severe pain that does not go away, even after you take medicine.  You have trouble breathing, and it is not caused by a stuffy nose.  You have new pain and swelling in your joints   such as the knees, ankles, wrists, or elbows. Summary  Pharyngitis is redness, pain, and swelling (inflammation) of the throat (pharynx).  While pharyngitis can be caused by a bacteria, the most common causes are viral.  Most cases of pharyngitis get better on their own without treatment.  Bacterial pharyngitis is treated with antibiotic medicines. This  information is not intended to replace advice given to you by your health care provider. Make sure you discuss any questions you have with your health care provider. Document Released: 02/10/2005 Document Revised: 03/18/2016 Document Reviewed: 03/18/2016 Elsevier Interactive Patient Education  2018 [employer]  Sinusitis, Adult Sinusitis is soreness and inflammation of your sinuses. Sinuses are hollow spaces in the bones around your face. Your sinuses are located:  Around your eyes.  In the middle of your forehead.  Behind your nose.  In your cheekbones.  Your sinuses and nasal passages are lined with a stringy fluid (mucus). Mucus normally drains out of your sinuses. When your nasal tissues become inflamed or swollen, the mucus can become trapped or blocked so air cannot flow through your sinuses. This allows bacteria, viruses, and funguses to grow, which leads to infection. Sinusitis can develop quickly and last for 7?10 days (acute) or for more than 12 weeks (chronic). Sinusitis often develops after a cold. What are the causes? This condition is caused by anything that creates swelling in the sinuses or stops mucus from draining, including:  Allergies.  Asthma.  Bacterial or viral infection.  Abnormally shaped bones between the nasal passages.  Nasal growths that contain mucus (nasal polyps).  Narrow sinus openings.  Pollutants, such as chemicals or irritants in the air.  A foreign object stuck in the nose.  A fungal infection. This is rare.  What increases the risk? The following factors may make you more likely to develop this condition:  Having allergies or asthma.  Having had a recent cold or respiratory tract infection.  Having structural deformities or blockages in your nose or sinuses.  Having a weak immune system.  Doing a lot of swimming or diving.  Overusing nasal sprays.  Smoking.  What are the signs or symptoms? The main symptoms of this  condition are pain and a feeling of pressure around the affected sinuses. Other symptoms include:  Upper toothache.  Earache.  Headache.  Bad breath.  Decreased sense of smell and taste.  A cough that may get worse at night.  Fatigue.  Fever.  Thick drainage from your nose. The drainage is often green and it may contain pus (purulent).  Stuffy nose or congestion.  Postnasal drip. This is when extra mucus collects in the throat or back of the nose.  Swelling and warmth over the affected sinuses.  Sore throat.  Sensitivity to light.  How is this diagnosed? This condition is diagnosed based on symptoms, a medical history, and a physical exam. To find out if your condition is acute or chronic, your health care provider may:  Look in your nose for signs of nasal polyps.  Tap over the affected sinus to check for signs of infection.  View the inside of your sinuses using an imaging device that has a light attached (endoscope).  If your health care provider suspects that you have chronic sinusitis, you may also:  Be tested for allergies.  Have a sample of mucus taken from your nose (nasal culture) and checked for bacteria.  Have a mucus sample examined to see if your sinusitis is related to an allergy.    If your sinusitis does not respond to treatment and it lasts longer than 8 weeks, you may have an MRI or CT scan to check your sinuses. These scans also help to determine how severe your infection is. In rare cases, a bone biopsy may be done to rule out more serious types of fungal sinus disease. How is this treated? Treatment for sinusitis depends on the cause and whether your condition is chronic or acute. If a virus is causing your sinusitis, your symptoms will go away on their own within 10 days. You may be given medicines to relieve your symptoms, including:  Topical nasal decongestants. They shrink swollen nasal passages and let mucus drain from your  sinuses.  Antihistamines. These drugs block inflammation that is triggered by allergies. This can help to ease swelling in your nose and sinuses.  Topical nasal corticosteroids. These are nasal sprays that ease inflammation and swelling in your nose and sinuses.  Nasal saline washes. These rinses can help to get rid of thick mucus in your nose.  If your condition is caused by bacteria, you will be given an antibiotic medicine. If your condition is caused by a fungus, you will be given an antifungal medicine. Surgery may be needed to correct underlying conditions, such as narrow nasal passages. Surgery may also be needed to remove polyps. Follow these instructions at home: Medicines  Take, use, or apply over-the-counter and prescription medicines only as told by your health care provider. These may include nasal sprays.  If you were prescribed an antibiotic medicine, take it as told by your health care provider. Do not stop taking the antibiotic even if you start to feel better. Hydrate and Humidify  Drink enough water to keep your urine clear or pale yellow. Staying hydrated will help to thin your mucus.  Use a cool mist humidifier to keep the humidity level in your home above 50%.  Inhale steam for 10-15 minutes, 3-4 times a day or as told by your health care provider. You can do this in the bathroom while a hot shower is running.  Limit your exposure to cool or dry air. Rest  Rest as much as possible.  Sleep with your head raised (elevated).  Make sure to get enough sleep each night. General instructions  Apply a warm, moist washcloth to your face 3-4 times a day or as told by your health care provider. This will help with discomfort.  Wash your hands often with soap and water to reduce your exposure to viruses and other germs. If soap and water are not available, use hand sanitizer.  Do not smoke. Avoid being around people who are smoking (secondhand smoke).  Keep all  follow-up visits as told by your health care provider. This is important. Contact a health care provider if:  You have a fever.  Your symptoms get worse.  Your symptoms do not improve within 10 days. Get help right away if:  You have a severe headache.  You have persistent vomiting.  You have pain or swelling around your face or eyes.  You have vision problems.  You develop confusion.  Your neck is stiff.  You have trouble breathing. This information is not intended to replace advice given to you by your health care provider. Make sure you discuss any questions you have with your health care provider. Document Released: 02/10/2005 Document Revised: 10/07/2015 Document Reviewed: 12/06/2014 Elsevier Interactive Patient Education  2018 [employer] Inc.  

## 2017-07-17 ENCOUNTER — Telehealth: Payer: Self-pay

## 2017-07-17 NOTE — Telephone Encounter (Signed)
Called to follow up with pt to see how she is feeling since her visit with [place], however, pt vm is full.

## 2017-09-21 MED FILL — ESTRADIOL 1 MG TABLET: 1 | 90 days supply | Qty: 90 | Fill #0

## 2017-11-11 ENCOUNTER — Other Ambulatory Visit: Payer: Self-pay | Admitting: Obstetrics & Gynecology

## 2017-11-11 DIAGNOSIS — N6320 Unspecified lump in the left breast, unspecified quadrant: Secondary | ICD-10-CM

## 2017-11-16 ENCOUNTER — Ambulatory Visit
Admission: RE | Admit: 2017-11-16 | Discharge: 2017-11-16 | Disposition: A | Discharge status: Home / Self Care | Payer: No Typology Code available for payment source | Source: Ambulatory Visit | Attending: Obstetrics & Gynecology | Admitting: Obstetrics & Gynecology

## 2017-11-16 DIAGNOSIS — N6320 Unspecified lump in the left breast, unspecified quadrant: Secondary | ICD-10-CM

## 2018-04-28 MED FILL — EPINEPHRINE 0.3 MG AUTO-INJ: 0.3 | 2 days supply | Qty: 2 | Fill #0

## 2018-11-25 MED FILL — ESTRADIOL 1 MG TABS: 1 | 90 days supply | Qty: 90 | Fill #0

## 2018-11-25 MED FILL — FLUCONAZOLE 150 MG TABS: 150 | 6 days supply | Qty: 2 | Fill #0

## 2018-11-25 MED FILL — METRONIDAZOLE 500 MG TABS: 500 | 7 days supply | Qty: 14 | Fill #0

## 2019-03-31 MED FILL — ALPRAZolam 0.25 MG TABS: 0.25 | 30 days supply | Qty: 30 | Fill #0

## 2019-06-16 MED FILL — ESTRADIOL 2 MG TABLET: 2 | 90 days supply | Qty: 90 | Fill #0

## 2019-08-12 ENCOUNTER — Other Ambulatory Visit: Payer: Self-pay | Admitting: Obstetrics

## 2019-08-12 DIAGNOSIS — N644 Mastodynia: Secondary | ICD-10-CM

## 2019-08-26 ENCOUNTER — Ambulatory Visit
Admission: RE | Admit: 2019-08-26 | Discharge: 2019-08-26 | Disposition: A | Discharge status: Home / Self Care | Payer: No Typology Code available for payment source | Source: Ambulatory Visit | Attending: Obstetrics | Admitting: Obstetrics

## 2019-08-26 ENCOUNTER — Other Ambulatory Visit: Payer: Self-pay

## 2019-08-26 DIAGNOSIS — N644 Mastodynia: Secondary | ICD-10-CM

## 2019-10-21 MED FILL — ESTRADIOL 2 MG TABS: 2 | 90 days supply | Qty: 90 | Fill #1

## 2019-10-21 MED FILL — EPINEPHRINE 0.3 MG AUTO-INJ: 0.3 | 30 days supply | Qty: 2 | Fill #0

## 2019-12-23 ENCOUNTER — Other Ambulatory Visit (HOSPITAL_COMMUNITY): Payer: Self-pay | Admitting: Obstetrics

## 2019-12-23 MED FILL — ESTRADIOL 1 MG TABS: 1 | 90 days supply | Qty: 135 | Fill #0

## 2020-05-14 MED FILL — ESTRADIOL 1 MG TABS: 1 | 90 days supply | Qty: 135 | Fill #1

## 2021-01-22 LAB — HM MAMMOGRAPHY

## 2021-01-28 ENCOUNTER — Other Ambulatory Visit: Payer: Self-pay | Admitting: Internal Medicine

## 2021-01-28 ENCOUNTER — Ambulatory Visit
Admission: RE | Admit: 2021-01-28 | Discharge: 2021-01-28 | Disposition: A | Discharge status: Home / Self Care | Payer: 59 | Source: Ambulatory Visit | Attending: Internal Medicine | Admitting: Internal Medicine

## 2021-01-28 DIAGNOSIS — M5441 Lumbago with sciatica, right side: Secondary | ICD-10-CM

## 2021-02-06 ENCOUNTER — Other Ambulatory Visit: Payer: Self-pay | Admitting: Internal Medicine

## 2021-02-06 DIAGNOSIS — M5432 Sciatica, left side: Secondary | ICD-10-CM

## 2021-02-12 ENCOUNTER — Ambulatory Visit
Admission: RE | Admit: 2021-02-12 | Discharge: 2021-02-12 | Disposition: A | Discharge status: Home / Self Care | Payer: 59 | Source: Ambulatory Visit | Attending: Internal Medicine | Admitting: Internal Medicine

## 2021-02-12 ENCOUNTER — Other Ambulatory Visit: Payer: Self-pay

## 2021-02-12 DIAGNOSIS — M5432 Sciatica, left side: Secondary | ICD-10-CM

## 2021-02-12 MED ORDER — GADOBENATE DIMEGLUMINE 529 MG/ML IV SOLN
15.0000 mL | Freq: Once | INTRAVENOUS | Status: AC | PRN
  Administered 2021-02-12: 12:00:00 15 mL via INTRAVENOUS

## 2021-11-25 ENCOUNTER — Encounter: Payer: Self-pay | Admitting: Internal Medicine

## 2021-11-25 ENCOUNTER — Ambulatory Visit (INDEPENDENT_AMBULATORY_CARE_PROVIDER_SITE_OTHER): Payer: 59 | Admitting: Internal Medicine

## 2021-11-25 VITALS — BP 138/64 | HR 72 | Temp 98.1°F | Ht 63.0 in | Wt 165.8 lb

## 2021-11-25 DIAGNOSIS — Z0000 Encounter for general adult medical examination without abnormal findings: Secondary | ICD-10-CM

## 2021-11-25 DIAGNOSIS — Z23 Encounter for immunization: Secondary | ICD-10-CM | POA: Diagnosis not present

## 2021-11-25 DIAGNOSIS — J4520 Mild intermittent asthma, uncomplicated: Secondary | ICD-10-CM | POA: Diagnosis not present

## 2021-11-25 DIAGNOSIS — Z0001 Encounter for general adult medical examination with abnormal findings: Secondary | ICD-10-CM

## 2021-11-25 DIAGNOSIS — Z7689 Persons encountering health services in other specified circumstances: Secondary | ICD-10-CM

## 2021-11-25 DIAGNOSIS — R030 Elevated blood-pressure reading, without diagnosis of hypertension: Secondary | ICD-10-CM

## 2021-11-25 MED ORDER — ALBUTEROL SULFATE HFA 108 (90 BASE) MCG/ACT IN AERS: 2.0000 | INHALATION_SPRAY | Freq: Four times a day (QID) | RESPIRATORY_TRACT | 5 refills | Status: AC | PRN

## 2021-11-25 MED ORDER — EPINEPHRINE 0.3 MG/0.3ML IJ SOAJ: 0.3000 mg | Freq: Once | INTRAMUSCULAR | 1 refills | Status: AC

## 2021-11-25 NOTE — Progress Notes (Signed)
I,Victoria T Hamilton,acting as a scribe for Maximino Greenland, MD.,have documented all relevant documentation on the behalf of Maximino Greenland, MD,as directed by  Maximino Greenland, MD while in the presence of Maximino Greenland, MD.   Subjective:     Patient ID: Dawn [name] , female    DOB: 01-Mar-1965 , 56 y.o.   MRN: [id number]   Chief Complaint  Patient presents with   Establish Care    HPI  Patient presents today to est care. Previous pcp: Dr Seward Carol. She reports that she was not satisfied with her recent care. She was referred by Dr. Rolena Infante. She is followed by Dr. Guadalupe Maple for her GYN exams. Her last exam was in 2022. She states her last physical with Dr. Delfina Redwood was in May 2022. She is s/p partial hysterectomy. She does report h/o asthma, hayfever and allergies.  She is currently taking Zyrtec and Allegra D as needed.   She can't recall her last asthma exacerbation. Chemicals are her usual trigger. She has never required hospitalization. Her last mammogram was in Nov 2022. Her last colonoscopy was in 2019, this was performed by Dr. Benson Norway. She would like to renew her albuterol & Epi pen rx.   She works at [employer] for [employer]. She is married.    Asthma There is no difficulty breathing, frequent throat clearing, hoarse voice or shortness of breath. Her symptoms are aggravated by exposure to fumes. Her symptoms are alleviated by beta-agonist. Her past medical history is significant for asthma.     Past Medical History:  Diagnosis Date   Acute meniscal tear of knee LEFT   Arthritis    Asthma    Constipation    Contact lens/glasses fitting    wears contacts or glasses   Environmental allergies    Gallbladder problem    History of gastric ulcer AS TEEN   History of stomach ulcers    History of viral pericarditis PROBABLE OR IDIOPATHIC PER D/C SUMMARY MARCH 2011   NO PROBLEMS SINCE   Joint pain    Lactose intolerance    Multiple food allergies    coconut    Osteoarthritis    Pericarditis    Primary localized osteoarthritis of right knee 11/25/2011   S/P Left Knee arthroscopy performed by Dr. Alvan Dame in April 2013.    Seasonal allergies    Torn rotator cuff      Family History  Problem Relation Age of Onset   Cancer Brother    Asthma Son    Breast cancer Paternal Aunt      Current Outpatient Medications:    b complex vitamins tablet, Take 1 tablet by mouth daily., Disp: , Rfl:    Biotin 5000 MCG CAPS, Take by mouth every morning., Disp: , Rfl:    estradiol (ESTRACE) 2 MG tablet, Take 2 mg by mouth daily., Disp: , Rfl:    Multiple Vitamins-Minerals (MULTIVITAMIN WITH MINERALS) tablet, Take by mouth daily. , Disp: , Rfl:    albuterol (VENTOLIN HFA) 108 (90 Base) MCG/ACT inhaler, Inhale 2 puffs into the lungs every 6 (six) hours as needed for wheezing., Disp: 18 g, Rfl: 5   docusate sodium (COLACE) 100 MG capsule, Take 1 capsule (100 mg total) by mouth 2 (two) times daily. (Patient not taking: Reported on 07/13/2017), Disp: 60 capsule, Rfl: 0   estradiol (ESTRACE) 1 MG tablet, TAKE 1 AND 1/2 TABLETS BY MOUTH EVERY DAY, Disp: 140 tablet, Rfl: 3   levocetirizine (XYZAL) 5  MG tablet, Take 5 mg by mouth every evening. (Patient not taking: Reported on 11/25/2021), Disp: , Rfl:    omega-3 acid ethyl esters (LOVAZA) 1 g capsule, Take by mouth every morning. (Patient not taking: Reported on 11/25/2021), Disp: , Rfl:    predniSONE (DELTASONE) 20 MG tablet, Take 2 tablets (40 mg total) by mouth daily with breakfast. (Patient not taking: Reported on 07/13/2017), Disp: 6 tablet, Rfl: 0   Allergies  Allergen Reactions   Coconut (Cocos Nucifera) Anaphylaxis, Shortness Of Breath and Swelling    Coconut   Latex Swelling    AND BLISTERS   Sulfa Antibiotics Anaphylaxis     Review of Systems  Constitutional: Negative.   HENT: Negative.  Negative for hoarse voice.   Eyes: Negative.   Respiratory: Negative.  Negative for shortness of breath.   Cardiovascular:  Negative.   Endocrine: Negative.   Genitourinary: Negative.   Musculoskeletal: Negative.   Skin: Negative.   Allergic/Immunologic: Negative.   Neurological: Negative.   Hematological: Negative.   Psychiatric/Behavioral: Negative.       Today's Vitals   11/25/21 1407  BP: 138/64  Pulse: 72  Temp: 98.1 F (36.7 C)  SpO2: 98%  Weight: 165 lb 12.8 oz (75.2 kg)  Height: 5' 3"  (1.6 m)  PainSc: 0-No pain   Body mass index is 29.37 kg/m.  Wt Readings from Last 3 Encounters:  11/25/21 165 lb 12.8 oz (75.2 kg)  07/15/17 175 lb (79.4 kg)  07/13/17 175 lb 9.6 oz (79.7 kg)    Objective:  Physical Exam Vitals and nursing note reviewed.  Constitutional:      Appearance: Normal appearance.  HENT:     Head: Normocephalic and atraumatic.     Right Ear: Tympanic membrane, ear canal and external ear normal.     Left Ear: Tympanic membrane, ear canal and external ear normal.     Nose:     Comments: Masked     Mouth/Throat:     Comments: Masked  Eyes:     Extraocular Movements: Extraocular movements intact.     Conjunctiva/sclera: Conjunctivae normal.     Pupils: Pupils are equal, round, and reactive to light.  Cardiovascular:     Rate and Rhythm: Normal rate and regular rhythm.     Pulses: Normal pulses.     Heart sounds: Normal heart sounds.  Pulmonary:     Effort: Pulmonary effort is normal.     Breath sounds: Normal breath sounds.  Abdominal:     General: Abdomen is flat. Bowel sounds are normal.     Palpations: Abdomen is soft.  Genitourinary:    Comments: deferred Musculoskeletal:        General: Normal range of motion.     Cervical back: Normal range of motion and neck supple.  Skin:    General: Skin is warm and dry.  Neurological:     General: No focal deficit present.     Mental Status: She is alert and oriented to person, place, and time.  Psychiatric:        Mood and Affect: Mood normal.        Behavior: Behavior normal.      Assessment And Plan:     1.  Routine general medical examination at health care facility Comments: A full exam was performed. Importance of monthly self breast exams was discussed with the patient. PATIENT IS ADVISED TO GET 30-45 MINUTES REGULAR EXERCISE NO LESS THAN FOUR TO FIVE DAYS PER WEEK - BOTH WEIGHTBEARING  EXERCISES AND AEROBIC ARE RECOMMENDED.  PATIENT IS ADVISED TO FOLLOW A HEALTHY DIET WITH AT LEAST SIX FRUITS/VEGGIES PER DAY, DECREASE INTAKE OF RED MEAT, AND TO INCREASE FISH INTAKE TO TWO DAYS PER WEEK.  MEATS/FISH SHOULD NOT BE FRIED, BAKED OR BROILED IS PREFERABLE.  IT IS ALSO IMPORTANT TO CUT BACK ON YOUR SUGAR INTAKE. PLEASE AVOID ANYTHING WITH ADDED SUGAR, CORN SYRUP OR OTHER SWEETENERS. IF YOU MUST USE A SWEETENER, YOU CAN TRY STEVIA. IT IS ALSO IMPORTANT TO AVOID ARTIFICIALLY SWEETENERS AND DIET BEVERAGES. LASTLY, I SUGGEST WEARING SPF 50 SUNSCREEN ON EXPOSED PARTS AND ESPECIALLY WHEN IN THE DIRECT SUNLIGHT FOR AN EXTENDED PERIOD OF TIME.  PLEASE AVOID FAST FOOD RESTAURANTS AND INCREASE YOUR WATER INTAKE. - CMP14+EGFR - CBC - Lipid panel - Hepatitis C antibody - EKG 12-Lead  2. Mild intermittent asthma without complication Comments: Chronic, currently stable. Will refill rx as requested.   3. Elevated blood pressure reading Comments: EKG performed, NSR w/ RAE, voltage criteria for LVH, anterior infarct and neg precordial T waves. Importance of salt restriction d/w pt.   4. Immunization due - Flu Vaccine QUAD 6+ mos PF IM (Fluarix Quad PF) - Tdap vaccine greater than or equal to 7yo IM  5. Encounter to establish care   Patient was given opportunity to ask questions. Patient verbalized understanding of the plan and was able to repeat key elements of the plan. All questions were answered to their satisfaction.   I, Maximino Greenland, MD, have reviewed all documentation for this visit. The documentation on 11/25/21 for the exam, diagnosis, procedures, and orders are all accurate and complete.   IF YOU HAVE  BEEN REFERRED TO A SPECIALIST, IT MAY TAKE 1-2 WEEKS TO SCHEDULE/PROCESS THE REFERRAL. IF YOU HAVE NOT HEARD FROM US/SPECIALIST IN TWO WEEKS, PLEASE GIVE [place] A CALL AT [phone] X [phone].   THE PATIENT IS ENCOURAGED TO PRACTICE SOCIAL DISTANCING DUE TO THE COVID-19 PANDEMIC.

## 2021-11-25 NOTE — Patient Instructions (Signed)

## 2021-11-26 LAB — CBC
Hematocrit: 37 % (ref 34.0–46.6)
Hemoglobin: 12.2 g/dL (ref 11.1–15.9)
MCH: 31 pg (ref 26.6–33.0)
MCHC: 33 g/dL (ref 31.5–35.7)
MCV: 94 fL (ref 79–97)
Platelets: 334 10*3/uL (ref 150–450)
RBC: 3.93 x10E6/uL (ref 3.77–5.28)
RDW: 11.7 % (ref 11.7–15.4)
WBC: 7.7 10*3/uL (ref 3.4–10.8)

## 2021-11-26 LAB — CMP14+EGFR
ALT: 15 IU/L (ref 0–32)
AST: 21 IU/L (ref 0–40)
Albumin/Globulin Ratio: 1.4 (ref 1.2–2.2)
Albumin: 4.6 g/dL (ref 3.8–4.9)
Alkaline Phosphatase: 73 IU/L (ref 44–121)
BUN/Creatinine Ratio: 21 (ref 9–23)
BUN: 14 mg/dL (ref 6–24)
Bilirubin Total: 0.2 mg/dL (ref 0.0–1.2)
CO2: 20 mmol/L (ref 20–29)
Calcium: 9.2 mg/dL (ref 8.7–10.2)
Chloride: 100 mmol/L (ref 96–106)
Creatinine, Ser: 0.68 mg/dL (ref 0.57–1.00)
Globulin, Total: 3.2 g/dL (ref 1.5–4.5)
Glucose: 77 mg/dL (ref 70–99)
Potassium: 4 mmol/L (ref 3.5–5.2)
Sodium: 138 mmol/L (ref 134–144)
Total Protein: 7.8 g/dL (ref 6.0–8.5)
eGFR: 101 mL/min/{1.73_m2} (ref >59)

## 2021-11-26 LAB — LIPID PANEL
Chol/HDL Ratio: 2.7 ratio (ref 0.0–4.4)
Cholesterol, Total: 189 mg/dL (ref 100–199)
HDL: 70 mg/dL (ref >39)
LDL Chol Calc (NIH): 105 mg/dL — ABNORMAL HIGH (ref 0–99)
Triglycerides: 77 mg/dL (ref 0–149)
VLDL Cholesterol Cal: 14 mg/dL (ref 5–40)

## 2021-11-26 LAB — HEPATITIS C ANTIBODY: Hep C Virus Ab: NONREACTIVE

## 2021-12-06 ENCOUNTER — Encounter: Payer: Self-pay | Admitting: Internal Medicine

## 2022-03-04 ENCOUNTER — Ambulatory Visit (INDEPENDENT_AMBULATORY_CARE_PROVIDER_SITE_OTHER): Payer: Managed Care, Other (non HMO)

## 2022-03-04 VITALS — BP 128/70 | HR 79 | Temp 98.6°F

## 2022-03-04 DIAGNOSIS — Z23 Encounter for immunization: Secondary | ICD-10-CM | POA: Diagnosis not present

## 2022-03-04 DIAGNOSIS — R030 Elevated blood-pressure reading, without diagnosis of hypertension: Secondary | ICD-10-CM

## 2022-03-04 NOTE — Patient Instructions (Signed)
Preventing Hypertension Hypertension, also called high blood pressure, is when the force of blood pumping through the arteries is too strong. Arteries are blood vessels that carry blood from the heart throughout the body. Often, hypertension does not cause symptoms until blood pressure is very high. It is important to have your blood pressure checked regularly. Diet and lifestyle changes can help you prevent hypertension, and they may make you feel better overall and improve your quality of life. If you already have hypertension, you may control it with diet and lifestyle changes, as well as with medicine. How can this condition affect me? Over time, hypertension can damage the arteries and decrease blood flow to important parts of the body, including the brain, heart, and kidneys. By keeping your blood pressure in a healthy range, you can help prevent complications like heart attack, heart failure, stroke, kidney failure, and vascular dementia. What can increase my risk? An unhealthy diet and a lack of physical activity can make you more likely to develop high blood pressure. Some other risk factors include: Age. The risk increases with age. Having family members who have had high blood pressure. Having certain health conditions, such as thyroid problems. Being overweight or obese. Drinking too much alcohol or caffeine. Having too much fat, sugar, calories, or salt (sodium) in your diet. Smoking or using illegal drugs. Taking certain medicines, such as antidepressants, decongestants, birth control pills, and NSAIDs, such as ibuprofen. What actions can I take to prevent or manage this condition? Work with your health care provider to make a hypertension prevention plan that works for you. You may be referred for counseling on a healthy diet and physical activity. Follow your plan and keep all follow-up visits. Diet changes Maintain a healthy diet. This includes: Eating less salt (sodium). Ask your  health care provider how much sodium is safe for you to have. The general recommendation is to have less than 1 tsp (2,300 mg) of sodium a day. Do not add salt to your food. Choose low-sodium options when grocery shopping and eating out. Limiting fats in your diet. You can do this by eating low-fat or fat-free dairy products and by eating less red meat. Eating more fruits, vegetables, and whole grains. Make a goal to eat: 1-2 cups of fresh fruits and vegetables each day. 3-4 servings of whole grains each day. Avoiding foods and beverages that have added sugars. Eating fish that contain healthy fats (omega-3 fatty acids), such as mackerel or salmon. If you need help putting together a healthy eating plan, try the DASH diet. This diet is high in fruits, vegetables, and whole grains. It is low in sodium, red meat, and added sugars. DASH stands for Dietary Approaches to Stop Hypertension. Lifestyle changes  Lose weight if you are overweight. Losing just 3-5% of your body weight can help prevent or control hypertension. For example, if your present weight is 200 lb (91 kg), a loss of 3-5% of your weight means losing 6-10 lb (2.7-4.5 kg). Ask your health care provider to help you with a diet and exercise plan to safely lose weight. Get enough exercise. Do at least 150 minutes of moderate-intensity exercise each week. You could do this in short exercise sessions several times a day, or you could do longer exercise sessions a few times a week. For example, you could take a brisk 10-minute walk or bike ride, 3 times a day, for 5 days a week. Find ways to reduce stress, such as exercising, meditating, listening to   music, or taking a yoga class. If you need help reducing stress, ask your health care provider. Do not use any products that contain nicotine or tobacco. These products include cigarettes, chewing tobacco, and vaping devices, such as e-cigarettes. Chemicals in tobacco and nicotine products raise your  blood pressure each time you use them. If you need help quitting, ask your health care provider. Learn how to check your blood pressure at home. Make sure that you know your personal target blood pressure, as told by your health care provider. Try to sleep 7-9 hours per night. Alcohol use Do not drink alcohol if: Your health care provider tells you not to drink. You are pregnant, may be pregnant, or are planning to become pregnant. If you drink alcohol: Limit how much you have to: 0-1 drink a day for women. 0-2 drinks a day for men. Know how much alcohol is in your drink. In the [place] one drink equals one 12 oz bottle of beer (355 mL), one 5 oz glass of wine (148 mL), or one 1 oz glass of hard liquor (44 mL). Medicines In addition to diet and lifestyle changes, your health care provider may recommend medicines to help lower your blood pressure. In general: You may need to try a few different medicines to find what works best for you. You may need to take more than one medicine. Take over-the-counter and prescription medicines only as told by your health care provider. Questions to ask your health care provider What is my blood pressure goal? How can I lower my risk for high blood pressure? How should I monitor my blood pressure at home? Where to find support Your health care provider can help you prevent hypertension and help you keep your blood pressure at a healthy level. Your local hospital or your community may also provide support services and prevention programs. The American Heart Association offers an online support network at supportnetwork.heart.org Where to find more information Learn more about hypertension from: National Heart, Lung, and Blood Institute: www.nhlbi.nih.gov Centers for Disease Control and Prevention: www.cdc.gov American Academy of Family Physicians: familydoctor.org Learn more about the DASH diet from: National Heart, Lung, and Blood Institute:  www.nhlbi.nih.gov Contact a health care provider if: You think you are having a reaction to medicines you have taken. You have recurrent headaches or feel dizzy. You have swelling in your ankles. You have trouble with your vision. Get help right away if: You have sudden, severe chest, back, or abdominal pain or discomfort. You have shortness of breath. You have a sudden, severe headache. These symptoms may be an emergency. Get help right away. Call 911. Do not wait to see if the symptoms will go away. Do not drive yourself to the hospital. Summary Hypertension often does not cause any symptoms until blood pressure is very high. It is important to get your blood pressure checked regularly. Diet and lifestyle changes are important steps in preventing hypertension. By keeping your blood pressure in a healthy range, you may prevent complications like heart attack, heart failure, stroke, and kidney failure. Work with your health care provider to make a hypertension prevention plan that works for you. This information is not intended to replace advice given to you by your health care provider. Make sure you discuss any questions you have with your health care provider. Document Revised: 11/29/2020 Document Reviewed: 11/29/2020 Elsevier Patient Education  2023 Else[employer] Inc.  

## 2022-03-04 NOTE — Progress Notes (Signed)
Patient presents today for BPC. She is currently is not taking any medication for her BP. She is also receiving her 1st shingles vaccine today.  BP Readings from Last 3 Encounters:  03/04/22 128/70  11/25/21 138/64  07/15/17 100/62   Patient advise that ideal BP is 120/80. Patient encouraged to exercise at lease 150 mins per week.

## 2022-05-22 LAB — HM COLONOSCOPY

## 2022-11-19 ENCOUNTER — Emergency Department (HOSPITAL_BASED_OUTPATIENT_CLINIC_OR_DEPARTMENT_OTHER): Payer: Managed Care, Other (non HMO)

## 2022-11-19 ENCOUNTER — Emergency Department (HOSPITAL_BASED_OUTPATIENT_CLINIC_OR_DEPARTMENT_OTHER)
Admission: EM | Admit: 2022-11-19 | Discharge: 2022-11-19 | Disposition: A | Discharge status: Home / Self Care | Payer: Worker's Compensation | Attending: Emergency Medicine | Admitting: Emergency Medicine

## 2022-11-19 ENCOUNTER — Other Ambulatory Visit: Payer: Self-pay

## 2022-11-19 ENCOUNTER — Encounter (HOSPITAL_BASED_OUTPATIENT_CLINIC_OR_DEPARTMENT_OTHER): Payer: Self-pay

## 2022-11-19 ENCOUNTER — Ambulatory Visit
Admission: EM | Admit: 2022-11-19 | Discharge: 2022-11-19 | Discharge status: Against Medical Advice | Payer: Managed Care, Other (non HMO)

## 2022-11-19 DIAGNOSIS — M25511 Pain in right shoulder: Secondary | ICD-10-CM

## 2022-11-19 DIAGNOSIS — Z91040 Latex allergy status: Secondary | ICD-10-CM | POA: Diagnosis not present

## 2022-11-19 DIAGNOSIS — W010XXA Fall on same level from slipping, tripping and stumbling without subsequent striking against object, initial encounter: Secondary | ICD-10-CM | POA: Diagnosis not present

## 2022-11-19 DIAGNOSIS — J45909 Unspecified asthma, uncomplicated: Secondary | ICD-10-CM | POA: Insufficient documentation

## 2022-11-19 DIAGNOSIS — M25561 Pain in right knee: Secondary | ICD-10-CM | POA: Insufficient documentation

## 2022-11-19 DIAGNOSIS — Y990 Civilian activity done for income or pay: Secondary | ICD-10-CM | POA: Diagnosis not present

## 2022-11-19 DIAGNOSIS — W19XXXA Unspecified fall, initial encounter: Secondary | ICD-10-CM

## 2022-11-19 DIAGNOSIS — M79672 Pain in left foot: Secondary | ICD-10-CM | POA: Diagnosis not present

## 2022-11-19 MED ORDER — CYCLOBENZAPRINE HCL 10 MG PO TABS: 10.0000 mg | ORAL_TABLET | Freq: Three times a day (TID) | ORAL | 0 refills | Status: AC

## 2022-11-19 NOTE — ED Triage Notes (Signed)
States he fell this am on slippery floor at work today. C/O rt. Knee, rt. Shoulder, rt. Torso and HA. Denies LOC

## 2022-11-19 NOTE — Discharge Instructions (Addendum)
The x-ray of your right shoulder, right knee, left foot without any acute findings today.  Please continue to alternate Tylenol versus ibuprofen to help with pain control.  If you experience any worsening headache please return to the emergency department immediately.

## 2022-11-19 NOTE — ED Provider Notes (Cosign Needed Addendum)
[place] [facility] Provider Note   CSN: [id number] Arrival date & time: 11/19/22  1540     History  Chief Complaint  Patient presents with   Dawn [name] is a 57 y.o. female.  57 y.o female with a PMH of Asthma, Pericarditis presents to the ED status post mechanical fall which occurred around 8 AM today prior to arriving into work.  Patient reports stepped in a puddle, slipped and fell landing on the right side of her body.  She is endorsing pain along the right shoulder, right knee along with left foot, she is unsure whether she sprained her foot.  She did not lose consciousness, was able to stand up after the injury and worked a full day.  She did take some ibuprofen 800 mg without any improvement in her symptoms.  She arrives to the ED today with continue pain to her body.  No loss of consciousness, not on any blood thinners, no nausea, or vomiting.  The history is provided by the patient.  Fall Pertinent negatives include no chest pain and no shortness of breath.       Home Medications Prior to Admission medications   Medication Sig Start Date End Date Taking? Authorizing Provider  cyclobenzaprine (FLEXERIL) 10 MG tablet Take 1 tablet (10 mg total) by mouth 3 (three) times daily for 7 days. 11/19/22 11/26/22 Yes Attallah Ontko, PA-C  albuterol (VENTOLIN HFA) 108 (90 Base) MCG/ACT inhaler Inhale 2 puffs into the lungs every 6 (six) hours as needed for wheezing. 11/25/21   Dorothyann Peng, MD  b complex vitamins tablet Take 1 tablet by mouth daily.    [provider]  Biotin 5000 MCG CAPS Take by mouth every morning.    [provider]  docusate sodium (COLACE) 100 MG capsule Take 1 capsule (100 mg total) by mouth 2 (two) times daily. Patient not taking: Reported on 07/13/2017 06/16/16   Quillian Quince D, MD  estradiol (ESTRACE) 1 MG tablet TAKE 1 AND 1/2 TABLETS BY MOUTH EVERY DAY 12/23/19 12/22/20  Marlow Baars,  MD  estradiol (ESTRACE) 2 MG tablet Take 2 mg by mouth daily.    [provider]  levocetirizine (XYZAL) 5 MG tablet Take 5 mg by mouth every evening. Patient not taking: Reported on 11/25/2021    [provider]  Multiple Vitamins-Minerals (MULTIVITAMIN WITH MINERALS) tablet Take by mouth daily.     [provider]  omega-3 acid ethyl esters (LOVAZA) 1 g capsule Take by mouth every morning. Patient not taking: Reported on 11/25/2021    [provider]  predniSONE (DELTASONE) 20 MG tablet Take 2 tablets (40 mg total) by mouth daily with breakfast. Patient not taking: Reported on 07/13/2017 04/20/17   Demetrios Loll T, PA-C      Allergies    Coconut (cocos nucifera), Latex, and Sulfa antibiotics    Review of Systems   Review of Systems  Constitutional:  Negative for fever.  Respiratory:  Negative for shortness of breath.   Cardiovascular:  Negative for chest pain.  Musculoskeletal:  Positive for arthralgias and back pain.    Physical Exam Updated Vital Signs BP (!) 156/77 (BP Location: Left Arm)   Pulse 76   Temp 98.3 F (36.8 C)   Resp 18   Ht 5\' 4"  (1.626 m)   Wt 69.9 kg   SpO2 100%   BMI 26.43 kg/m  Physical Exam Vitals and nursing note reviewed.  Constitutional:  Appearance: Normal appearance.  HENT:     Head: Normocephalic.     Comments: Small palpable goose egg to the right temple.     Mouth/Throat:     Mouth: Mucous membranes are moist.  Eyes:     Pupils: Pupils are equal, round, and reactive to light.  Cardiovascular:     Rate and Rhythm: Normal rate.  Pulmonary:     Effort: Pulmonary effort is normal.  Abdominal:     General: Abdomen is flat.  Musculoskeletal:     Cervical back: Normal range of motion and neck supple.  Skin:    General: Skin is warm and dry.  Neurological:     Mental Status: She is alert and oriented to person, place, and time.     Comments: Alert, oriented, thought content appropriate. Speech  fluent without evidence of aphasia. Able to follow 2 step commands without difficulty.  Cranial Nerves:  II:  Peripheral visual fields grossly normal, pupils, round, reactive to light III,IV, VI: ptosis not present, extra-ocular motions intact bilaterally  V,VII: smile symmetric, facial light touch sensation equal VIII: hearing grossly normal bilaterally  IX,X: midline uvula rise  XI: bilateral shoulder shrug equal and strong XII: midline tongue extension  Motor:  5/5 in upper and lower extremities bilaterally including strong and equal grip strength and dorsiflexion/plantar flexion Sensory: light touch normal in all extremities.  Cerebellar: normal finger-to-nose with bilateral upper extremities, pronator drift negative Gait: normal gait and balance       ED Results / Procedures / Treatments   Labs (all labs ordered are listed, but only abnormal results are displayed) Labs Reviewed - No data to display  EKG None  Radiology DG Foot Complete Left  Result Date: 11/19/2022 CLINICAL DATA:  Fall on slippery floor at work today. Left foot pain. EXAM: LEFT FOOT - COMPLETE 3+ VIEW COMPARISON:  None Available. FINDINGS: There is no evidence of fracture or dislocation. Mild degenerative change of the first metatarsal phalangeal joint. There is also an erosion involving the medial aspect of the first metatarsal head. Small plantar calcaneal spur. Soft tissues are unremarkable. IMPRESSION: 1. No acute fracture or dislocation of the left foot. 2. Mild degenerative change of the first metatarsophalangeal joint. 3. Erosion involving the medial aspect of the first metatarsal head, location and appearance typical of gout. Electronically Signed   By: Narda Rutherford M.D.   On: 11/19/2022 17:36   DG Knee Complete 4 Views Right  Result Date: 11/19/2022 CLINICAL DATA:  Fall on slippery floor at work today. Right knee pain. EXAM: RIGHT KNEE - COMPLETE 4+ VIEW COMPARISON:  03/03/2014 FINDINGS: No acute  fracture or dislocation. Medial compartment hemiarthroplasty. No periprosthetic lucency. Mild subchondral changes in the patellofemoral compartment. Trace knee joint effusion. No erosion or focal bone abnormality. IMPRESSION: 1. No acute fracture or dislocation of the right knee. 2. Medial compartment hemiarthroplasty without complication. Electronically Signed   By: Narda Rutherford M.D.   On: 11/19/2022 17:34   DG Shoulder Right  Result Date: 11/19/2022 CLINICAL DATA:  Fall on slippery floor at work today. Right shoulder pain. EXAM: RIGHT SHOULDER - 2+ VIEW COMPARISON:  None Available. FINDINGS: There is no evidence of fracture or dislocation. Minor degenerative change of the acromioclavicular joint. Trace inferior glenoid spurring. No erosions. Soft tissues are unremarkable. IMPRESSION: No fracture or dislocation of the right shoulder. Electronically Signed   By: Narda Rutherford M.D.   On: 11/19/2022 17:33    Procedures Procedures    Medications Ordered  in ED Medications - No data to display  ED Course/ Medical Decision Making/ A&P                                 Medical Decision Making Amount and/or Complexity of Data Reviewed Radiology: ordered.  Risk Prescription drug management.   Patient presents to the ED status post mechanical fall which occurred around 8 AM today, reports slipping on a small puddle prior to entering work when suddenly she fell onto the right side.  Endorsing pain along her right shoulder, right hand, left foot.  Did take some ibuprofen without much improvement in symptoms.  Did have a headache at first, she did not lose consciousness, she is currently on no blood thinners.  There is a small hematoma noted to the right temporal area without any active bleeding noted.  She moves all upper and lower extremities with an unremarkable neurological exam.  X-ray of her right shoulder, right knee, left foot were obtained while in the emergency department. Her x-rays  without any acute finding.  I discussed these results with patient, I did discuss RICE therapy, ice and heat along with Tylenol versus ibuprofen.  Patient is hemodynamically stable for discharge.  Portions of this note were generated with Scientist, clinical (histocompatibility and immunogenetics). Dictation errors may occur despite best attempts at proofreading.  Final Clinical Impression(s) / ED Diagnoses Final diagnoses:  Fall, initial encounter  Acute pain of right shoulder    Rx / DC Orders ED Discharge Orders          Ordered    cyclobenzaprine (FLEXERIL) 10 MG tablet  3 times daily        11/19/22 1753              Claude Manges, PA-C 11/19/22 1750    Claude Manges, PA-C 11/19/22 1753    Virgina Norfolk, DO 11/20/22 [id number]

## 2022-11-28 ENCOUNTER — Other Ambulatory Visit: Payer: Self-pay | Admitting: Internal Medicine

## 2022-11-28 DIAGNOSIS — Z1212 Encounter for screening for malignant neoplasm of rectum: Secondary | ICD-10-CM

## 2022-11-28 DIAGNOSIS — Z1211 Encounter for screening for malignant neoplasm of colon: Secondary | ICD-10-CM

## 2022-12-09 ENCOUNTER — Encounter: Payer: Self-pay | Admitting: Nurse Practitioner

## 2022-12-16 ENCOUNTER — Ambulatory Visit: Payer: 59 | Admitting: Internal Medicine

## 2022-12-16 ENCOUNTER — Encounter: Payer: Self-pay | Admitting: Internal Medicine

## 2022-12-16 VITALS — BP 118/60 | HR 67 | Temp 98.6°F | Ht 62.4 in | Wt 162.8 lb

## 2022-12-16 DIAGNOSIS — L232 Allergic contact dermatitis due to cosmetics: Secondary | ICD-10-CM

## 2022-12-16 DIAGNOSIS — Z114 Encounter for screening for human immunodeficiency virus [HIV]: Secondary | ICD-10-CM | POA: Diagnosis not present

## 2022-12-16 DIAGNOSIS — F0781 Postconcussional syndrome: Secondary | ICD-10-CM

## 2022-12-16 DIAGNOSIS — Z0000 Encounter for general adult medical examination without abnormal findings: Secondary | ICD-10-CM | POA: Diagnosis not present

## 2022-12-16 DIAGNOSIS — Z23 Encounter for immunization: Secondary | ICD-10-CM | POA: Diagnosis not present

## 2022-12-16 MED ORDER — TRIAMCINOLONE ACETONIDE 0.1 % EX CREA: TOPICAL_CREAM | CUTANEOUS | 0 refills | Status: DC

## 2022-12-16 NOTE — Patient Instructions (Signed)

## 2022-12-16 NOTE — Progress Notes (Signed)
I,Dawn [name], CMA,acting as a Neurosurgeon for [name], MD.,have documented all relevant documentation on the behalf of [name], MD,as directed by  [name], MD while in the presence of [name], MD.  Subjective:    Patient ID: Dawn [name] , female    DOB: [birth year] , 57 y.o.   MRN: [id number]  Chief Complaint  Patient presents with   Annual Exam    HPI  Patient presents today for a physical. Patient is followed by [facility] at [facility] for her GYN care. Patient reports she had a fall a couple weeks ago 11/19/22.  She states fell walking into the building at work.  She states the floors were wet and this caused her fall.  She fell forward and hit her right shoulder, right knee and head.  Immediately after the fall, she took ibuprofen and worked.  Headache/dizziness started so she decided to go to ER.  She was diagnosed with a concussion. She reports she is still having occasional headaches, she has been evaluated by workmen's comp. This evaluation was done at [facility].    Lastly, she has received a cologuard at her home but she hasn't done it because she didn't know who it came from.  Letter for mammogram and pap smear have been sent.    Asthma There is no difficulty breathing, frequent throat clearing or hoarse voice. Her symptoms are aggravated by exposure to fumes. Her symptoms are alleviated by beta-agonist. Her past medical history is significant for asthma.     Past Medical History:  Diagnosis Date   Acute meniscal tear of knee LEFT   Arthritis    Asthma    Constipation    Contact lens/glasses fitting    wears contacts or glasses   Environmental allergies    Gallbladder problem    History of gastric ulcer AS TEEN   History of stomach ulcers    History of viral pericarditis PROBABLE OR IDIOPATHIC PER D/C SUMMARY MARCH 2011   NO PROBLEMS SINCE   Joint pain    Lactose intolerance    Multiple food allergies     coconut   Osteoarthritis    Pericarditis    Primary localized osteoarthritis of right knee 11/25/2011   S/P Left Knee arthroscopy performed by Dr. Charlann Boxer in April 2013.    Seasonal allergies    Torn rotator cuff      Family History  Problem Relation Age of Onset   Cancer Brother    Asthma Son    Breast cancer Paternal Aunt      Current Outpatient Medications:    albuterol (VENTOLIN HFA) 108 (90 Base) MCG/ACT inhaler, Inhale 2 puffs into the lungs every 6 (six) hours as needed for wheezing., Disp: 18 g, Rfl: 5   b complex vitamins tablet, Take 1 tablet by mouth daily., Disp: , Rfl:    Biotin 5000 MCG CAPS, Take by mouth every morning., Disp: , Rfl:    estradiol (ESTRACE) 1 MG tablet, TAKE 1 AND 1/2 TABLETS BY MOUTH EVERY DAY (Patient taking differently: Take 1 mg by mouth daily.), Disp: 140 tablet, Rfl: 3   Multiple Vitamins-Minerals (MULTIVITAMIN WITH MINERALS) tablet, Take by mouth daily. , Disp: , Rfl:    triamcinolone cream (KENALOG) 0.1 %, APPLY TO AFFECTED AREA TWICE DAILY AS NEEDED, Disp: 30 g, Rfl: 0   Allergies  Allergen Reactions   Coconut (Cocos Nucifera) Anaphylaxis, Shortness Of Breath and Swelling    Coconut  Latex Swelling    AND BLISTERS   Sulfa Antibiotics Anaphylaxis      The patient states she uses status post hysterectomy for birth control. No LMP recorded. Patient has had a hysterectomy.. Negative for Dysmenorrhea. Negative for: breast discharge, breast lump(s), breast pain and breast self exam. Associated symptoms include abnormal vaginal bleeding. Pertinent negatives include abnormal bleeding (hematology), anxiety, decreased libido, depression, difficulty falling sleep, dyspareunia, history of infertility, nocturia, sexual dysfunction, sleep disturbances, urinary incontinence, urinary urgency, vaginal discharge and vaginal itching. Diet regular.    . The patient's tobacco use is:  Social History   Tobacco Use  Smoking Status Never  Smokeless Tobacco Never   . She has been exposed to passive smoke. The patient's alcohol use is:  Social History   Substance and Sexual Activity  Alcohol Use No   Review of Systems  Constitutional: Negative.   HENT: Negative.  Negative for hoarse voice.   Eyes: Negative.   Respiratory: Negative.    Cardiovascular: Negative.   Gastrointestinal: Negative.   Endocrine: Negative.   Genitourinary: Negative.   Musculoskeletal: Negative.   Skin: Negative.   Allergic/Immunologic: Negative.   Neurological: Negative.   Hematological: Negative.   Psychiatric/Behavioral: Negative.       Today's Vitals   12/16/22 0843  BP: 118/60  Pulse: 67  Temp: 98.6 F (37 C)  Weight: 162 lb 12.8 oz (73.8 kg)  Height: 5' 2.4" (1.585 m)  PainSc: 0-No pain   Body mass index is 29.4 kg/m.  Wt Readings from Last 3 Encounters:  12/16/22 162 lb 12.8 oz (73.8 kg)  11/19/22 154 lb (69.9 kg)  11/25/21 165 lb 12.8 oz (75.2 kg)    The 10-year ASCVD risk score (Arnett DK, et al., 2019) is: 3.3%   Values used to calculate the score:     Age: 60 years     Sex: Female     Is Non-Hispanic African American: Yes     Diabetic: No     Tobacco smoker: No     Systolic Blood Pressure: 118 mmHg     Is BP treated: No     HDL Cholesterol: 73 mg/dL     Total Cholesterol: 216 mg/dL   Objective:  Physical Exam Vitals and nursing note reviewed.  Constitutional:      Appearance: Normal appearance.  HENT:     Head: Normocephalic and atraumatic.     Right Ear: Tympanic membrane, ear canal and external ear normal.     Left Ear: Tympanic membrane, ear canal and external ear normal.     Nose: Nose normal.     Mouth/Throat:     Mouth: Mucous membranes are moist.     Pharynx: Oropharynx is clear.  Eyes:     Extraocular Movements: Extraocular movements intact.     Conjunctiva/sclera: Conjunctivae normal.     Pupils: Pupils are equal, round, and reactive to light.  Cardiovascular:     Rate and Rhythm: Normal rate and regular rhythm.      Pulses: Normal pulses.     Heart sounds: Normal heart sounds.  Pulmonary:     Effort: Pulmonary effort is normal.     Breath sounds: Normal breath sounds.  Abdominal:     General: Abdomen is flat. Bowel sounds are normal.     Palpations: Abdomen is soft.  Genitourinary:    Comments: deferred Musculoskeletal:        General: Normal range of motion.     Cervical back: Normal range of motion  and neck supple.  Skin:    General: Skin is warm and dry.     Comments: Hyperpigmentation base of neck, no vesicular lesions noted  Neurological:     General: No focal deficit present.     Mental Status: She is alert and oriented to person, place, and time.  Psychiatric:        Mood and Affect: Mood normal.        Behavior: Behavior normal.         Assessment And Plan:     Routine general medical examination at health care facility Assessment & Plan: A full exam was performed.  Importance of monthly self breast exams was discussed with the patient.  She is advised to get 30-45 minutes of regular exercise, no less than four to five days per week. Both weight-bearing and aerobic exercises are recommended.  She is advised to follow a healthy diet with at least six fruits/veggies per day, decrease intake of red meat and other saturated fats and to increase fish intake to twice weekly.  Meats/fish should not be fried -- baked, boiled or broiled is preferable. It is also important to cut back on your sugar intake.  Be sure to read labels - try to avoid anything with added sugar, high fructose corn syrup or other sweeteners.  If you must use a sweetener, you can try stevia or monkfruit.  It is also important to avoid artificially sweetened foods/beverages and diet drinks. Lastly, wear SPF 50 sunscreen on exposed skin and when in direct sunlight for an extended period of time.  Be sure to avoid fast food restaurants and aim for at least 60 ounces of water daily.      Orders: -     CBC -      CMP14+EGFR -     Lipid panel -     Lipoprotein A (LPA) -     TSH  Post concussion syndrome Assessment & Plan: Sept 2024 ER notes reviewed in detail. Pt encouraged to ask [facility] provider to forward all notes. She is encouraged to stay well hydrated and reminded that it can take weeks to fully recover from a concussion. She agrees to consider wearing non-slip shoes or working from home when it rains.    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 Patient was given opportunity to ask questions. Patient verbalized understanding of the plan and was able to repeat key elements of the plan. All questions were answered to their satisfaction.    I, [name], MD, have reviewed all documentation for this visit. The documentation on 12/16/22 for the exam, diagnosis, procedures, and orders are all accurate and complete.

## 2022-12-18 LAB — TSH: TSH: 1.08 u[IU]/mL (ref 0.450–4.500)

## 2022-12-18 LAB — CMP14+EGFR
ALT: 14 [IU]/L (ref 0–32)
AST: 21 [IU]/L (ref 0–40)
Albumin: 4.4 g/dL (ref 3.8–4.9)
Alkaline Phosphatase: 74 [IU]/L (ref 44–121)
BUN/Creatinine Ratio: 27 — ABNORMAL HIGH (ref 9–23)
BUN: 18 mg/dL (ref 6–24)
Bilirubin Total: 0.3 mg/dL (ref 0.0–1.2)
CO2: 23 mmol/L (ref 20–29)
Calcium: 9.7 mg/dL (ref 8.7–10.2)
Chloride: 103 mmol/L (ref 96–106)
Creatinine, Ser: 0.66 mg/dL (ref 0.57–1.00)
Globulin, Total: 3 g/dL (ref 1.5–4.5)
Glucose: 80 mg/dL (ref 70–99)
Potassium: 4.6 mmol/L (ref 3.5–5.2)
Sodium: 139 mmol/L (ref 134–144)
Total Protein: 7.4 g/dL (ref 6.0–8.5)
eGFR: 101 mL/min/{1.73_m2} (ref >59)

## 2022-12-18 LAB — CBC
Hematocrit: 38.8 % (ref 34.0–46.6)
Hemoglobin: 12.6 g/dL (ref 11.1–15.9)
MCH: 31.3 pg (ref 26.6–33.0)
MCHC: 32.5 g/dL (ref 31.5–35.7)
MCV: 97 fL (ref 79–97)
Platelets: 268 10*3/uL (ref 150–450)
RBC: 4.02 x10E6/uL (ref 3.77–5.28)
RDW: 11.7 % (ref 11.7–15.4)
WBC: 6.5 10*3/uL (ref 3.4–10.8)

## 2022-12-18 LAB — LIPID PANEL
Chol/HDL Ratio: 3 ratio (ref 0.0–4.4)
Cholesterol, Total: 216 mg/dL — ABNORMAL HIGH (ref 100–199)
HDL: 73 mg/dL (ref >39)
LDL Chol Calc (NIH): 129 mg/dL — ABNORMAL HIGH (ref 0–99)
Triglycerides: 77 mg/dL (ref 0–149)
VLDL Cholesterol Cal: 14 mg/dL (ref 5–40)

## 2022-12-18 LAB — LIPOPROTEIN A (LPA): Lipoprotein (a): 101.4 nmol/L — ABNORMAL HIGH (ref <75.0)

## 2022-12-18 LAB — HIV ANTIBODY (ROUTINE TESTING W REFLEX): HIV Screen 4th Generation wRfx: NONREACTIVE

## 2022-12-21 DIAGNOSIS — L232 Allergic contact dermatitis due to cosmetics: Secondary | ICD-10-CM | POA: Insufficient documentation

## 2022-12-21 DIAGNOSIS — F0781 Postconcussional syndrome: Secondary | ICD-10-CM | POA: Insufficient documentation

## 2022-12-21 NOTE — Assessment & Plan Note (Signed)
Sept 2024 ER notes reviewed in detail. Pt encouraged to ask [facility] provider to forward all notes. She is encouraged to stay well hydrated and reminded that it can take weeks to fully recover from a concussion. She agrees to consider wearing non-slip shoes or working from home when it rains.

## 2022-12-21 NOTE — Assessment & Plan Note (Signed)

## 2022-12-22 ENCOUNTER — Encounter: Payer: Self-pay | Admitting: Internal Medicine

## 2022-12-23 ENCOUNTER — Other Ambulatory Visit: Payer: Self-pay | Admitting: Internal Medicine

## 2022-12-23 DIAGNOSIS — E7800 Pure hypercholesterolemia, unspecified: Secondary | ICD-10-CM

## 2023-01-02 ENCOUNTER — Ambulatory Visit (HOSPITAL_COMMUNITY)
Admission: RE | Admit: 2023-01-02 | Discharge: 2023-01-02 | Disposition: A | Discharge status: Home / Self Care | Payer: Managed Care, Other (non HMO) | Source: Ambulatory Visit | Attending: Internal Medicine | Admitting: Internal Medicine

## 2023-01-02 DIAGNOSIS — E7800 Pure hypercholesterolemia, unspecified: Secondary | ICD-10-CM

## 2023-01-03 ENCOUNTER — Other Ambulatory Visit: Payer: Self-pay | Admitting: Internal Medicine

## 2023-01-03 DIAGNOSIS — I2510 Atherosclerotic heart disease of native coronary artery without angina pectoris: Secondary | ICD-10-CM

## 2023-01-03 MED ORDER — ATORVASTATIN CALCIUM 20 MG PO TABS: 20.0000 mg | ORAL_TABLET | Freq: Every day | ORAL | 11 refills | Status: AC

## 2023-01-13 ENCOUNTER — Ambulatory Visit: Payer: Managed Care, Other (non HMO)

## 2023-01-13 DIAGNOSIS — Z23 Encounter for immunization: Secondary | ICD-10-CM

## 2023-01-13 NOTE — Progress Notes (Signed)
ERRONEOUS ENCOUNTER

## 2023-01-26 LAB — COLOGUARD: COLOGUARD: NEGATIVE

## 2023-02-02 NOTE — Progress Notes (Unsigned)
  Cardiology Office Note:  .   Date:  02/02/2023 ID:  Dawn [name], DOB [birth year], MRN [id number] PCP: Dorothyann Peng, MD [facility] Providers Cardiologist:  None { Click to update primary MD,subspecialty MD or APP then REFRESH:1}   Patient Profile: .      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   Dawn [name] is a *** 57 y.o. female  who is here today for new patient consult for ***  Family history: Her family history includes Asthma in her son; Breast cancer in her paternal aunt; Cancer in her brother.   ASCVD Risk Score: The 10-year ASCVD risk score (Arnett DK, et al., 2019) is: 2.7%   Values used to calculate the score:     Age: 57 years     Sex: Female     Is Non-Hispanic African American: Yes     Diabetic: No     Tobacco smoker: No     Systolic Blood Pressure: 110 mmHg     Is BP treated: No     HDL Cholesterol: 73 mg/dL     Total Cholesterol: 216 mg/dL  {MD Calc ASCVD Calculator :1}  Diet:  Activity:  ROS: ***       Studies Reviewed: .          *** Risk Assessment/Calculations:   {Does this patient have ATRIAL FIBRILLATION?[phone]} No BP recorded.  {Refresh Note OR Click here to enter BP  :1}***       Physical Exam:   VS: There were no vitals taken for this visit.  Wt Readings from Last 3 Encounters:  01/13/23 162 lb (73.5 kg)  12/16/22 162 lb 12.8 oz (73.8 kg)  11/19/22 154 lb (69.9 kg)     GEN: Well nourished, well developed in no acute distress NECK: No JVD; No carotid bruits CARDIAC: ***RRR, no murmurs, rubs, gallops RESPIRATORY:  Clear to auscultation without rales, wheezing or rhonchi  ABDOMEN: Soft, non-tender, non-distended EXTREMITIES:  No edema; No deformity     ASSESSMENT AND PLAN: .    CAD:  Hyperlipidemia LDL  goal < 70: Lipid panel completed 12/16/2022 with total cholesterol 216, triglycerides 77, HDL 73, and LDL 129. Elevated LP(a).   CV Risk Assessment  Plan/Goals:  Activity: ***      {Are you ordering a CV Procedure (e.g. stress test, cath, DCCV, TEE, etc)?   Press F2        :[phone]}  Dispo: ***  Signed, Eligha Bridegroom, NP-C

## 2023-02-04 ENCOUNTER — Ambulatory Visit (INDEPENDENT_AMBULATORY_CARE_PROVIDER_SITE_OTHER): Payer: Managed Care, Other (non HMO) | Admitting: Nurse Practitioner

## 2023-02-04 ENCOUNTER — Encounter (HOSPITAL_BASED_OUTPATIENT_CLINIC_OR_DEPARTMENT_OTHER): Payer: Self-pay | Admitting: Nurse Practitioner

## 2023-02-04 ENCOUNTER — Other Ambulatory Visit (HOSPITAL_BASED_OUTPATIENT_CLINIC_OR_DEPARTMENT_OTHER): Payer: Self-pay | Admitting: *Deleted

## 2023-02-04 VITALS — BP 126/72 | HR 65 | Ht 64.0 in | Wt 171.2 lb

## 2023-02-04 DIAGNOSIS — R0602 Shortness of breath: Secondary | ICD-10-CM | POA: Diagnosis not present

## 2023-02-04 DIAGNOSIS — E785 Hyperlipidemia, unspecified: Secondary | ICD-10-CM

## 2023-02-04 DIAGNOSIS — I2510 Atherosclerotic heart disease of native coronary artery without angina pectoris: Secondary | ICD-10-CM

## 2023-02-04 MED ORDER — METOPROLOL TARTRATE 50 MG PO TABS: ORAL_TABLET | ORAL | 0 refills | Status: DC

## 2023-02-04 NOTE — Patient Instructions (Signed)
Medication Instructions:   Please start your atorvastatin  *If you need a refill on your cardiac medications before your next appointment, please call your pharmacy*   Lab Work:  Your physician recommends that you return for a FASTING lipid profile 2-3  months.    If you have labs (blood work) drawn today and your tests are completely normal, you will receive your results only by: MyChart Message (if you have MyChart) OR A paper copy in the mail If you have any lab test that is abnormal or we need to change your treatment, we will call you to review the results.   Testing/Procedures:  Your physician has requested that you have an echocardiogram. Echocardiography is a painless test that uses sound waves to create images of your heart. It provides your doctor with information about the size and shape of your heart and how well your heart's chambers and valves are working. This procedure takes approximately one hour. There are no restrictions for this procedure. Please do NOT wear cologne, perfume, or lotions (deodorant is allowed). Please arrive 15 minutes prior to your appointment time.  Please note: We ask at that you not bring children with you during ultrasound (echo/ vascular) testing. Due to room size and safety concerns, children are not allowed in the ultrasound rooms during exams. Our front office staff cannot provide observation of children in our lobby area while testing is being conducted. An adult accompanying a patient to their appointment will only be allowed in the ultrasound room at the discretion of the ultrasound technician under special circumstances. We apologize for any inconvenience.    Your cardiac CT will be scheduled at one of the below locations:   [facility] [address] [place], [place] [address] [phone]  If scheduled at [facility], please arrive at the [facility] and Children's Entrance (Entrance C2) of [facility] 30  minutes prior to test start time. You can use the FREE valet parking offered at entrance C (encouraged to control the heart rate for the test)  Proceed to the [facility] Radiology Department (first floor) to check-in and test prep.  All radiology patients and guests should use entrance C2 at [facility], accessed from [facility], even though the hospital's physical address listed is [address].    There is spacious parking and easy access to the radiology department from the [facility] entrance. Please enter here and check-in with the desk attendant.   Please follow these instructions carefully (unless otherwise directed):  An IV will be required for this test and Nitroglycerin will be given.   On the Night Before the Test: Be sure to Drink plenty of water. Do not consume any caffeinated/decaffeinated beverages or chocolate 12 hours prior to your test. Do not take any antihistamines 12 hours prior to your test.  On the Day of the Test: Drink plenty of water until 1 hour prior to the test. Do not eat any food 1 hour prior to test. You may take your regular medications prior to the test.  Take metoprolol (Lopressor) one tablet by mouth ( 50 mg)  two hours prior to test. Patients who wear a continuous glucose monitor MUST remove the device prior to scanning. FEMALES- please wear underwire-free bra if available, avoid dresses & tight clothing      After the Test: Drink plenty of water. After receiving IV contrast, you may experience a mild flushed feeling. This is normal.  On occasion, you may experience a mild rash up to 24 hours after the test. This is not dangerous. If this occurs, you can take Benadryl 25 mg and increase your fluid intake. If you experience trouble breathing, this can be serious. If it is severe call 911 IMMEDIATELY. If it is mild, please call our office.  We will call to schedule your test 2-4 weeks out understanding  that some insurance companies will need an authorization prior to the service being performed.   For more information and frequently asked questions, please visit our website : [email or web address]  For non-scheduling related questions, please contact the cardiac imaging nurse navigator should you have any questions/concerns: Cardiac Imaging Nurse Navigators Direct Office Dial: [phone]   For scheduling needs, including cancellations and rescheduling, please call [place], [phone].    Follow-Up: At [facility], you and your health needs are our priority.  As part of our continuing mission to provide you with exceptional heart care, we have created designated Provider Care Teams.  These Care Teams include your primary Cardiologist (physician) and Advanced Practice Providers (APPs -  Physician Assistants and Nurse Practitioners) who all work together to provide you with the care you need, when you need it.  We recommend signing up for the patient portal called "MyChart".  Sign up information is provided on this After Visit Summary.  MyChart is used to connect with patients for Virtual Visits (Telemedicine).  Patients are able to view lab/test results, encounter notes, upcoming appointments, etc.  Non-urgent messages can be sent to your provider as well.   To learn more about what you can do with MyChart, go to [email or web address].    Your next appointment:   3 month(s)  Provider:   Eligha Bridegroom, NP    Other Instructions  GOALS: Exercise recommendations: Goal of exercising for at least 30 minutes a day, at least 5 times per week.  Please exercise to a moderate exertion.  This means that while exercising it is difficult to speak in full sentences, however you are not so short of breath that you feel you must stop, and not so comfortable that you can carry on a full conversation.  Exertion level should be approximately a 5/10, if 10 is the most exertion  you can perform.  Diet recommendations: Recommend a heart healthy diet such as the Mediterranean diet.  This diet consists of plant based foods, healthy fats, lean meats, olive oil.  It suggests limiting the intake of simple carbohydrates such as white breads, pastries, and pastas.  It also limits the amount of red meat, wine, and dairy products such as cheese that one should consume on a daily basis.

## 2023-02-12 ENCOUNTER — Encounter (HOSPITAL_BASED_OUTPATIENT_CLINIC_OR_DEPARTMENT_OTHER): Payer: Self-pay

## 2023-02-12 ENCOUNTER — Telehealth: Payer: Self-pay | Admitting: Cardiovascular Disease

## 2023-02-12 NOTE — Telephone Encounter (Signed)
See patient message encounter on 12/19.

## 2023-02-12 NOTE — Telephone Encounter (Signed)
Pt c/o medication issue:  1. Name of Medication: atorvastatin (LIPITOR) 20 MG tablet   2. How are you currently taking this medication (dosage and times per day)? a  3. Are you having a reaction (difficulty breathing--STAT)? no  4. What is your medication issue? Medication is causing the patient pain in her leg. Wanting to know what she needs to do. Please advise

## 2023-02-23 ENCOUNTER — Other Ambulatory Visit (HOSPITAL_COMMUNITY): Payer: Managed Care, Other (non HMO)

## 2023-02-23 ENCOUNTER — Telehealth (HOSPITAL_COMMUNITY): Payer: Self-pay | Admitting: *Deleted

## 2023-02-23 NOTE — Telephone Encounter (Signed)
Attempted to call patient regarding upcoming cardiac CT appointment. Left message on voicemail with name and callback number Hayley Sharpe RN Navigator Cardiac Imaging [facility] [phone] Office   

## 2023-02-23 NOTE — Telephone Encounter (Signed)
Reaching out to patient to offer assistance regarding upcoming cardiac imaging study; pt verbalizes understanding of appt date/time, parking situation and where to check in, pre-test NPO status and medications ordered, and verified current allergies; name and call back number provided for further questions should they arise Hayley Sharpe RN Navigator Cardiac Imaging [facility] [phone] office [phone] cell  

## 2023-02-24 ENCOUNTER — Ambulatory Visit (HOSPITAL_COMMUNITY)
Admission: RE | Admit: 2023-02-24 | Discharge: 2023-02-24 | Disposition: A | Discharge status: Home / Self Care | Payer: Managed Care, Other (non HMO) | Source: Ambulatory Visit | Attending: Nurse Practitioner | Admitting: Nurse Practitioner

## 2023-02-24 DIAGNOSIS — E785 Hyperlipidemia, unspecified: Secondary | ICD-10-CM

## 2023-02-24 MED ORDER — NITROGLYCERIN 0.4 MG SL SUBL
0.8000 mg | SUBLINGUAL_TABLET | Freq: Once | SUBLINGUAL | Status: AC
  Administered 2023-02-24: 0.8 mg via SUBLINGUAL

## 2023-02-24 MED ORDER — IOHEXOL 350 MG/ML SOLN
95.0000 mL | Freq: Once | INTRAVENOUS | Status: AC | PRN
  Administered 2023-02-24: 95 mL via INTRAVENOUS

## 2023-02-24 MED ORDER — NITROGLYCERIN 0.4 MG SL SUBL
SUBLINGUAL_TABLET | SUBLINGUAL | Status: AC
  Filled 2023-02-24: qty 2

## 2023-02-26 ENCOUNTER — Telehealth: Payer: Self-pay | Admitting: *Deleted

## 2023-02-26 MED ORDER — ASPIRIN 81 MG PO TBEC: 81.0000 mg | DELAYED_RELEASE_TABLET | Freq: Every day | ORAL | Status: DC

## 2023-02-26 NOTE — Telephone Encounter (Signed)
 Pt aware of CT results, see result note.

## 2023-03-09 ENCOUNTER — Other Ambulatory Visit (HOSPITAL_BASED_OUTPATIENT_CLINIC_OR_DEPARTMENT_OTHER): Payer: Self-pay | Admitting: Radiology

## 2023-03-09 ENCOUNTER — Ambulatory Visit (INDEPENDENT_AMBULATORY_CARE_PROVIDER_SITE_OTHER): Payer: Managed Care, Other (non HMO)

## 2023-03-09 DIAGNOSIS — R0609 Other forms of dyspnea: Secondary | ICD-10-CM

## 2023-03-09 DIAGNOSIS — E785 Hyperlipidemia, unspecified: Secondary | ICD-10-CM

## 2023-03-09 LAB — ECHOCARDIOGRAM COMPLETE
Area-P 1/2: 3.37 cm2
S' Lateral: 2.22 cm

## 2023-03-09 MED ORDER — PERFLUTREN LIPID MICROSPHERE
1.0000 mL | INTRAVENOUS | Status: DC | PRN
  Administered 2023-03-09: 2 mL via INTRAVENOUS

## 2023-03-19 ENCOUNTER — Encounter: Payer: Self-pay | Admitting: Internal Medicine

## 2023-03-19 ENCOUNTER — Ambulatory Visit: Payer: Managed Care, Other (non HMO) | Admitting: Internal Medicine

## 2023-03-19 VITALS — BP 124/82 | HR 83 | Temp 98.2°F | Ht 64.0 in | Wt 165.6 lb

## 2023-03-19 DIAGNOSIS — E7800 Pure hypercholesterolemia, unspecified: Secondary | ICD-10-CM

## 2023-03-19 DIAGNOSIS — M791 Myalgia, unspecified site: Secondary | ICD-10-CM

## 2023-03-19 DIAGNOSIS — I2584 Coronary atherosclerosis due to calcified coronary lesion: Secondary | ICD-10-CM

## 2023-03-19 DIAGNOSIS — Z23 Encounter for immunization: Secondary | ICD-10-CM | POA: Diagnosis not present

## 2023-03-19 DIAGNOSIS — I2510 Atherosclerotic heart disease of native coronary artery without angina pectoris: Secondary | ICD-10-CM

## 2023-03-19 NOTE — Patient Instructions (Signed)
Cholesterol Content in Foods Cholesterol is a waxy, fat-like substance that helps to carry fat in the blood. The body needs cholesterol in small amounts, but too much cholesterol can cause damage to the arteries and heart. What foods have cholesterol?  Cholesterol is found in animal-based foods, such as meat, seafood, and dairy. Generally, low-fat dairy and lean meats have less cholesterol than full-fat dairy and fatty meats. The milligrams of cholesterol per serving (mg per serving) of common cholesterol-containing foods are listed below. Meats and other proteins Egg -- one large whole egg has 186 mg. Veal shank -- 4 oz (113 g) has 141 mg. Lean ground turkey (93% lean) -- 4 oz (113 g) has 118 mg. Fat-trimmed lamb loin -- 4 oz (113 g) has 106 mg. Lean ground beef (90% lean) -- 4 oz (113 g) has 100 mg. Lobster -- 3.5 oz (99 g) has 90 mg. Pork loin chops -- 4 oz (113 g) has 86 mg. Canned salmon -- 3.5 oz (99 g) has 83 mg. Fat-trimmed beef top loin -- 4 oz (113 g) has 78 mg. Frankfurter -- 1 frank (3.5 oz or 99 g) has 77 mg. Crab -- 3.5 oz (99 g) has 71 mg. Roasted chicken without skin, white meat -- 4 oz (113 g) has 66 mg. Light bologna -- 2 oz (57 g) has 45 mg. Deli-cut turkey -- 2 oz (57 g) has 31 mg. Canned tuna -- 3.5 oz (99 g) has 31 mg. Bacon -- 1 oz (28 g) has 29 mg. Oysters and mussels (raw) -- 3.5 oz (99 g) has 25 mg. Mackerel -- 1 oz (28 g) has 22 mg. Trout -- 1 oz (28 g) has 20 mg. Pork sausage -- 1 link (1 oz or 28 g) has 17 mg. Salmon -- 1 oz (28 g) has 16 mg. Tilapia -- 1 oz (28 g) has 14 mg. Dairy Soft-serve ice cream --  cup (4 oz or 86 g) has 103 mg. Whole-milk yogurt -- 1 cup (8 oz or 245 g) has 29 mg. Cheddar cheese -- 1 oz (28 g) has 28 mg. American cheese -- 1 oz (28 g) has 28 mg. Whole milk -- 1 cup (8 oz or 250 mL) has 23 mg. 2% milk -- 1 cup (8 oz or 250 mL) has 18 mg. Cream cheese -- 1 tablespoon (Tbsp) (14.5 g) has 15 mg. Cottage cheese --  cup (4 oz or  113 g) has 14 mg. Low-fat (1%) milk -- 1 cup (8 oz or 250 mL) has 10 mg. Sour cream -- 1 Tbsp (12 g) has 8.5 mg. Low-fat yogurt -- 1 cup (8 oz or 245 g) has 8 mg. Nonfat Greek yogurt -- 1 cup (8 oz or 228 g) has 7 mg. Half-and-half cream -- 1 Tbsp (15 mL) has 5 mg. Fats and oils Cod liver oil -- 1 tablespoon (Tbsp) (13.6 g) has 82 mg. Butter -- 1 Tbsp (14 g) has 15 mg. Lard -- 1 Tbsp (12.8 g) has 14 mg. Bacon grease -- 1 Tbsp (12.9 g) has 14 mg. Mayonnaise -- 1 Tbsp (13.8 g) has 5-10 mg. Margarine -- 1 Tbsp (14 g) has 3-10 mg. The items listed above may not be a complete list of foods with cholesterol. Exact amounts of cholesterol in these foods may vary depending on specific ingredients and brands. Contact a dietitian for more information. What foods do not have cholesterol? Most plant-based foods do not have cholesterol unless you combine them with a food that has   cholesterol. Foods without cholesterol include: Grains and cereals. Vegetables. Fruits. Vegetable oils, such as olive, canola, and sunflower oil. Legumes, such as peas, beans, and lentils. Nuts and seeds. Egg whites. The items listed above may not be a complete list of foods that do not have cholesterol. Contact a dietitian for more information. Summary The body needs cholesterol in small amounts, but too much cholesterol can cause damage to the arteries and heart. Cholesterol is found in animal-based foods, such as meat, seafood, and dairy. Generally, low-fat dairy and lean meats have less cholesterol than full-fat dairy and fatty meats. This information is not intended to replace advice given to you by your health care provider. Make sure you discuss any questions you have with your health care provider. Document Revised: 06/22/2020 Document Reviewed: 06/22/2020 Elsevier Patient Education  2024 [employer] Inc.  

## 2023-03-19 NOTE — Progress Notes (Signed)
I,Victoria T Deloria Lair, CMA,acting as a Neurosurgeon for [name], MD.,have documented all relevant documentation on the behalf of [name], MD,as directed by  [name], MD while in the presence of [name], MD.  Subjective:  Patient ID: Dawn [name] , female    DOB: May 15, 1965 , 58 y.o.   MRN: [id number]  Chief Complaint  Patient presents with   Hyperlipidemia    HPI  Patient presents today for cholesterol follow up. She reports compliance with medications. She is currently taking atorvastatin 20mg  every other day.  This was started due to cardiac calcium score of 165. She was unable to tolerate daily due to  Denies headache, chest pain & sob.      Past Medical History:  Diagnosis Date   Acute meniscal tear of knee LEFT   Arthritis    Asthma    Constipation    Contact lens/glasses fitting    wears contacts or glasses   Environmental allergies    Gallbladder problem    History of gastric ulcer AS TEEN   History of stomach ulcers    History of viral pericarditis PROBABLE OR IDIOPATHIC PER D/C SUMMARY MARCH 2011   NO PROBLEMS SINCE   Joint pain    Lactose intolerance    Multiple food allergies    coconut   Osteoarthritis    Pericarditis    Primary localized osteoarthritis of right knee 11/25/2011   S/P Left Knee arthroscopy performed by Dr. Charlann Boxer in April 2013.    Seasonal allergies    Torn rotator cuff      Family History  Problem Relation Age of Onset   Cancer Brother    Asthma Son    Breast cancer Paternal Aunt      Current Outpatient Medications:    albuterol (VENTOLIN HFA) 108 (90 Base) MCG/ACT inhaler, Inhale 2 puffs into the lungs every 6 (six) hours as needed for wheezing., Disp: 18 g, Rfl: 5   aspirin EC 81 MG tablet, Take 1 tablet (81 mg total) by mouth daily. Swallow whole., Disp: , Rfl:    atorvastatin (LIPITOR) 20 MG tablet, Take 1 tablet (20 mg total) by mouth daily., Disp: 30 tablet, Rfl: 11   b complex vitamins tablet, Take 1  tablet by mouth daily., Disp: , Rfl:    Biotin 5000 MCG CAPS, Take by mouth every morning., Disp: , Rfl:    metoprolol tartrate (LOPRESSOR) 50 MG tablet, Take one (1) tablet by mouth ( 50 mg) 2 hours prior to CT scan, Disp: 1 tablet, Rfl: 0   Multiple Vitamins-Minerals (MULTIVITAMIN WITH MINERALS) tablet, Take by mouth daily. , Disp: , Rfl:    triamcinolone cream (KENALOG) 0.1 %, APPLY TO AFFECTED AREA TWICE DAILY AS NEEDED, Disp: 30 g, Rfl: 0   estradiol (ESTRACE) 1 MG tablet, TAKE 1 AND 1/2 TABLETS BY MOUTH EVERY DAY (Patient taking differently: Take 1 mg by mouth daily.), Disp: 140 tablet, Rfl: 3   Allergies  Allergen Reactions   Coconut (Cocos Nucifera) Anaphylaxis, Shortness Of Breath and Swelling    Coconut   Latex Swelling    AND BLISTERS   Sulfa Antibiotics Anaphylaxis     Review of Systems  Constitutional: Negative.   Respiratory: Negative.    Cardiovascular: Negative.   Gastrointestinal: Negative.   Psychiatric/Behavioral: Negative.    All other systems reviewed and are negative.    Today's Vitals   03/19/23 1627  BP: 124/82  Pulse: 83  Temp: 98.2 F (36.8  C)  SpO2: 98%  Weight: 165 lb 9.6 oz (75.1 kg)  Height: 5\' 4"  (1.626 m)   Body mass index is 28.43 kg/m.  Wt Readings from Last 3 Encounters:  03/19/23 165 lb 9.6 oz (75.1 kg)  02/04/23 171 lb 3.2 oz (77.7 kg)  01/13/23 162 lb (73.5 kg)     Objective:  Physical Exam Vitals and nursing note reviewed.  Constitutional:      Appearance: Normal appearance.  HENT:     Head: Normocephalic and atraumatic.  Eyes:     Extraocular Movements: Extraocular movements intact.  Cardiovascular:     Rate and Rhythm: Normal rate and regular rhythm.     Heart sounds: Normal heart sounds.  Pulmonary:     Effort: Pulmonary effort is normal.     Breath sounds: Normal breath sounds.  Musculoskeletal:     Cervical back: Normal range of motion.  Skin:    General: Skin is warm.  Neurological:     General: No focal  deficit present.     Mental Status: She is alert.  Psychiatric:        Mood and Affect: Mood normal.        Behavior: Behavior normal.         Assessment And Plan:  Pure hypercholesterolemia Assessment & Plan: Chronic, LDL goal is less than 70 due to underlying coronary artery calcifications. She prefers to come in later this week for labwork. Will make medication adjustments as needed.   Orders: -     Lipid panel; Future -     Hemoglobin A1c; Future -     ALT; Future  Coronary artery calcification of native artery Assessment & Plan: Cardiology input appreciated, most recent notes reviewed. Coronary CTA results reviewed in detail. Importance of medication compliance was discussed with the patient in detail. If she is not at goal with every other day dosing, may need to increase dose OR add other agents.   Myalgia -     CK; Future  Immunization due -     Flu vaccine trivalent PF, 6mos and older(Flulaval,Afluria,Fluarix,Fluzone)     Return if symptoms worsen or fail to improve.  Patient was given opportunity to ask questions. Patient verbalized understanding of the plan and was able to repeat key elements of the plan. All questions were answered to their satisfaction.    I, [name], MD, have reviewed all documentation for this visit. The documentation on 03/19/23 for the exam, diagnosis, procedures, and orders are all accurate and complete.   IF YOU HAVE BEEN REFERRED TO A SPECIALIST, IT MAY TAKE 1-2 WEEKS TO SCHEDULE/PROCESS THE REFERRAL. IF YOU HAVE NOT HEARD FROM US/SPECIALIST IN TWO WEEKS, PLEASE GIVE [place] A CALL AT [phone].   THE PATIENT IS ENCOURAGED TO PRACTICE SOCIAL DISTANCING DUE TO THE COVID-19 PANDEMIC.

## 2023-03-20 ENCOUNTER — Other Ambulatory Visit: Payer: Managed Care, Other (non HMO)

## 2023-03-20 LAB — LIPID PANEL
Chol/HDL Ratio: 2.3 {ratio} (ref 0.0–4.4)
Cholesterol, Total: 154 mg/dL (ref 100–199)
HDL: 68 mg/dL (ref >39)
LDL Chol Calc (NIH): 75 mg/dL (ref 0–99)
Triglycerides: 53 mg/dL (ref 0–149)
VLDL Cholesterol Cal: 11 mg/dL (ref 5–40)

## 2023-03-20 LAB — CK: Total CK: 145 U/L (ref 32–182)

## 2023-03-20 LAB — HEMOGLOBIN A1C
Est. average glucose Bld gHb Est-mCnc: 91 mg/dL
Hgb A1c MFr Bld: 4.8 % (ref 4.8–5.6)

## 2023-03-20 LAB — ALT: ALT: 16 [IU]/L (ref 0–32)

## 2023-03-25 ENCOUNTER — Encounter: Payer: Self-pay | Admitting: Internal Medicine

## 2023-03-25 DIAGNOSIS — M791 Myalgia, unspecified site: Secondary | ICD-10-CM | POA: Insufficient documentation

## 2023-03-25 DIAGNOSIS — I2510 Atherosclerotic heart disease of native coronary artery without angina pectoris: Secondary | ICD-10-CM | POA: Insufficient documentation

## 2023-03-25 NOTE — Assessment & Plan Note (Signed)
Chronic, LDL goal is less than 70 due to underlying coronary artery calcifications. She prefers to come in later this week for labwork. Will make medication adjustments as needed.

## 2023-03-25 NOTE — Assessment & Plan Note (Signed)
Cardiology input appreciated, most recent notes reviewed. Coronary CTA results reviewed in detail. Importance of medication compliance was discussed with the patient in detail. If she is not at goal with every other day dosing, may need to increase dose OR add other agents.

## 2023-04-20 ENCOUNTER — Ambulatory Visit
Admission: EM | Admit: 2023-04-20 | Discharge: 2023-04-20 | Disposition: A | Discharge status: Home / Self Care | Payer: Managed Care, Other (non HMO) | Attending: Family Medicine | Admitting: Family Medicine

## 2023-04-20 DIAGNOSIS — J040 Acute laryngitis: Secondary | ICD-10-CM

## 2023-04-20 DIAGNOSIS — B9789 Other viral agents as the cause of diseases classified elsewhere: Secondary | ICD-10-CM

## 2023-04-20 DIAGNOSIS — J988 Other specified respiratory disorders: Secondary | ICD-10-CM | POA: Diagnosis not present

## 2023-04-20 LAB — POCT RAPID STREP A (OFFICE): Rapid Strep A Screen: NEGATIVE

## 2023-04-20 MED ORDER — DEXAMETHASONE SODIUM PHOSPHATE 10 MG/ML IJ SOLN
10.0000 mg | Freq: Once | INTRAMUSCULAR | Status: AC
  Administered 2023-04-20: 10 mg via INTRAMUSCULAR

## 2023-04-20 MED ORDER — IBUPROFEN 600 MG PO TABS: 600.0000 mg | ORAL_TABLET | Freq: Four times a day (QID) | ORAL | 0 refills | Status: AC | PRN

## 2023-04-20 MED ORDER — PSEUDOEPHEDRINE HCL 30 MG PO TABS: 30.0000 mg | ORAL_TABLET | Freq: Three times a day (TID) | ORAL | 0 refills | Status: AC | PRN

## 2023-04-20 MED ORDER — CETIRIZINE HCL 10 MG PO TABS: 10.0000 mg | ORAL_TABLET | Freq: Every day | ORAL | 0 refills | Status: AC

## 2023-04-20 MED ORDER — PROMETHAZINE-DM 6.25-15 MG/5ML PO SYRP: 5.0000 mL | ORAL_SOLUTION | Freq: Three times a day (TID) | ORAL | 0 refills | Status: DC | PRN

## 2023-04-20 NOTE — Discharge Instructions (Signed)
 You have received a steroid injection for your laryngitis today. Otherwise, we will manage this as a viral illness. For sore throat or cough try using a honey-based tea. Use 3 teaspoons of honey with juice squeezed from half lemon. Place shaved pieces of ginger into 1/2-1 cup of water and warm over stove top. Then mix the ingredients and repeat every 4 hours as needed. Please take ibuprofen 600mg  every 6 hours with food alternating with OR taken together with Tylenol 500mg -650mg  every 6 hours for throat pain, fevers, aches and pains. Hydrate very well with at least 2 liters of water. Eat light meals such as soups (chicken and noodles, vegetable, chicken and wild rice).  Do not eat foods that you are allergic to.  Taking an antihistamine like Zyrtec (10mg  daily) can help against postnasal drainage, sinus congestion which can cause sinus pain, sinus headaches, throat pain, painful swallowing, coughing.  You can take this together with pseudoephedrine (Sudafed) at a dose of 30mg  3 times a day or twice daily as needed for the same kind of asal drip, congestion. Use cough syrup as needed.

## 2023-04-20 NOTE — ED Provider Notes (Signed)
 [facility]  Note:  This document was prepared using Conservation officer, historic buildings and may include unintentional dictation errors.  MRN: [id number] DOB: 03/18/1965  Subjective:   Dawn [name] is a 58 y.o. female presenting for 2 day history of throat pain, productive cough, hoarseness, body aches. No fever, chest pain, shob, wheezing. Has asthma. No smoking of any kind including cigarettes, cigars, vaping, marijuana use.    No current facility-administered medications for this encounter.  Current Outpatient Medications:    albuterol (VENTOLIN HFA) 108 (90 Base) MCG/ACT inhaler, Inhale 2 puffs into the lungs every 6 (six) hours as needed for wheezing., Disp: 18 g, Rfl: 5   aspirin EC 81 MG tablet, Take 1 tablet (81 mg total) by mouth daily. Swallow whole., Disp: , Rfl:    atorvastatin (LIPITOR) 20 MG tablet, Take 1 tablet (20 mg total) by mouth daily., Disp: 30 tablet, Rfl: 11   b complex vitamins tablet, Take 1 tablet by mouth daily., Disp: , Rfl:    Biotin 5000 MCG CAPS, Take by mouth every morning., Disp: , Rfl:    estradiol (ESTRACE) 1 MG tablet, TAKE 1 AND 1/2 TABLETS BY MOUTH EVERY DAY (Patient taking differently: Take 1 mg by mouth daily.), Disp: 140 tablet, Rfl: 3   metoprolol tartrate (LOPRESSOR) 50 MG tablet, Take one (1) tablet by mouth ( 50 mg) 2 hours prior to CT scan, Disp: 1 tablet, Rfl: 0   Multiple Vitamins-Minerals (MULTIVITAMIN WITH MINERALS) tablet, Take by mouth daily. , Disp: , Rfl:    triamcinolone cream (KENALOG) 0.1 %, APPLY TO AFFECTED AREA TWICE DAILY AS NEEDED, Disp: 30 g, Rfl: 0   Allergies  Allergen Reactions   Coconut (Cocos Nucifera) Anaphylaxis, Shortness Of Breath and Swelling    Coconut   Latex Swelling    AND BLISTERS   Sulfa Antibiotics Anaphylaxis    Past Medical History:  Diagnosis Date   Acute meniscal tear of knee LEFT   Arthritis    Asthma    Constipation    Contact lens/glasses fitting    wears  contacts or glasses   Environmental allergies    Gallbladder problem    History of gastric ulcer AS TEEN   History of stomach ulcers    History of viral pericarditis PROBABLE OR IDIOPATHIC PER D/C SUMMARY MARCH 2011   NO PROBLEMS SINCE   Joint pain    Lactose intolerance    Multiple food allergies    coconut   Osteoarthritis    Pericarditis    Primary localized osteoarthritis of right knee 11/25/2011   S/P Left Knee arthroscopy performed by Dr. Charlann Boxer in April 2013.    Seasonal allergies    Torn rotator cuff      Past Surgical History:  Procedure Laterality Date   APPENDECTOMY  1998   BILATERAL CARPAL TUNNEL RELEASE  1980'S   BREAST BIOPSY     BREAST REDUCTION SURGERY Bilateral 12/21/2012   Procedure: BILATERAL MAMMARY REDUCTION  (BREAST);  Surgeon: Etter Sjogren, MD;  Location: [facility];  Service: Plastics;  Laterality: Bilateral;   CHOLECYSTECTOMY  1992   KNEE ARTHROSCOPY  06/20/2011   Procedure: ARTHROSCOPY KNEE;  Surgeon: Shelda Pal, MD;  Location: [facility];  Service: Orthopedics;  Laterality: Left;  left knee scope   latex allergy patient blisters and swells   PARTIAL KNEE ARTHROPLASTY Right 03/03/2014   Procedure: RIGHT UNICOMPARTMENTAL KNEE ARTHROPLASTY;  Surgeon: Eulas Post, MD;  Location: [facility];  Service: Orthopedics;  Laterality: Right;   REDUCTION MAMMAPLASTY     TRANSTHORACIC ECHOCARDIOGRAM  04-26-2009   NORMAL LVSF/ EF 60-65% / LVDF NORMAL / NO PERICARDIAL EFFUSION   VAGINAL HYSTERECTOMY  03-18-1999    Family History  Problem Relation Age of Onset   Cancer Brother    Asthma Son    Breast cancer Paternal Aunt     Social History   Tobacco Use   Smoking status: Never   Smokeless tobacco: Never  Vaping Use   Vaping status: Never Used  Substance Use Topics   Alcohol use: No   Drug use: No    ROS   Objective:   Vitals: BP 128/78 (BP Location: Left Arm)   Pulse 78   Temp 98.7 F (37.1 C)  (Oral)   Resp 16   SpO2 97%   Physical Exam Constitutional:      General: She is not in acute distress.    Appearance: Normal appearance. She is well-developed and normal weight. She is not ill-appearing, toxic-appearing or diaphoretic.  HENT:     Head: Normocephalic and atraumatic.     Right Ear: Tympanic membrane, ear canal and external ear normal. No drainage or tenderness. No middle ear effusion. There is no impacted cerumen. Tympanic membrane is not erythematous or bulging.     Left Ear: Tympanic membrane, ear canal and external ear normal. No drainage or tenderness.  No middle ear effusion. There is no impacted cerumen. Tympanic membrane is not erythematous or bulging.     Nose: Nose normal. No congestion or rhinorrhea.     Mouth/Throat:     Mouth: Mucous membranes are moist. No oral lesions.     Pharynx: No pharyngeal swelling, oropharyngeal exudate, posterior oropharyngeal erythema or uvula swelling.     Tonsils: No tonsillar exudate or tonsillar abscesses. 0 on the right. 0 on the left.  Eyes:     General: No scleral icterus.       Right eye: No discharge.        Left eye: No discharge.     Extraocular Movements: Extraocular movements intact.     Right eye: Normal extraocular motion.     Left eye: Normal extraocular motion.     Conjunctiva/sclera: Conjunctivae normal.  Cardiovascular:     Rate and Rhythm: Normal rate and regular rhythm.     Heart sounds: Normal heart sounds. No murmur heard.    No friction rub. No gallop.  Pulmonary:     Effort: Pulmonary effort is normal. No respiratory distress.     Breath sounds: No stridor. No wheezing, rhonchi or rales.  Chest:     Chest wall: No tenderness.  Musculoskeletal:     Cervical back: Normal range of motion and neck supple.  Lymphadenopathy:     Cervical: No cervical adenopathy.  Skin:    General: Skin is warm and dry.  Neurological:     General: No focal deficit present.     Mental Status: She is alert and oriented  to person, place, and time.  Psychiatric:        Mood and Affect: Mood normal.        Behavior: Behavior normal.     Results for orders placed or performed during the hospital encounter of 04/20/23 (from the past 24 hours)  POCT rapid strep A     Status: Normal   Collection Time: 04/20/23  9:31 AM  Result Value Ref Range   Rapid Strep A Screen Negative  IM dexamethasone 10mg  administered today in clinic.    Assessment and Plan :   PDMP not reviewed this encounter.  1. Viral respiratory infection   2. Laryngitis    Patient requested aggressive management and that she is going to be traveling for work and needs to use her voice.  Recommended dexamethasone as above.  Use supportive care otherwise for viral respiratory infection.  Deferred imaging given clear cardiopulmonary exam, hemodynamically stable vital signs.  Counseled patient on potential for adverse effects with medications prescribed/recommended today, ER and return-to-clinic precautions discussed, patient verbalized understanding.      Wallis Bamberg, [place] 04/20/23 [phone]

## 2023-04-20 NOTE — ED Triage Notes (Signed)
 Pt c/o sore throat,cough sx started 2 days ago-prod cough x today-denies known fever-NAD-steady gait

## 2023-05-10 NOTE — Progress Notes (Unsigned)
 Cardiology Office Note:  .   Date:  05/13/2023 ID:  Dawn [name], DOB 10/17/1965, MRN [id number] PCP: Dorothyann Peng, MD [facility] Providers Cardiologist:  None   Patient Profile: .      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 Her CAC score is 169, placing her in the 95th percentile for age/sex matched controls. Was recently advised to start atorvastatin 20mg  for cholesterol management but admits she hasn't been taking it because she does not like to take medications. 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 She leads a generally healthy lifestyle, with regular exercise and a balanced diet. She packs food to take with her or seeks out healthy eating options when traveling.  She does not smoke or drink Etoh. She reports several occurrences of feeling significant shortness of breath, particularly in the mornings. At times it has been shortly after waking up, while at other times the SOB occurs while getting dressed. Symptoms severe enough to cause her to have to sit down and rest. No associated symptoms of chest pain, palpitations, n/v, or diaphoresis. No orthopnea, PND, edema, presyncope or syncope. No history of smoking or alcohol consumption.EKG revealed NSR at 65 bpm, minimal voltage criteria for LVH, cannot rule out prior inferior infarct, consistent with findings on previous EKGs from 11/2021 and 04/2016. Typically exercises several days per week including walking and lifting weights, however she suffered a concussion in September 2024 which has somewhat limited activity. No significant family history of CAD. ASCVD risk score is 3.2%. Due to concerning symptoms of shortness of breath, coronary CTA was  completed on 02/24/23 and revealed minimal (<25%) obstruction in LM and LAD, moderate TPV. She was advised to start aspirin 81 mg daily and to consider starting PCSK9i therapy for elevated LP(a). She wanted to work on improving her diet prior to starting.        History of Present Illness: .   Dawn [name] is a very pleasant 58 y.o. female  who is here today for follow-up of CAD.  She reports feeling well and has resumed her regular exercise routine after a recent illness and a concussion that occurred a few months ago. She has made significant lifestyle changes, including dietary modifications and weight loss, to improve her health.  Was already following a pretty healthy diet, but recently has been following a mostly vegetarian diet and has lost 10 lb. She aims to lose 15 more lbs. She exercises three to four days a week, incorporating weightlifting and high-intensity interval training (HIIT),as well as walking.  She has been more intentional about this as well.  We reviewed the findings of her coronary CTA and LP(a). She had pain in her legs on atorvastatin that resolved when she stopped it. Has occasional swelling in her knee due to a partial knee replacement.  He denies chest pain, palpitations, shortness of breath, orthopnea, PND, presyncope, syncope.  ROS: See HPI       Studies Reviewed: .          Risk Assessment/Calculations:             Physical Exam:   VS: BP 130/66   Pulse 66   Ht 5\' 4"  (1.626 m)   Wt 162 lb  12.8 oz (73.8 kg)   SpO2 98%   BMI 27.94 kg/m   Wt Readings from Last 3 Encounters:  05/13/23 162 lb 12.8 oz (73.8 kg)  03/19/23 165 lb 9.6 oz (75.1 kg)  02/04/23 171 lb 3.2 oz (77.7 kg)     GEN: Well nourished, well developed in no acute distress NECK: No JVD; No carotid bruits CARDIAC: RRR, no murmurs, rubs, gallops RESPIRATORY:  Clear to auscultation without rales, wheezing or rhonchi  ABDOMEN: Soft, non-tender, non-distended EXTREMITIES:  No edema; No  deformity     ASSESSMENT AND PLAN: .    CAD: Elevated coronary calcium score, along with symptoms of SOB that occurred with ADLs and additional risk factors of elevated LP(a) and history of hyperlipidemia led [place] to get CCTA. CT revealed minimal (< 25%) calcified plaque in left main, minimal plaque in LAD, D1 has minimal soft plaque proximally, no significant plaque in RCA or LCx.  CAC score is 165 (95th percentile). She is active with regular exercise and denies chest pain, dyspnea, or other symptoms concerning for angina. No further episodes of SOB. No indication for further ischemic evaluation at this time. We discussed the reason for taking aspirin, which she has discontinued. She will take this into consideration.  LDL is not quite at goal of 70 or lower.  She is agreeable to try rosuvastatin 5 mg 3 days a week. Advised her to notify us if she does not tolerate this. BP is well controlled without use of anti-hypertensive therapy. A1C 4.8 on 03/20/23. Encouraged continued healthy lifestyle following healthy diet and at least 150 minutes of moderate intensity exercise each week.  Shortness of breath: These episodes have resolved.  She is very active and is not having any shortness of breath.  TTE on 03/09/2023 revealed normal LVEF 60 to 65%, normal diastolic parameters, normal RV, trivial MR.  We reviewed these results in detail.  Advised her to notify us if she develops concerning cardiac symptoms.  Hyperlipidemia LDL goal < 70/Elevated LP(a): Elevated LP(a) at 101.4. She started atorvastatin 20 mg daily, but unfortunately had leg pain which resolved when she discontinued it.  Improvements on lipid panel completed 03/20/2023 with total cholesterol 154, triglycerides 53, HDL 68, LDL-C 75, improved from 129.  She is aware that some improvement may be a result of atorvastatin.  In the setting of elevated LP(a), she is willing to try rosuvastatin 5 mg 3 days a week.  She will consider PCSK9 inhibitor therapy for  the future but is not ready to start at this time. Continue healthy diet and regular physical activity.      Disposition: 1 year with Dr. Duke Salvia or me  Signed, Eligha Bridegroom, NP-C

## 2023-05-13 ENCOUNTER — Ambulatory Visit (INDEPENDENT_AMBULATORY_CARE_PROVIDER_SITE_OTHER): Payer: Managed Care, Other (non HMO) | Admitting: Nurse Practitioner

## 2023-05-13 ENCOUNTER — Encounter (HOSPITAL_BASED_OUTPATIENT_CLINIC_OR_DEPARTMENT_OTHER): Payer: Self-pay | Admitting: Nurse Practitioner

## 2023-05-13 VITALS — BP 130/66 | HR 66 | Ht 64.0 in | Wt 162.8 lb

## 2023-05-13 DIAGNOSIS — I2510 Atherosclerotic heart disease of native coronary artery without angina pectoris: Secondary | ICD-10-CM

## 2023-05-13 DIAGNOSIS — R0602 Shortness of breath: Secondary | ICD-10-CM | POA: Diagnosis not present

## 2023-05-13 DIAGNOSIS — E785 Hyperlipidemia, unspecified: Secondary | ICD-10-CM | POA: Diagnosis not present

## 2023-05-13 DIAGNOSIS — Z7189 Other specified counseling: Secondary | ICD-10-CM

## 2023-05-13 MED ORDER — ROSUVASTATIN CALCIUM 5 MG PO TABS: 5.0000 mg | ORAL_TABLET | ORAL | 3 refills | Status: DC

## 2023-05-13 NOTE — Patient Instructions (Signed)
 Medication Instructions:   START Rosuvastatin one (1) tablet by mouth ( 5 mg) three times weekly.   *If you need a refill on your cardiac medications before your next appointment, please call your pharmacy*   Lab Work:  None ordered.  If you have labs (blood work) drawn today and your tests are completely normal, you will receive your results only by: MyChart Message (if you have MyChart) OR A paper copy in the mail If you have any lab test that is abnormal or we need to change your treatment, we will call you to review the results.   Testing/Procedures:  None ordered.   Follow-Up: At [facility], you and your health needs are our priority.  As part of our continuing mission to provide you with exceptional heart care, we have created designated Provider Care Teams.  These Care Teams include your primary Cardiologist (physician) and Advanced Practice Providers (APPs -  Physician Assistants and Nurse Practitioners) who all work together to provide you with the care you need, when you need it.  We recommend signing up for the patient portal called "MyChart".  Sign up information is provided on this After Visit Summary.  MyChart is used to connect with patients for Virtual Visits (Telemedicine).  Patients are able to view lab/test results, encounter notes, upcoming appointments, etc.  Non-urgent messages can be sent to your provider as well.   To learn more about what you can do with MyChart, go to [email or web address].    Your next appointment:   1 year(s)  Provider:   Chilton Si, MD or Eligha Bridegroom, NP    Other Instructions  Your physician wants you to follow-up in:  1 year.  You will receive a reminder letter in the mail two months in advance. If you don't receive a letter, please call our office to schedule the follow-up appointment.\

## 2023-11-25 ENCOUNTER — Encounter: Payer: Self-pay | Admitting: Family Medicine

## 2023-11-25 ENCOUNTER — Other Ambulatory Visit: Payer: Self-pay

## 2023-11-25 ENCOUNTER — Ambulatory Visit (INDEPENDENT_AMBULATORY_CARE_PROVIDER_SITE_OTHER): Payer: Self-pay | Admitting: Family Medicine

## 2023-11-25 VITALS — BP 120/60 | HR 58 | Temp 98.4°F | Ht 64.0 in | Wt 172.0 lb

## 2023-11-25 DIAGNOSIS — E559 Vitamin D deficiency, unspecified: Secondary | ICD-10-CM | POA: Diagnosis not present

## 2023-11-25 DIAGNOSIS — R4189 Other symptoms and signs involving cognitive functions and awareness: Secondary | ICD-10-CM

## 2023-11-25 DIAGNOSIS — G8929 Other chronic pain: Secondary | ICD-10-CM

## 2023-11-25 DIAGNOSIS — Z23 Encounter for immunization: Secondary | ICD-10-CM | POA: Diagnosis not present

## 2023-11-25 DIAGNOSIS — N951 Menopausal and female climacteric states: Secondary | ICD-10-CM

## 2023-11-25 DIAGNOSIS — M25551 Pain in right hip: Secondary | ICD-10-CM

## 2023-11-25 MED ORDER — TRAZODONE HCL 50 MG PO TABS: 50.0000 mg | ORAL_TABLET | Freq: Every evening | ORAL | 1 refills | Status: AC | PRN

## 2023-11-25 MED ORDER — DICLOFENAC SODIUM 1 % EX GEL: 4.0000 g | Freq: Four times a day (QID) | CUTANEOUS | 1 refills | Status: AC

## 2023-11-25 MED ORDER — EPINEPHRINE 0.3 MG/0.3ML IJ SOAJ: 0.3000 mg | INTRAMUSCULAR | 1 refills | Status: AC | PRN

## 2023-11-25 NOTE — Progress Notes (Signed)
 I,Jameka J Llittleton, CMA,acting as a Neurosurgeon for [employer], NP.,have documented all relevant documentation on the behalf of [name], NP,as directed by  [name], NP while in the presence of [name], NP.  Subjective:  Patient ID: Dawn [name] , female    DOB: 09-May-1965 , 58 y.o.   MRN: [id number]  Chief Complaint  Patient presents with   hormones    Patient presents today for hormones. She feels like she has been very emotional.     HPI Discussed the use of AI scribe software for clinical note transcription with the patient, who gave verbal consent to proceed.  History of Present Illness      Dawn [name] is a 58 year old female who presents for travel vaccinations and evaluation of hormonal symptoms.  She is planning to travel to [place] in November and seeks advice on necessary vaccinations. She is unsure if she has received the influenza vaccine this year.  She experiences symptoms she attributes to hormonal changes, including cognitive fog, difficulty concentrating, emotional lability, and occasional warmth. She is currently using an Estradiol patch for hormone management, which was previously unavailable due to a national backorder. Despite resuming the patch, these symptoms persist. Her OB GYN suggested further evaluation for potential deficiencies beyond hormonal causes.  She describes chronic, intermittent right hip pain with a pain level of 2-3 out of 10. The pain is described as a 'nerve pain shooting' sensation, particularly when bending or after sitting for extended periods. She has not had any imaging studies for this issue. She manages the pain with turmeric and [place] for inflammation and has a high tolerance for pain, often avoiding medication. No foot pain is reported, and she experiences hip pain that flares up and is worse at night or after prolonged sitting. She reports sleeping late and using a pillow between her legs to alleviate  discomfort.  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 She recalls having an EpiPen  prescribed in the past but is unsure of the current status of her prescription.     Past Medical History:  Diagnosis Date   Acute meniscal tear of knee LEFT   Arthritis    Asthma    Constipation    Contact lens/glasses fitting    wears contacts or glasses   Environmental allergies    Gallbladder problem    History of gastric ulcer AS TEEN   History of stomach ulcers    History of viral pericarditis PROBABLE OR IDIOPATHIC PER D/C SUMMARY MARCH 2011   NO PROBLEMS SINCE   Joint pain    Lactose intolerance    Multiple food allergies    coconut   Osteoarthritis    Pericarditis    Primary localized osteoarthritis of right knee 11/25/2011   S/P Left Knee arthroscopy performed by Dr. Ernie in April 2013.    Seasonal allergies    Torn rotator cuff      Family History  Problem Relation Age of Onset   Cancer Brother    Asthma Son    Breast cancer Paternal Aunt      Current Outpatient Medications:    albuterol  (VENTOLIN  HFA) 108 (90 Base) MCG/ACT inhaler, Inhale 2 puffs into the lungs every 6 (six) hours as needed for wheezing., Disp: 18 g, Rfl: 5   atorvastatin  (LIPITOR) 20 MG tablet, Take 1 tablet (20 mg total) by mouth daily., Disp: 30 tablet, Rfl: 11  cetirizine  (ZYRTEC  ALLERGY) 10 MG tablet, Take 1 tablet (10 mg total) by mouth daily., Disp: 30 tablet, Rfl: 0   diclofenac Sodium (VOLTAREN) 1 % GEL, Apply 4 g topically 4 (four) times daily., Disp: 100 g, Rfl: 1   estradiol (VIVELLE-DOT) 0.075 MG/24HR, Place 1 patch onto the skin 2 (two) times a week., Disp: , Rfl:    ibuprofen  (ADVIL ) 600 MG tablet, Take 1 tablet (600 mg total) by mouth every 6 (six) hours as needed., Disp: 30 tablet, Rfl: 0   Multiple Vitamins-Minerals (MULTIVITAMIN WITH MINERALS) tablet, Take by mouth daily. , Disp: , Rfl:    pseudoephedrine  (SUDAFED) 30 MG tablet,  Take 1 tablet (30 mg total) by mouth every 8 (eight) hours as needed for congestion., Disp: 30 tablet, Rfl: 0   traZODone (DESYREL) 50 MG tablet, Take 1 tablet (50 mg total) by mouth at bedtime as needed for sleep., Disp: 90 tablet, Rfl: 1   b complex vitamins tablet, Take 1 tablet by mouth daily. (Patient not taking: Reported on 11/25/2023), Disp: , Rfl:    Biotin 5000 MCG CAPS, Take by mouth every morning. (Patient not taking: Reported on 11/25/2023), Disp: , Rfl:    EPINEPHrine  0.3 mg/0.3 mL IJ SOAJ injection, Inject 0.3 mg into the muscle as needed., Disp: 1 each, Rfl: 1   estradiol (ESTRACE) 1 MG tablet, TAKE 1 AND 1/2 TABLETS BY MOUTH EVERY DAY (Patient not taking: Reported on 11/25/2023), Disp: 140 tablet, Rfl: 3   Allergies  Allergen Reactions   Coconut (Cocos Nucifera) Anaphylaxis, Shortness Of Breath and Swelling    Coconut   Coconut Oil Anaphylaxis, Other (See Comments), Shortness Of Breath and Swelling    Coconut   Latex Swelling, Rash, Hives, Itching and Other (See Comments)    AND BLISTERS   Sulfa Antibiotics Anaphylaxis and Swelling     Review of Systems  Constitutional: Negative.   HENT: Negative.    Respiratory: Negative.    Cardiovascular: Negative.   Musculoskeletal:  Positive for arthralgias.  Neurological:        Brain fog  Psychiatric/Behavioral:  Positive for sleep disturbance.      Today's Vitals   11/25/23 1144  BP: 120/60  Pulse: (!) 58  Temp: 98.4 F (36.9 C)  TempSrc: Oral  Weight: 172 lb (78 kg)  Height: 5' 4 (1.626 m)   Body mass index is 29.52 kg/m.  Wt Readings from Last 3 Encounters:  11/25/23 172 lb (78 kg)  05/13/23 162 lb 12.8 oz (73.8 kg)  03/19/23 165 lb 9.6 oz (75.1 kg)    The 10-year ASCVD risk score (Arnett DK, et al., 2019) is: 3%   Values used to calculate the score:     Age: 22 years     Clincally relevant sex: Female     Is Non-Hispanic African American: Yes     Diabetic: No     Tobacco smoker: No     Systolic Blood  Pressure: 120 mmHg     Is BP treated: No     HDL Cholesterol: 68 mg/dL     Total Cholesterol: 154 mg/dL  Objective:  Physical Exam Constitutional:      Appearance: Normal appearance.  HENT:     Head: Normocephalic.  Cardiovascular:     Rate and Rhythm: Normal rate and regular rhythm.     Pulses: Normal pulses.     Heart sounds: Normal heart sounds.  Pulmonary:     Effort: Pulmonary effort is normal.     Breath  sounds: Normal breath sounds.  Abdominal:     General: Bowel sounds are normal.  Neurological:     Mental Status: She is alert.         Assessment And Plan:  Sleeplessness associated with menopause -     BMP8+eGFR -     CBC with Differential/Platelet -     traZODone HCl; Take 1 tablet (50 mg total) by mouth at bedtime as needed for sleep.  Dispense: 90 tablet; Refill: 1  Vitamin D  deficiency -     VITAMIN D  25 Hydroxy (Vit-D Deficiency, Fractures)  Chronic right hip pain -     Diclofenac Sodium; Apply 4 g topically 4 (four) times daily.  Dispense: 100 g; Refill: 1  Need for influenza vaccination -     Flu vaccine trivalent PF, 6mos and older(Flulaval,Afluria,Fluarix,Fluzone)  Brain fog -     Vitamin B12   Assessment & Plan Travel Vaccinations She is planning to travel to [place] and needs to update her vaccinations. - Administer flu vaccine today. - Advise consultation with health department for typhoid and hepatitis A vaccines. - Recommend COVID vaccination if not up to date.  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 No current EpiPen  prescription. - Verify EpiPen  prescription status and issue new prescription if necessary.  Right Hip Pain Chronic right hip pain with intermittent flares, exacerbated by prolonged sitting or standing. No prior imaging. - Prescribe topical ointment for pain relief. - Consider x-ray if symptoms persist or worsen.  Menopausal Symptoms Experiencing emotional lability, foggy brain, and mild  hot flashes despite estradiol patch. Possible hormonal changes or deficiencies. - Order vitamin D  and B12 levels. - Perform depression and anxiety screening.    Return if symptoms worsen or fail to improve, for keep next appt.  Patient was given opportunity to ask questions. Patient verbalized understanding of the plan and was able to repeat key elements of the plan. All questions were answered to their satisfaction.   I, [name], NP, have reviewed all documentation for this visit. The documentation on 12/09/2023 for the exam, diagnosis, procedures, and orders are all accurate and complete.    I, [name], NP, have reviewed all documentation for this visit. The documentation on  for the exam, diagnosis, procedures, and orders are all accurate and complete.    IF YOU HAVE BEEN REFERRED TO A SPECIALIST, IT MAY TAKE 1-2 WEEKS TO SCHEDULE/PROCESS THE REFERRAL. IF YOU HAVE NOT HEARD FROM US /SPECIALIST IN TWO WEEKS, PLEASE GIVE US  A CALL AT [phone].

## 2023-11-26 LAB — BMP8+EGFR
BUN/Creatinine Ratio: 22 (ref 12–28)
BUN: 14 mg/dL (ref 8–27)
CO2: 22 mmol/L (ref 20–29)
Calcium: 9.4 mg/dL (ref 8.7–10.3)
Chloride: 103 mmol/L (ref 96–106)
Creatinine, Ser: 0.65 mg/dL (ref 0.57–1.00)
Glucose: 70 mg/dL (ref 70–99)
Potassium: 4.4 mmol/L (ref 3.5–5.2)
Sodium: 139 mmol/L (ref 134–144)
eGFR: 101 mL/min/1.73 (ref >59)

## 2023-11-26 LAB — CBC WITH DIFFERENTIAL/PLATELET
Basophils Absolute: 0.1 x10E3/uL (ref 0.0–0.2)
Basos: 1 %
EOS (ABSOLUTE): 0.1 x10E3/uL (ref 0.0–0.4)
Eos: 2 %
Hematocrit: 39.2 % (ref 34.0–46.6)
Hemoglobin: 12.3 g/dL (ref 11.1–15.9)
Immature Grans (Abs): 0 x10E3/uL (ref 0.0–0.1)
Immature Granulocytes: 0 %
Lymphocytes Absolute: 2 x10E3/uL (ref 0.7–3.1)
Lymphs: 29 %
MCH: 31.4 pg (ref 26.6–33.0)
MCHC: 31.4 g/dL — ABNORMAL LOW (ref 31.5–35.7)
MCV: 100 fL — ABNORMAL HIGH (ref 79–97)
Monocytes Absolute: 0.3 x10E3/uL (ref 0.1–0.9)
Monocytes: 4 %
Neutrophils Absolute: 4.4 x10E3/uL (ref 1.4–7.0)
Neutrophils: 64 %
Platelets: 256 x10E3/uL (ref 150–450)
RBC: 3.92 x10E6/uL (ref 3.77–5.28)
RDW: 11.9 % (ref 11.7–15.4)
WBC: 6.9 x10E3/uL (ref 3.4–10.8)

## 2023-11-26 LAB — VITAMIN D 25 HYDROXY (VIT D DEFICIENCY, FRACTURES): Vit D, 25-Hydroxy: 35 ng/mL (ref 30.0–100.0)

## 2023-11-26 LAB — VITAMIN B12: Vitamin B-12: 1921 pg/mL — ABNORMAL HIGH (ref 232–1245)

## 2023-12-10 ENCOUNTER — Ambulatory Visit: Payer: Self-pay | Admitting: Family Medicine

## 2023-12-10 DIAGNOSIS — N951 Menopausal and female climacteric states: Secondary | ICD-10-CM | POA: Insufficient documentation

## 2023-12-10 DIAGNOSIS — G8929 Other chronic pain: Secondary | ICD-10-CM | POA: Insufficient documentation

## 2023-12-10 DIAGNOSIS — R4189 Other symptoms and signs involving cognitive functions and awareness: Secondary | ICD-10-CM | POA: Insufficient documentation

## 2023-12-10 DIAGNOSIS — Z23 Encounter for immunization: Secondary | ICD-10-CM | POA: Insufficient documentation

## 2023-12-10 NOTE — Progress Notes (Signed)
 Your vitamin D  is normal, but your Vitamin B12 is above normal. Do you take a daily supplement of B12? Please stop it for the next 2 weeks.  Thank you!

## 2023-12-10 NOTE — Assessment & Plan Note (Signed)
 Chronic right hip pain with intermittent flares, exacerbated by prolonged sitting or standing.  No prior imaging. - Prescribe topical ointment for pain relief.

## 2023-12-10 NOTE — Assessment & Plan Note (Signed)
 Experiencing emotional lability, foggy brain, and mild hot flashes despite estradiol patch.  - Order vitamin D  and B12 levels.

## 2023-12-17 ENCOUNTER — Ambulatory Visit: Payer: 59 | Admitting: Internal Medicine

## 2023-12-17 ENCOUNTER — Encounter: Payer: Self-pay | Admitting: Internal Medicine

## 2023-12-17 VITALS — BP 122/80 | HR 78 | Temp 97.7°F | Ht 64.0 in | Wt 174.4 lb

## 2023-12-17 DIAGNOSIS — E559 Vitamin D deficiency, unspecified: Secondary | ICD-10-CM

## 2023-12-17 DIAGNOSIS — N951 Menopausal and female climacteric states: Secondary | ICD-10-CM

## 2023-12-17 DIAGNOSIS — J301 Allergic rhinitis due to pollen: Secondary | ICD-10-CM

## 2023-12-17 DIAGNOSIS — M791 Myalgia, unspecified site: Secondary | ICD-10-CM

## 2023-12-17 DIAGNOSIS — I2584 Coronary atherosclerosis due to calcified coronary lesion: Secondary | ICD-10-CM | POA: Diagnosis not present

## 2023-12-17 DIAGNOSIS — I2510 Atherosclerotic heart disease of native coronary artery without angina pectoris: Secondary | ICD-10-CM

## 2023-12-17 DIAGNOSIS — E7800 Pure hypercholesterolemia, unspecified: Secondary | ICD-10-CM | POA: Diagnosis not present

## 2023-12-17 DIAGNOSIS — Z0000 Encounter for general adult medical examination without abnormal findings: Secondary | ICD-10-CM

## 2023-12-17 DIAGNOSIS — F5104 Psychophysiologic insomnia: Secondary | ICD-10-CM

## 2023-12-17 MED ORDER — CYCLOBENZAPRINE HCL 5 MG PO TABS: 5.0000 mg | ORAL_TABLET | Freq: Three times a day (TID) | ORAL | 0 refills | Status: AC | PRN

## 2023-12-17 MED ORDER — ASPIRIN 81 MG PO TBEC: 81.0000 mg | DELAYED_RELEASE_TABLET | Freq: Every day | ORAL | Status: AC

## 2023-12-17 NOTE — Progress Notes (Signed)
 I,Dawn [name], CMA,acting as a neurosurgeon for Dawn Dawn Slocumb, MD.,have documented all relevant documentation on the behalf of Dawn Dawn Slocumb, MD,as directed by  Dawn Dawn Slocumb, MD while in the presence of Dawn Dawn Slocumb, MD.  Subjective:    Patient ID: [name] , female    DOB: 1966-02-04 , 58 y.o.   MRN: [id number]  Chief Complaint  Patient presents with   Annual Exam    Patient presents today for a physical. Patient is followed by [facility] for her GYN care. She reports compliance with medications. Denies headache, chest pain & sob. Letter sent to gyn for mammogram result.    Hyperlipidemia    HPI Discussed the use of AI scribe software for clinical note transcription with the patient, who gave verbal consent to proceed.  History of Present Illness Dawn [name] is a 58 year old female with coronary artery disease who presents for a routine physical exam.  She has been inconsistent with her atorvastatin  20 mg use due to leg discomfort, leading her to take it every other night and eventually stop. She resumed taking it nightly before the appointment, which again caused leg discomfort. She takes Coenzyme Q10 and fish oil daily. She has been prescribed aspirin  by her cardiologist but does not take it daily.  She experiences year-round allergies and alternates between over-the-counter medications when one stops working. She is currently taking Zyrtec .  She is on an estrogen patch, which is on backorder, and has been rationing her last patch. She has checked multiple pharmacies and is considering smaller pharmacies for availability. She is not using vaginal tablets or oral estrogen.  She has been prescribed trazodone for sleep but reports it only helped for the first few nights. She travels frequently for work, which may affect her sleep. She has resumed taking magnesium  at night.  She does not take vitamin D  currently but had a recent injection.  Her vitamin D  levels were checked and found to be high.  She reports leg pain, particularly when lying down, which she associates with her L4 and L5 vertebrae. She has not engaged in physical therapy recently but is open to it. She experiences pain in her hip and buttock, especially after prolonged sitting.  She has a history of elevated MCV, which she attributes to a recent B12 injection at a conference. She does not consume alcohol but smokes.  She has gained weight and acknowledges being 'off her game' with exercise. She has started doing calisthenics and plans to return to regular exercise. She drinks plenty of water and is working on [employer].  She had a mammogram in March 2025 at [facility], but the results have not been sent over.   Past Medical History:  Diagnosis Date   Acute meniscal tear of knee LEFT   Arthritis    Asthma    Constipation    Contact lens/glasses fitting    wears contacts or glasses   Environmental allergies    Gallbladder problem    History of gastric ulcer AS TEEN   History of stomach ulcers    History of viral pericarditis PROBABLE OR IDIOPATHIC PER D/C SUMMARY MARCH 2011   NO PROBLEMS SINCE   Joint pain    Lactose intolerance    Multiple food allergies    coconut   Osteoarthritis    Pericarditis    Primary localized osteoarthritis of right knee 11/25/2011   S/P Left Knee arthroscopy performed  by Dr. Ernie in April 2013.    Seasonal allergies    Torn rotator cuff      Family History  Problem Relation Age of Onset   Cancer Brother    Asthma Son    Breast cancer Paternal Aunt      Current Outpatient Medications:    albuterol  (VENTOLIN  HFA) 108 (90 Base) MCG/ACT inhaler, Inhale 2 puffs into the lungs every 6 (six) hours as needed for wheezing., Disp: 18 g, Rfl: 5   aspirin  EC 81 MG tablet, Take 1 tablet (81 mg total) by mouth daily. Swallow whole., Disp: , Rfl:    atorvastatin  (LIPITOR) 20 MG tablet, Take 1 tablet (20 mg  total) by mouth daily., Disp: 30 tablet, Rfl: 11   cetirizine  (ZYRTEC  ALLERGY) 10 MG tablet, Take 1 tablet (10 mg total) by mouth daily., Disp: 30 tablet, Rfl: 0   cyclobenzaprine  (FLEXERIL ) 5 MG tablet, Take 1 tablet (5 mg total) by mouth 3 (three) times daily as needed for muscle spasms., Disp: 30 tablet, Rfl: 0   diclofenac Sodium (VOLTAREN) 1 % GEL, Apply 4 g topically 4 (four) times daily., Disp: 100 g, Rfl: 1   EPINEPHrine  0.3 mg/0.3 mL IJ SOAJ injection, Inject 0.3 mg into the muscle as needed., Disp: 1 each, Rfl: 1   estradiol (VIVELLE-DOT) 0.075 MG/24HR, Place 1 patch onto the skin 2 (two) times a week., Disp: , Rfl:    ibuprofen  (ADVIL ) 600 MG tablet, Take 1 tablet (600 mg total) by mouth every 6 (six) hours as needed., Disp: 30 tablet, Rfl: 0   Multiple Vitamins-Minerals (MULTIVITAMIN WITH MINERALS) tablet, Take by mouth daily. , Disp: , Rfl:    pseudoephedrine  (SUDAFED) 30 MG tablet, Take 1 tablet (30 mg total) by mouth every 8 (eight) hours as needed for congestion., Disp: 30 tablet, Rfl: 0   traZODone (DESYREL) 50 MG tablet, Take 1 tablet (50 mg total) by mouth at bedtime as needed for sleep., Disp: 90 tablet, Rfl: 1   b complex vitamins tablet, Take 1 tablet by mouth daily. (Patient not taking: Reported on 12/17/2023), Disp: , Rfl:    Biotin 5000 MCG CAPS, Take by mouth every morning. (Patient not taking: Reported on 12/17/2023), Disp: , Rfl:    Allergies  Allergen Reactions   Coconut (Cocos Nucifera) Anaphylaxis, Shortness Of Breath and Swelling    Coconut   Coconut Oil Anaphylaxis, Other (See Comments), Shortness Of Breath and Swelling    Coconut   Latex Swelling, Rash, Hives, Itching and Other (See Comments)    AND BLISTERS   Sulfa Antibiotics Anaphylaxis and Swelling      The patient states she uses status post hysterectomy for birth control. No LMP recorded. Patient has had a hysterectomy.. Negative for Dysmenorrhea. Negative for: breast discharge, breast lump(s), breast  pain and breast self exam. Associated symptoms include abnormal vaginal bleeding. Pertinent negatives include abnormal bleeding (hematology), anxiety, decreased libido, depression, difficulty falling sleep, dyspareunia, history of infertility, nocturia, sexual dysfunction, sleep disturbances, urinary incontinence, urinary urgency, vaginal discharge and vaginal itching. Diet regular.The patient states her exercise level is  intermittent.  . The patient's tobacco use is:  Social History   Tobacco Use  Smoking Status Never  Smokeless Tobacco Never  . She has been exposed to passive smoke. The patient's alcohol use is:  Social History   Substance and Sexual Activity  Alcohol Use No    Review of Systems  Constitutional: Negative.   HENT: Negative.    Eyes: Negative.  Respiratory: Negative.    Cardiovascular: Negative.   Gastrointestinal: Negative.   Endocrine: Negative.   Genitourinary: Negative.   Musculoskeletal: Negative.   Skin: Negative.   Allergic/Immunologic: Negative.   Neurological: Negative.   Hematological: Negative.   Psychiatric/Behavioral: Negative.       Today's Vitals   12/17/23 0935  BP: 122/80  Pulse: 78  Temp: 97.7 F (36.5 C)  SpO2: 98%  Weight: 174 lb 6.4 oz (79.1 kg)  Height: 5' 4 (1.626 m)   Body mass index is 29.94 kg/m.  Wt Readings from Last 3 Encounters:  12/17/23 174 lb 6.4 oz (79.1 kg)  11/25/23 172 lb (78 kg)  05/13/23 162 lb 12.8 oz (73.8 kg)     Objective:  Physical Exam Vitals and nursing note reviewed.  Constitutional:      Appearance: Normal appearance.  HENT:     Head: Normocephalic and atraumatic.     Right Ear: Tympanic membrane, ear canal and external ear normal.     Left Ear: Tympanic membrane, ear canal and external ear normal.     Nose: Nose normal.     Mouth/Throat:     Mouth: Mucous membranes are moist.     Pharynx: Oropharynx is clear.  Eyes:     Extraocular Movements: Extraocular movements intact.      Conjunctiva/sclera: Conjunctivae normal.     Pupils: Pupils are equal, round, and reactive to light.  Cardiovascular:     Rate and Rhythm: Normal rate and regular rhythm.     Pulses: Normal pulses.     Heart sounds: Normal heart sounds.  Pulmonary:     Effort: Pulmonary effort is normal.     Breath sounds: Normal breath sounds.  Abdominal:     General: Abdomen is flat. Bowel sounds are normal.     Palpations: Abdomen is soft.  Genitourinary:    Comments: deferred Musculoskeletal:        General: Normal range of motion.     Cervical back: Normal range of motion and neck supple.  Skin:    General: Skin is warm and dry.     Comments: Hyperpigmentation base of neck, no vesicular lesions noted  Neurological:     General: No focal deficit present.     Mental Status: She is alert and oriented to person, place, and time.  Psychiatric:        Mood and Affect: Mood normal.        Behavior: Behavior normal.         Assessment And Plan:     Routine general medical examination at health care facility Assessment & Plan: A full exam was performed.  Importance of monthly self breast exams was discussed with the patient.  She is advised to get 30-45 minutes of regular exercise, no less than four to five days per week. Both weight-bearing and aerobic exercises are recommended.  She is advised to follow a healthy diet with at least six fruits/veggies per day, decrease intake of red meat and other saturated fats and to increase fish intake to twice weekly.  Meats/fish should not be fried -- baked, boiled or broiled is preferable. It is also important to cut back on your sugar intake.  Be sure to read labels - try to avoid anything with added sugar, high fructose corn syrup or other sweeteners.  If you must use a sweetener, you can try stevia or monkfruit.  It is also important to avoid artificially sweetened foods/beverages and diet drinks. Lastly, wear SPF  50 sunscreen on exposed skin and when in  direct sunlight for an extended period of time.  Be sure to avoid fast food restaurants and aim for at least 60 ounces of water daily.      Orders: -     Lipid panel -     Hepatic function panel  Coronary artery calcification of native artery Assessment & Plan: Hypercholesterolemia managed with atorvastatin , causing myalgia. Cholesterol levels improved with reduced atorvastatin  frequency. Discussed alternative statins if issues persist. Emphasized importance of aspirin  therapy. - Check cholesterol levels. - Continue atorvastatin  every other day. - Continue aspirin  every other day. - Continue Coenzyme Q10 daily. - Order liver function test. - Order muscle enzyme test.   Pure hypercholesterolemia Assessment & Plan: Hypercholesterolemia managed with atorvastatin , causing myalgia. Cholesterol levels improved with reduced atorvastatin  frequency. Discussed alternative statins if issues persist. Emphasized importance of aspirin  therapy. - Check cholesterol levels. - Continue atorvastatin  every other day. - Continue aspirin  every other day. - Continue Coenzyme Q10 daily. - Order liver function test. - Order muscle enzyme test.   Myalgia Assessment & Plan: Intermittent myalgia and sciatica exacerbated by prolonged sitting. Discussed physical therapy and stretching exercises. Considered cyclobenzaprine  for muscle relaxation during flare-ups. - Prescribe cyclobenzaprine  5 mg, to be taken at night as needed. - Advise stretching exercises several times a day. - Apply topical analgesics like Bengay at night if needed.  Orders: -     CK  Chronic insomnia Assessment & Plan: Chronic insomnia with limited relief from trazodone. Discussed regular dosing importance. Considered stress and travel as contributing factors. - Continue trazodone with regular nightly dosing. - Consider alternative sleep aids if trazodone remains ineffective.   Symptomatic menopausal or female climacteric  states Assessment & Plan: Current use of estrogen patch with supply issues. Discussed alternative pharmacies and reducing patch frequency to conserve supply. - Contact smaller pharmacies like [address] [facility] and [facility] for estrogen patch availability. - Reduce patch application to once a week until supply stabilizes.   Non-seasonal allergic rhinitis due to pollen Assessment & Plan: Chronic allergic rhinitis managed with over-the-counter antihistamines. - Continue current antihistamine regimen as needed.   Vitamin D  deficiency Assessment & Plan: Previous vitamin D  levels normal but not optimal. Discussed benefits of supplementation for overall health and muscle symptoms. - Recommend vitamin D  1000-2000 IU daily in gel capsule form.   Other orders -     Aspirin ; Take 1 tablet (81 mg total) by mouth daily. Swallow whole. -     Cyclobenzaprine  HCl; Take 1 tablet (5 mg total) by mouth 3 (three) times daily as needed for muscle spasms.  Dispense: 30 tablet; Refill: 0  Adult Wellness Visit Routine wellness visit focused on medication adherence, lifestyle modifications, self-care, and stress management. Plans for stress management retreat in [place]. - Schedule six-month follow-up. - Schedule next year's physical. Return for 1 year HM, 6 month chol f/u.SABRA Patient was given opportunity to ask questions. Patient verbalized understanding of the plan and was able to repeat key elements of the plan. All questions were answered to their satisfaction.   I, Dawn Dawn Slocumb, MD, have reviewed all documentation for this visit. The documentation on 12/17/23 for the exam, diagnosis, procedures, and orders are all accurate and complete.

## 2023-12-17 NOTE — Patient Instructions (Addendum)
 [facility] - [place], [place] [employer] - [place], [place]  Vitamin D  1000 - 2000 units capsules once daily  Health Maintenance, Female Adopting a healthy lifestyle and getting preventive care are important in promoting health and wellness. Ask your health care provider about: The right schedule for you to have regular tests and exams. Things you can do on your own to prevent diseases and keep yourself healthy. What should I know about diet, weight, and exercise? Eat a healthy diet  Eat a diet that includes plenty of vegetables, fruits, low-fat dairy products, and lean protein. Do not eat a lot of foods that are high in solid fats, added sugars, or sodium. Maintain a healthy weight Body mass index (BMI) is used to identify weight problems. It estimates body fat based on height and weight. Your health care provider can help determine your BMI and help you achieve or maintain a healthy weight. Get regular exercise Get regular exercise. This is one of the most important things you can do for your health. Most adults should: Exercise for at least 150 minutes each week. The exercise should increase your heart rate and make you sweat (moderate-intensity exercise). Do strengthening exercises at least twice a week. This is in addition to the moderate-intensity exercise. Spend less time sitting. Even light physical activity can be beneficial. Watch cholesterol and blood lipids Have your blood tested for lipids and cholesterol at 60 years of age, then have this test every 5 years. Have your cholesterol levels checked more often if: Your lipid or cholesterol levels are high. You are older than 60 years of age. You are at high risk for heart disease. What should I know about cancer screening? Depending on your health history and family history, you may need to have cancer screening at various ages. This may include screening for: Breast cancer. Cervical cancer. Colorectal  cancer. Skin cancer. Lung cancer. What should I know about heart disease, diabetes, and high blood pressure? Blood pressure and heart disease High blood pressure causes heart disease and increases the risk of stroke. This is more likely to develop in people who have high blood pressure readings or are overweight. Have your blood pressure checked: Every 3-5 years if you are 16-13 years of age. Every year if you are 28 years old or older. Diabetes Have regular diabetes screenings. This checks your fasting blood sugar level. Have the screening done: Once every three years after age 74 if you are at a normal weight and have a low risk for diabetes. More often and at a younger age if you are overweight or have a high risk for diabetes. What should I know about preventing infection? Hepatitis B If you have a higher risk for hepatitis B, you should be screened for this virus. Talk with your health care provider to find out if you are at risk for hepatitis B infection. Hepatitis C Testing is recommended for: Everyone born from [birth year] through 1967. Anyone with known risk factors for hepatitis C. Sexually transmitted infections (STIs) Get screened for STIs, including gonorrhea and chlamydia, if: You are sexually active and are younger than 60 years of age. You are older than 60 years of age and your health care provider tells you that you are at risk for this type of infection. Your sexual activity has changed since you were last screened, and you are at increased risk for chlamydia or gonorrhea. Ask your health care provider if you are at risk. Ask your health care provider  about whether you are at high risk for HIV. Your health care provider may recommend a prescription medicine to help prevent HIV infection. If you choose to take medicine to prevent HIV, you should first get tested for HIV. You should then be tested every 3 months for as long as you are taking the medicine. Pregnancy If you are  about to stop having your period (premenopausal) and you may become pregnant, seek counseling before you get pregnant. Take 400 to 800 micrograms (mcg) of folic acid  every day if you become pregnant. Ask for birth control (contraception) if you want to prevent pregnancy. Osteoporosis and menopause Osteoporosis is a disease in which the bones lose minerals and strength with aging. This can result in bone fractures. If you are 9 years old or older, or if you are at risk for osteoporosis and fractures, ask your health care provider if you should: Be screened for bone loss. Take a calcium  or vitamin D  supplement to lower your risk of fractures. Be given hormone replacement therapy (HRT) to treat symptoms of menopause. Follow these instructions at home: Alcohol use Do not drink alcohol if: Your health care provider tells you not to drink. You are pregnant, may be pregnant, or are planning to become pregnant. If you drink alcohol: Limit how much you have to: 0-1 drink a day. Know how much alcohol is in your drink. In the [place] one drink equals one 12 oz bottle of beer (355 mL), one 5 oz glass of wine (148 mL), or one 1 oz glass of hard liquor (44 mL). Lifestyle Do not use any products that contain nicotine or tobacco. These products include cigarettes, chewing tobacco, and vaping devices, such as e-cigarettes. If you need help quitting, ask your health care provider. Do not use street drugs. Do not share needles. Ask your health care provider for help if you need support or information about quitting drugs. General instructions Schedule regular health, dental, and eye exams. Stay current with your vaccines. Tell your health care provider if: You often feel depressed. You have ever been abused or do not feel safe at home. Summary Adopting a healthy lifestyle and getting preventive care are important in promoting health and wellness. Follow your health care provider's instructions about  healthy diet, exercising, and getting tested or screened for diseases. Follow your health care provider's instructions on monitoring your cholesterol and blood pressure. This information is not intended to replace advice given to you by your health care provider. Make sure you discuss any questions you have with your health care provider. Document Revised: 07/02/2020 Document Reviewed: 07/02/2020 Elsevier Patient Education  2024 [employer].

## 2023-12-18 LAB — HEPATIC FUNCTION PANEL
ALT: 23 IU/L (ref 0–32)
AST: 26 IU/L (ref 0–40)
Albumin: 4.5 g/dL (ref 3.8–4.9)
Alkaline Phosphatase: 73 IU/L (ref 49–135)
Bilirubin Total: 0.4 mg/dL (ref 0.0–1.2)
Bilirubin, Direct: 0.13 mg/dL (ref 0.00–0.40)
Total Protein: 7.9 g/dL (ref 6.0–8.5)

## 2023-12-18 LAB — LIPID PANEL
Chol/HDL Ratio: 2.5 ratio (ref 0.0–4.4)
Cholesterol, Total: 204 mg/dL — ABNORMAL HIGH (ref 100–199)
HDL: 83 mg/dL (ref >39)
LDL Chol Calc (NIH): 112 mg/dL — ABNORMAL HIGH (ref 0–99)
Triglycerides: 51 mg/dL (ref 0–149)
VLDL Cholesterol Cal: 9 mg/dL (ref 5–40)

## 2023-12-18 LAB — CK: Total CK: 155 U/L (ref 32–182)

## 2023-12-21 ENCOUNTER — Ambulatory Visit: Payer: Self-pay | Admitting: Internal Medicine

## 2023-12-21 DIAGNOSIS — F5104 Psychophysiologic insomnia: Secondary | ICD-10-CM | POA: Insufficient documentation

## 2023-12-21 NOTE — Assessment & Plan Note (Signed)
 Chronic allergic rhinitis managed with over-the-counter antihistamines. - Continue current antihistamine regimen as needed.

## 2023-12-21 NOTE — Assessment & Plan Note (Signed)
 Intermittent myalgia and sciatica exacerbated by prolonged sitting. Discussed physical therapy and stretching exercises. Considered cyclobenzaprine  for muscle relaxation during flare-ups. - Prescribe cyclobenzaprine  5 mg, to be taken at night as needed. - Advise stretching exercises several times a day. - Apply topical analgesics like Bengay at night if needed.

## 2023-12-21 NOTE — Assessment & Plan Note (Signed)
 Hypercholesterolemia managed with atorvastatin , causing myalgia. Cholesterol levels improved with reduced atorvastatin  frequency. Discussed alternative statins if issues persist. Emphasized importance of aspirin  therapy. - Check cholesterol levels. - Continue atorvastatin  every other day. - Continue aspirin  every other day. - Continue Coenzyme Q10 daily. - Order liver function test. - Order muscle enzyme test.

## 2023-12-21 NOTE — Assessment & Plan Note (Signed)
 Chronic insomnia with limited relief from trazodone. Discussed regular dosing importance. Considered stress and travel as contributing factors. - Continue trazodone with regular nightly dosing. - Consider alternative sleep aids if trazodone remains ineffective.

## 2023-12-21 NOTE — Assessment & Plan Note (Signed)
 Previous vitamin D  levels normal but not optimal. Discussed benefits of supplementation for overall health and muscle symptoms. - Recommend vitamin D  1000-2000 IU daily in gel capsule form.

## 2023-12-21 NOTE — Assessment & Plan Note (Signed)

## 2023-12-21 NOTE — Assessment & Plan Note (Signed)
 Current use of estrogen patch with supply issues. Discussed alternative pharmacies and reducing patch frequency to conserve supply. - Contact smaller pharmacies like [address] [facility] and [facility] for estrogen patch availability. - Reduce patch application to once a week until supply stabilizes.

## 2024-01-29 ENCOUNTER — Other Ambulatory Visit: Payer: Self-pay

## 2024-01-29 DIAGNOSIS — E7800 Pure hypercholesterolemia, unspecified: Secondary | ICD-10-CM

## 2024-01-29 MED ORDER — ROSUVASTATIN CALCIUM 10 MG PO TABS: 10.0000 mg | ORAL_TABLET | Freq: Every day | ORAL | 1 refills | Status: DC

## 2024-02-20 ENCOUNTER — Other Ambulatory Visit: Payer: Self-pay | Admitting: Internal Medicine

## 2024-02-20 DIAGNOSIS — E7800 Pure hypercholesterolemia, unspecified: Secondary | ICD-10-CM

## 2024-02-29 ENCOUNTER — Ambulatory Visit: Payer: Self-pay

## 2024-02-29 NOTE — Telephone Encounter (Signed)
 Patient's PCP Dr Catheryn Slocumb does not have any openings anytime soon--patient is scheduled with Gaines Ada FNP. If there are any sooner appointments or appointments with her PCP Dr Catheryn Plants reach out to patient.    FYI Only or Action Required?: FYI only for provider: appointment scheduled on 03/01/2024 at 12pm with Gaines Ada FNP.  Patient was last seen in primary care on 12/17/2023 by Slocumb Catheryn, MD.  Called Nurse Triage reporting Depression.  Symptoms began after her husband's death 20-Feb-2025.  Interventions attempted: Prescription medications: Trazadone--not helping, Rest, hydration, or home remedies, and Other: herbal teas.  Symptoms are: gradually worsening.  Triage Disposition: See Physician Within 24 Hours (overriding Home Care)  Patient/caregiver understands and will follow disposition?: Yes                Copied from CRM [id number]. Topic: Clinical - Red Word Triage >> Feb 29, 2024  9:23 AM Kevelyn M wrote: Red Word that prompted transfer to Nurse Triage: Patient calling in crying. Patient is not sleeping, experiencing headaches, having issues focusing due to the death of her husband. Husband died unexpectedly. She wants to make an appointment with Dr. Slocumb. Husband died on February 21, 2024. Reason for Disposition  Recent death of a loved one  Answer Assessment - Initial Assessment Questions Patient states that she is having trouble sleeping Her husband passed out Feb 21, 2024 Patient has reached out to a therapist to get that started Patient denies any suicidal thoughts Patient has tried herbal teas October 1st patient was having trouble sleeping--unrelated to this and she was given something to help her sleep at that time Patient states that she has tried the Trazadone she was given them but that doesn't help at all now. Patient denies drinking alcohol, smoking, or using illegal substances  She states she doesn't feel in control  or clear Patient states that her mother is with her and she has been helping her continue her daily routines and patient states that her mother and her sons are not letting her be alone. She is going to have someone with her all day and throughout the night. She states that usually she is very strong and able to get through anything but at this point she is asking her help from her PCP office. Patient's PCP Dr Catheryn Slocumb does not have any openings anytime soon--patient is scheduled with Gaines Ada FNP. There were also no openings at surrounding PCP offices today. If there are any sooner appointments or appointments with her PCP Dr Catheryn Plants reach out to patient. Patient is advised additional resources and advised to call us  back at any point with any changes or further questions/concerns. Patient is also advised that if anything worsens to call 911 or go to the Emergency Room. She verbalized understanding.  Protocols used: Depression-A-AH

## 2024-03-01 ENCOUNTER — Encounter: Payer: Self-pay | Admitting: Nurse Practitioner

## 2024-03-01 ENCOUNTER — Ambulatory Visit (INDEPENDENT_AMBULATORY_CARE_PROVIDER_SITE_OTHER): Admitting: Nurse Practitioner

## 2024-03-01 VITALS — BP 120/60 | HR 80 | Temp 98.1°F | Ht 64.0 in | Wt 178.0 lb

## 2024-03-01 DIAGNOSIS — G4700 Insomnia, unspecified: Secondary | ICD-10-CM

## 2024-03-01 DIAGNOSIS — F4321 Adjustment disorder with depressed mood: Secondary | ICD-10-CM | POA: Diagnosis not present

## 2024-03-01 MED ORDER — CLONAZEPAM 0.5 MG PO TABS: 0.5000 mg | ORAL_TABLET | Freq: Two times a day (BID) | ORAL | 0 refills | Status: AC | PRN

## 2024-03-01 NOTE — Progress Notes (Signed)
 I,Jameka J Llittleton, CMA,acting as a neurosurgeon for [employer], FNP.,have documented all relevant documentation on the behalf of Gaines Ada, FNP,as directed by  Gaines Ada, FNP while in the presence of Gaines Ada, FNP.  Subjective:  Patient ID: Dawn [name] , female    DOB: September 20, 1965 , 59 y.o.   MRN: [id number]  Chief Complaint  Patient presents with   Insomnia    Patient reports she hasn't been able to sleep due to the passing of her husband. She reported she had some left over trazodone  and that is not working. She reports she has been having headaches and normally she doesn't.    HPI  Patient presents today for insomnia. She reports her husband recently passed away suddendly on 12/31/2026and his service was on December 30th. and she has not been able to sleep since. She is having headaches and she is unable to focus. She is only sleeping 3-4 hours per night with Trazadone. She does have family at home with her overnight.   She works for [employer]. She is out until at least next week. Dr. Arland Server - she is scheduled for this week for counseling.      Past Medical History:  Diagnosis Date   Acute meniscal tear of knee LEFT   Arthritis    Asthma    Constipation    Contact lens/glasses fitting    wears contacts or glasses   Environmental allergies    Gallbladder problem    History of gastric ulcer AS TEEN   History of stomach ulcers    History of viral pericarditis PROBABLE OR IDIOPATHIC PER D/C SUMMARY MARCH 2011   NO PROBLEMS SINCE   Joint pain    Lactose intolerance    Multiple food allergies    coconut   Osteoarthritis    Pericarditis    Primary localized osteoarthritis of right knee 11/25/2011   S/P Left Knee arthroscopy performed by Dr. Ernie in April 2013.    Seasonal allergies    Torn rotator cuff      Family History  Problem Relation Age of Onset   Cancer Brother    Asthma Son    Breast cancer Paternal Aunt      Current Medications[1]   Allergies[2]   Review of Systems  Constitutional: Negative.   Respiratory: Negative.    Cardiovascular: Negative.   Neurological: Negative.   Psychiatric/Behavioral: Negative.       Today's Vitals   03/01/24 1243  BP: 120/60  Pulse: 80  Temp: 98.1 F (36.7 C)  TempSrc: Oral  Weight: 178 lb (80.7 kg)  Height: 5' 4 (1.626 m)  PainSc: 0-No pain   Body mass index is 30.55 kg/m.  Wt Readings from Last 3 Encounters:  03/01/24 178 lb (80.7 kg)  12/17/23 174 lb 6.4 oz (79.1 kg)  11/25/23 172 lb (78 kg)      Objective:  Physical Exam Vitals and nursing note reviewed.  Constitutional:      Appearance: Normal appearance.  Cardiovascular:     Rate and Rhythm: Normal rate and regular rhythm.     Pulses: Normal pulses.     Heart sounds: Normal heart sounds. No murmur heard. Pulmonary:     Effort: Pulmonary effort is normal. No respiratory distress.     Breath sounds: Normal breath sounds. No wheezing.  Neurological:     General: No focal deficit present.     Mental Status: She is alert and oriented to person, place,  and time.     Cranial Nerves: No cranial nerve deficit.  Psychiatric:        Mood and Affect: Mood is depressed. Affect is tearful.     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 Reports difficulty sleeping despite trazodone . - Continue trazodone  for sleep. - Prescribed clonazepam  as needed for relaxation and potential sleep improvement. Grief Experiencing symptoms following husband's sudden death. Seeing therapist. Open to medication for symptom management. - Prescribed clonazepam  as needed for relaxation and functionality. - Advised against taking clonazepam  with trazodone  or Flexeril . - Cautioned against driving after clonazepam . - Encouraged follow-up with therapist and consider grief  counseling.  No orders of the defined types were placed in this encounter.   No follow-ups on file.  Patient was given opportunity to ask questions. Patient verbalized understanding of the plan and was able to repeat key elements of the plan. All questions were answered to their satisfaction.    LILLETTE Gaines Ada, FNP, have reviewed all documentation for this visit. The documentation on 03/01/2024 for the exam, diagnosis, procedures, and orders are all accurate and complete.   IF YOU HAVE BEEN REFERRED TO A SPECIALIST, IT MAY TAKE 1-2 WEEKS TO SCHEDULE/PROCESS THE REFERRAL. IF YOU HAVE NOT HEARD FROM US /SPECIALIST IN TWO WEEKS, PLEASE GIVE US  A CALL AT [phone] X [phone].      [1]  Current Outpatient Medications:    albuterol  (VENTOLIN  HFA) 108 (90 Base) MCG/ACT inhaler, Inhale 2 puffs into the lungs every 6 (six) hours as needed for wheezing., Disp: 18 g, Rfl: 5   aspirin  EC 81 MG tablet, Take 1 tablet (81 mg total) by mouth daily. Swallow whole., Disp: , Rfl:    atorvastatin  (LIPITOR) 20 MG tablet, Take 1 tablet (20 mg total) by mouth daily., Disp: 30 tablet, Rfl: 11   b complex vitamins tablet, Take 1 tablet by mouth daily., Disp: , Rfl:    Biotin 5000 MCG CAPS, Take by mouth every morning., Disp: , Rfl:    cetirizine  (ZYRTEC  ALLERGY) 10 MG tablet, Take 1 tablet (10 mg total) by mouth daily., Disp: 30 tablet, Rfl: 0   cyclobenzaprine  (FLEXERIL ) 5 MG tablet, Take 1 tablet (5 mg total) by mouth 3 (three) times daily as needed for muscle spasms., Disp: 30 tablet, Rfl: 0   diclofenac  Sodium (VOLTAREN ) 1 % GEL, Apply 4 g topically 4 (four) times daily., Disp: 100 g, Rfl: 1   EPINEPHrine  0.3 mg/0.3 mL IJ SOAJ injection, Inject 0.3 mg into the muscle as needed., Disp: 1 each, Rfl: 1   estradiol (VIVELLE-DOT) 0.075 MG/24HR, Place 1 patch onto the skin 2 (two) times a week., Disp: , Rfl:    ibuprofen  (ADVIL ) 600 MG tablet, Take 1 tablet (600 mg total) by mouth every 6 (six) hours as needed., Disp:  30 tablet, Rfl: 0   Multiple Vitamins-Minerals (MULTIVITAMIN WITH MINERALS) tablet, Take by mouth daily. , Disp: , Rfl:    pseudoephedrine  (SUDAFED) 30 MG tablet, Take 1 tablet (30 mg total) by mouth every 8 (eight) hours as needed for congestion., Disp: 30 tablet, Rfl: 0   rosuvastatin  (CRESTOR ) 10 MG tablet, TAKE 1 TABLET (10 MG TOTAL) BY MOUTH DAILY. TAKE 1 TABLET DAILY ON MWF., Disp: 90 tablet, Rfl: 1   traZODone  (DESYREL ) 50 MG tablet, Take 1 tablet (50 mg total) by mouth at bedtime as needed for sleep., Disp:  90 tablet, Rfl: 1 [2]  Allergies Allergen Reactions   Coconut (Cocos Nucifera) Anaphylaxis, Shortness Of Breath and Swelling    Coconut   Coconut Oil Anaphylaxis, Other (See Comments), Shortness Of Breath and Swelling    Coconut   Latex Swelling, Rash, Hives, Itching and Other (See Comments)    AND BLISTERS   Sulfa Antibiotics Anaphylaxis and Swelling

## 2024-03-03 ENCOUNTER — Encounter: Payer: Self-pay | Admitting: Nurse Practitioner

## 2024-03-21 ENCOUNTER — Encounter: Payer: Self-pay | Admitting: Internal Medicine

## 2024-04-12 ENCOUNTER — Ambulatory Visit: Payer: Self-pay | Admitting: Internal Medicine

## 2024-06-02 ENCOUNTER — Ambulatory Visit: Payer: Self-pay | Admitting: Internal Medicine

## 2024-12-26 ENCOUNTER — Encounter: Payer: Self-pay | Admitting: Internal Medicine
